# Patient Record
Sex: Female | Born: 1950 | Race: Black or African American | Hispanic: No | Marital: Married | Smoking: Never smoker
Health system: Southern US, Community
[De-identification: ages and names within clinical notes are randomized; demographics above are authoritative.]

## PROBLEM LIST (undated history)

## (undated) DIAGNOSIS — R350 Frequency of micturition: Secondary | ICD-10-CM

## (undated) DIAGNOSIS — R112 Nausea with vomiting, unspecified: Secondary | ICD-10-CM

## (undated) DIAGNOSIS — B370 Candidal stomatitis: Secondary | ICD-10-CM

## (undated) DIAGNOSIS — Z923 Personal history of irradiation: Secondary | ICD-10-CM

## (undated) DIAGNOSIS — R809 Proteinuria, unspecified: Secondary | ICD-10-CM

## (undated) DIAGNOSIS — M7071 Other bursitis of hip, right hip: Secondary | ICD-10-CM

## (undated) DIAGNOSIS — E7800 Pure hypercholesterolemia, unspecified: Secondary | ICD-10-CM

## (undated) DIAGNOSIS — C801 Malignant (primary) neoplasm, unspecified: Secondary | ICD-10-CM

## (undated) DIAGNOSIS — C50919 Malignant neoplasm of unspecified site of unspecified female breast: Secondary | ICD-10-CM

## (undated) DIAGNOSIS — M25561 Pain in right knee: Secondary | ICD-10-CM

## (undated) DIAGNOSIS — E876 Hypokalemia: Secondary | ICD-10-CM

## (undated) DIAGNOSIS — I10 Essential (primary) hypertension: Secondary | ICD-10-CM

## (undated) DIAGNOSIS — E119 Type 2 diabetes mellitus without complications: Secondary | ICD-10-CM

## (undated) DIAGNOSIS — E2839 Other primary ovarian failure: Secondary | ICD-10-CM

## (undated) DIAGNOSIS — Z9889 Other specified postprocedural states: Secondary | ICD-10-CM

## (undated) DIAGNOSIS — R011 Cardiac murmur, unspecified: Secondary | ICD-10-CM

## (undated) DIAGNOSIS — T4145XA Adverse effect of unspecified anesthetic, initial encounter: Secondary | ICD-10-CM

## (undated) DIAGNOSIS — J309 Allergic rhinitis, unspecified: Secondary | ICD-10-CM

## (undated) DIAGNOSIS — R5383 Other fatigue: Secondary | ICD-10-CM

## (undated) DIAGNOSIS — M25512 Pain in left shoulder: Secondary | ICD-10-CM

## (undated) DIAGNOSIS — M79672 Pain in left foot: Secondary | ICD-10-CM

## (undated) DIAGNOSIS — M722 Plantar fascial fibromatosis: Secondary | ICD-10-CM

## (undated) DIAGNOSIS — M545 Low back pain, unspecified: Secondary | ICD-10-CM

## (undated) DIAGNOSIS — M1990 Unspecified osteoarthritis, unspecified site: Secondary | ICD-10-CM

## (undated) DIAGNOSIS — N189 Chronic kidney disease, unspecified: Secondary | ICD-10-CM

## (undated) DIAGNOSIS — R42 Dizziness and giddiness: Secondary | ICD-10-CM

## (undated) DIAGNOSIS — H6120 Impacted cerumen, unspecified ear: Secondary | ICD-10-CM

## (undated) DIAGNOSIS — T8859XA Other complications of anesthesia, initial encounter: Secondary | ICD-10-CM

## (undated) DIAGNOSIS — M109 Gout, unspecified: Secondary | ICD-10-CM

## (undated) HISTORY — DX: Dizziness and giddiness: R42

## (undated) HISTORY — DX: Allergic rhinitis, unspecified: J30.9

## (undated) HISTORY — DX: Proteinuria, unspecified: R80.9

## (undated) HISTORY — DX: Pain in right knee: M25.561

## (undated) HISTORY — DX: Other primary ovarian failure: E28.39

## (undated) HISTORY — DX: Low back pain, unspecified: M54.50

## (undated) HISTORY — DX: Other bursitis of hip, right hip: M70.71

## (undated) HISTORY — PX: JOINT REPLACEMENT: SHX530

## (undated) HISTORY — PX: TONSILLECTOMY: SUR1361

## (undated) HISTORY — PX: COLONOSCOPY: SHX174

## (undated) HISTORY — DX: Frequency of micturition: R35.0

## (undated) HISTORY — DX: Other fatigue: R53.83

## (undated) HISTORY — DX: Pain in left shoulder: M25.512

## (undated) HISTORY — PX: BREAST SURGERY: SHX581

## (undated) HISTORY — DX: Impacted cerumen, unspecified ear: H61.20

## (undated) HISTORY — DX: Hypokalemia: E87.6

## (undated) HISTORY — DX: Pure hypercholesterolemia, unspecified: E78.00

## (undated) HISTORY — DX: Low back pain: M54.5

## (undated) HISTORY — PX: APPENDECTOMY: SHX54

## (undated) HISTORY — PX: TUBAL LIGATION: SHX77

## (undated) HISTORY — PX: CHOLECYSTECTOMY: SHX55

## (undated) HISTORY — DX: Plantar fascial fibromatosis: M72.2

## (undated) HISTORY — DX: Pain in left foot: M79.672

## (undated) HISTORY — PX: ABDOMINAL HYSTERECTOMY: SHX81

## (undated) HISTORY — DX: Candidal stomatitis: B37.0

---

## 1997-10-11 HISTORY — PX: LAPAROSCOPY: SHX197

## 2004-06-01 ENCOUNTER — Encounter (HOSPITAL_COMMUNITY): Admission: RE | Admit: 2004-06-01 | Discharge: 2004-06-16 | Payer: Self-pay | Admitting: General Surgery

## 2004-12-01 ENCOUNTER — Ambulatory Visit: Admission: RE | Admit: 2004-12-01 | Discharge: 2005-02-02 | Payer: Self-pay | Admitting: Radiation Oncology

## 2013-10-11 HISTORY — PX: BREAST LUMPECTOMY: SHX2

## 2015-10-14 DIAGNOSIS — E2839 Other primary ovarian failure: Secondary | ICD-10-CM | POA: Diagnosis not present

## 2015-10-21 DIAGNOSIS — E1165 Type 2 diabetes mellitus with hyperglycemia: Secondary | ICD-10-CM | POA: Diagnosis not present

## 2015-10-21 DIAGNOSIS — Z6833 Body mass index (BMI) 33.0-33.9, adult: Secondary | ICD-10-CM | POA: Diagnosis not present

## 2015-10-21 DIAGNOSIS — M76899 Other specified enthesopathies of unspecified lower limb, excluding foot: Secondary | ICD-10-CM | POA: Diagnosis not present

## 2015-10-21 DIAGNOSIS — E7800 Pure hypercholesterolemia, unspecified: Secondary | ICD-10-CM | POA: Diagnosis not present

## 2015-10-21 DIAGNOSIS — Z789 Other specified health status: Secondary | ICD-10-CM | POA: Diagnosis not present

## 2015-12-05 DIAGNOSIS — E7800 Pure hypercholesterolemia, unspecified: Secondary | ICD-10-CM | POA: Diagnosis not present

## 2015-12-05 DIAGNOSIS — R6889 Other general symptoms and signs: Secondary | ICD-10-CM | POA: Diagnosis not present

## 2015-12-05 DIAGNOSIS — J029 Acute pharyngitis, unspecified: Secondary | ICD-10-CM | POA: Diagnosis not present

## 2015-12-18 DIAGNOSIS — R350 Frequency of micturition: Secondary | ICD-10-CM | POA: Diagnosis not present

## 2015-12-18 DIAGNOSIS — M7061 Trochanteric bursitis, right hip: Secondary | ICD-10-CM | POA: Diagnosis not present

## 2015-12-18 DIAGNOSIS — M47814 Spondylosis without myelopathy or radiculopathy, thoracic region: Secondary | ICD-10-CM | POA: Diagnosis not present

## 2015-12-18 DIAGNOSIS — M4316 Spondylolisthesis, lumbar region: Secondary | ICD-10-CM | POA: Diagnosis not present

## 2015-12-18 DIAGNOSIS — M549 Dorsalgia, unspecified: Secondary | ICD-10-CM | POA: Diagnosis not present

## 2015-12-18 DIAGNOSIS — N182 Chronic kidney disease, stage 2 (mild): Secondary | ICD-10-CM | POA: Diagnosis not present

## 2015-12-18 DIAGNOSIS — M5410 Radiculopathy, site unspecified: Secondary | ICD-10-CM | POA: Diagnosis not present

## 2015-12-18 DIAGNOSIS — I10 Essential (primary) hypertension: Secondary | ICD-10-CM | POA: Diagnosis not present

## 2015-12-18 DIAGNOSIS — M160 Bilateral primary osteoarthritis of hip: Secondary | ICD-10-CM | POA: Diagnosis not present

## 2015-12-18 DIAGNOSIS — M5116 Intervertebral disc disorders with radiculopathy, lumbar region: Secondary | ICD-10-CM | POA: Diagnosis not present

## 2015-12-18 DIAGNOSIS — E1165 Type 2 diabetes mellitus with hyperglycemia: Secondary | ICD-10-CM | POA: Diagnosis not present

## 2016-01-26 DIAGNOSIS — I10 Essential (primary) hypertension: Secondary | ICD-10-CM | POA: Diagnosis not present

## 2016-01-26 DIAGNOSIS — M1710 Unilateral primary osteoarthritis, unspecified knee: Secondary | ICD-10-CM | POA: Diagnosis not present

## 2016-01-26 DIAGNOSIS — R202 Paresthesia of skin: Secondary | ICD-10-CM | POA: Diagnosis not present

## 2016-01-26 DIAGNOSIS — E1165 Type 2 diabetes mellitus with hyperglycemia: Secondary | ICD-10-CM | POA: Diagnosis not present

## 2016-01-26 DIAGNOSIS — M549 Dorsalgia, unspecified: Secondary | ICD-10-CM | POA: Diagnosis not present

## 2016-02-03 DIAGNOSIS — E7800 Pure hypercholesterolemia, unspecified: Secondary | ICD-10-CM | POA: Diagnosis not present

## 2016-02-03 DIAGNOSIS — I10 Essential (primary) hypertension: Secondary | ICD-10-CM | POA: Diagnosis not present

## 2016-02-03 DIAGNOSIS — E119 Type 2 diabetes mellitus without complications: Secondary | ICD-10-CM | POA: Diagnosis not present

## 2016-02-03 DIAGNOSIS — I2510 Atherosclerotic heart disease of native coronary artery without angina pectoris: Secondary | ICD-10-CM | POA: Diagnosis not present

## 2016-02-10 DIAGNOSIS — M5431 Sciatica, right side: Secondary | ICD-10-CM | POA: Diagnosis not present

## 2016-02-10 DIAGNOSIS — M545 Low back pain: Secondary | ICD-10-CM | POA: Diagnosis not present

## 2016-02-10 DIAGNOSIS — M549 Dorsalgia, unspecified: Secondary | ICD-10-CM | POA: Diagnosis not present

## 2016-02-12 DIAGNOSIS — M5431 Sciatica, right side: Secondary | ICD-10-CM | POA: Diagnosis not present

## 2016-02-12 DIAGNOSIS — M545 Low back pain: Secondary | ICD-10-CM | POA: Diagnosis not present

## 2016-02-12 DIAGNOSIS — M549 Dorsalgia, unspecified: Secondary | ICD-10-CM | POA: Diagnosis not present

## 2016-02-16 DIAGNOSIS — M549 Dorsalgia, unspecified: Secondary | ICD-10-CM | POA: Diagnosis not present

## 2016-02-16 DIAGNOSIS — M545 Low back pain: Secondary | ICD-10-CM | POA: Diagnosis not present

## 2016-02-16 DIAGNOSIS — M5431 Sciatica, right side: Secondary | ICD-10-CM | POA: Diagnosis not present

## 2016-02-18 DIAGNOSIS — M549 Dorsalgia, unspecified: Secondary | ICD-10-CM | POA: Diagnosis not present

## 2016-02-18 DIAGNOSIS — M5431 Sciatica, right side: Secondary | ICD-10-CM | POA: Diagnosis not present

## 2016-02-18 DIAGNOSIS — M545 Low back pain: Secondary | ICD-10-CM | POA: Diagnosis not present

## 2016-02-20 DIAGNOSIS — M549 Dorsalgia, unspecified: Secondary | ICD-10-CM | POA: Diagnosis not present

## 2016-02-20 DIAGNOSIS — M545 Low back pain: Secondary | ICD-10-CM | POA: Diagnosis not present

## 2016-02-20 DIAGNOSIS — M5431 Sciatica, right side: Secondary | ICD-10-CM | POA: Diagnosis not present

## 2016-02-24 DIAGNOSIS — H43393 Other vitreous opacities, bilateral: Secondary | ICD-10-CM | POA: Diagnosis not present

## 2016-02-25 DIAGNOSIS — M545 Low back pain: Secondary | ICD-10-CM | POA: Diagnosis not present

## 2016-02-25 DIAGNOSIS — M549 Dorsalgia, unspecified: Secondary | ICD-10-CM | POA: Diagnosis not present

## 2016-02-25 DIAGNOSIS — M5431 Sciatica, right side: Secondary | ICD-10-CM | POA: Diagnosis not present

## 2016-03-01 DIAGNOSIS — M545 Low back pain: Secondary | ICD-10-CM | POA: Diagnosis not present

## 2016-03-01 DIAGNOSIS — E1122 Type 2 diabetes mellitus with diabetic chronic kidney disease: Secondary | ICD-10-CM | POA: Diagnosis not present

## 2016-03-01 DIAGNOSIS — N183 Chronic kidney disease, stage 3 (moderate): Secondary | ICD-10-CM | POA: Diagnosis not present

## 2016-03-01 DIAGNOSIS — Z299 Encounter for prophylactic measures, unspecified: Secondary | ICD-10-CM | POA: Diagnosis not present

## 2016-03-03 DIAGNOSIS — M545 Low back pain: Secondary | ICD-10-CM | POA: Diagnosis not present

## 2016-03-03 DIAGNOSIS — M5431 Sciatica, right side: Secondary | ICD-10-CM | POA: Diagnosis not present

## 2016-03-03 DIAGNOSIS — M549 Dorsalgia, unspecified: Secondary | ICD-10-CM | POA: Diagnosis not present

## 2016-03-04 DIAGNOSIS — M4806 Spinal stenosis, lumbar region: Secondary | ICD-10-CM | POA: Diagnosis not present

## 2016-03-04 DIAGNOSIS — M4316 Spondylolisthesis, lumbar region: Secondary | ICD-10-CM | POA: Diagnosis not present

## 2016-03-09 DIAGNOSIS — M549 Dorsalgia, unspecified: Secondary | ICD-10-CM | POA: Diagnosis not present

## 2016-03-09 DIAGNOSIS — M1710 Unilateral primary osteoarthritis, unspecified knee: Secondary | ICD-10-CM | POA: Diagnosis not present

## 2016-03-26 DIAGNOSIS — M170 Bilateral primary osteoarthritis of knee: Secondary | ICD-10-CM | POA: Diagnosis not present

## 2016-03-31 DIAGNOSIS — I10 Essential (primary) hypertension: Secondary | ICD-10-CM | POA: Diagnosis not present

## 2016-03-31 DIAGNOSIS — I2510 Atherosclerotic heart disease of native coronary artery without angina pectoris: Secondary | ICD-10-CM | POA: Diagnosis not present

## 2016-03-31 DIAGNOSIS — E7800 Pure hypercholesterolemia, unspecified: Secondary | ICD-10-CM | POA: Diagnosis not present

## 2016-03-31 DIAGNOSIS — E119 Type 2 diabetes mellitus without complications: Secondary | ICD-10-CM | POA: Diagnosis not present

## 2016-04-14 DIAGNOSIS — E1122 Type 2 diabetes mellitus with diabetic chronic kidney disease: Secondary | ICD-10-CM | POA: Diagnosis not present

## 2016-04-14 DIAGNOSIS — N183 Chronic kidney disease, stage 3 (moderate): Secondary | ICD-10-CM | POA: Diagnosis not present

## 2016-04-14 DIAGNOSIS — M1710 Unilateral primary osteoarthritis, unspecified knee: Secondary | ICD-10-CM | POA: Diagnosis not present

## 2016-04-14 DIAGNOSIS — Z299 Encounter for prophylactic measures, unspecified: Secondary | ICD-10-CM | POA: Diagnosis not present

## 2016-04-14 DIAGNOSIS — I10 Essential (primary) hypertension: Secondary | ICD-10-CM | POA: Diagnosis not present

## 2016-04-15 ENCOUNTER — Ambulatory Visit: Payer: Self-pay | Admitting: Orthopedic Surgery

## 2016-04-22 DIAGNOSIS — M1711 Unilateral primary osteoarthritis, right knee: Secondary | ICD-10-CM | POA: Diagnosis not present

## 2016-04-27 ENCOUNTER — Ambulatory Visit: Payer: Self-pay | Admitting: Orthopedic Surgery

## 2016-04-27 NOTE — H&P (Signed)
TOTAL KNEE ADMISSION H&P  Patient is being admitted for right total knee arthroplasty.  Subjective:  Chief Complaint:right knee pain.  HPI: Carol Keith, 65 y.o. female, has a history of pain and functional disability in the right knee due to arthritis and has failed non-surgical conservative treatments for greater than 12 weeks to includeNSAID's and/or analgesics, corticosteriod injections, flexibility and strengthening excercises, use of assistive devices, weight reduction as appropriate and activity modification.  Onset of symptoms was gradual, starting >10 years ago with gradually worsening course since that time. The patient noted no past surgery on the right knee(s).  Patient currently rates pain in the bilaterally knee(s) at 10 out of 10 with activity. Patient has night pain, worsening of pain with activity and weight bearing, pain that interferes with activities of daily living, pain with passive range of motion, crepitus and joint swelling.  Patient has evidence of subchondral cysts, subchondral sclerosis, periarticular osteophytes and joint space narrowing by imaging studies. There is no active infection.  There are no active problems to display for this patient.  No past medical history on file.  No past surgical history on file.   (Not in a hospital admission) Allergies not on file  Social History  Substance Use Topics  . Smoking status: Not on file  . Smokeless tobacco: Not on file  . Alcohol Use: Not on file    No family history on file.   Review of Systems  Constitutional: Positive for diaphoresis.  Eyes: Positive for blurred vision.  Respiratory: Negative.   Cardiovascular: Negative.   Gastrointestinal: Negative.   Genitourinary: Negative.   Musculoskeletal: Positive for back pain and joint pain.  Skin: Negative.   Neurological: Positive for dizziness.  Endo/Heme/Allergies: Negative.   Psychiatric/Behavioral: Negative.     Objective:  Physical Exam   Vitals reviewed. Constitutional: She is oriented to person, place, and time. She appears well-developed and well-nourished.  HENT:  Head: Normocephalic and atraumatic.  Eyes: Conjunctivae and EOM are normal. Pupils are equal, round, and reactive to light.  Neck: Normal range of motion. Neck supple.  Cardiovascular: Normal rate and regular rhythm.   Respiratory: Effort normal. No respiratory distress.  GI: Soft. Bowel sounds are normal. She exhibits no distension.  Genitourinary:  deferred  Musculoskeletal:       Right knee: She exhibits swelling, effusion and deformity. Tenderness found. Medial joint line tenderness noted.  ROM 15-105  Neurological: She is alert and oriented to person, place, and time. She has normal reflexes.  Skin: Skin is warm.  Psychiatric: She has a normal mood and affect. Her behavior is normal. Judgment and thought content normal.    Vital signs in last 24 hours: @VSRANGES @  Labs:   There is no height or weight on file to calculate BMI.   Imaging Review Plain radiographs demonstrate severe degenerative joint disease of the bilaterally knee(s). The overall alignment issignificant varus. The bone quality appears to be adequate for age and reported activity level.  Assessment/Plan:  End stage arthritis, right knee   The patient history, physical examination, clinical judgment of the provider and imaging studies are consistent with end stage degenerative joint disease of the right knee(s) and total knee arthroplasty is deemed medically necessary. The treatment options including medical management, injection therapy arthroscopy and arthroplasty were discussed at length. The risks and benefits of total knee arthroplasty were presented and reviewed. The risks due to aseptic loosening, infection, stiffness, patella tracking problems, thromboembolic complications and other imponderables were discussed. The patient  acknowledged the explanation, agreed to proceed  with the plan and consent was signed. Patient is being admitted for inpatient treatment for surgery, pain control, PT, OT, prophylactic antibiotics, VTE prophylaxis, progressive ambulation and ADL's and discharge planning. The patient is planning to be discharged home with home health services

## 2016-04-29 DIAGNOSIS — I10 Essential (primary) hypertension: Secondary | ICD-10-CM | POA: Diagnosis not present

## 2016-04-29 DIAGNOSIS — E1122 Type 2 diabetes mellitus with diabetic chronic kidney disease: Secondary | ICD-10-CM | POA: Diagnosis not present

## 2016-04-29 DIAGNOSIS — N183 Chronic kidney disease, stage 3 (moderate): Secondary | ICD-10-CM | POA: Diagnosis not present

## 2016-05-06 ENCOUNTER — Encounter (HOSPITAL_COMMUNITY)
Admission: RE | Admit: 2016-05-06 | Discharge: 2016-05-06 | Disposition: A | Discharge status: Home / Self Care | Payer: Medicare Other | Source: Ambulatory Visit | Attending: Orthopedic Surgery | Admitting: Orthopedic Surgery

## 2016-05-06 ENCOUNTER — Encounter (HOSPITAL_COMMUNITY): Payer: Self-pay

## 2016-05-06 ENCOUNTER — Other Ambulatory Visit: Payer: Self-pay

## 2016-05-06 DIAGNOSIS — Z0183 Encounter for blood typing: Secondary | ICD-10-CM | POA: Diagnosis not present

## 2016-05-06 DIAGNOSIS — Z79899 Other long term (current) drug therapy: Secondary | ICD-10-CM | POA: Insufficient documentation

## 2016-05-06 DIAGNOSIS — I129 Hypertensive chronic kidney disease with stage 1 through stage 4 chronic kidney disease, or unspecified chronic kidney disease: Secondary | ICD-10-CM | POA: Diagnosis not present

## 2016-05-06 DIAGNOSIS — Z853 Personal history of malignant neoplasm of breast: Secondary | ICD-10-CM | POA: Insufficient documentation

## 2016-05-06 DIAGNOSIS — Z01812 Encounter for preprocedural laboratory examination: Secondary | ICD-10-CM | POA: Insufficient documentation

## 2016-05-06 DIAGNOSIS — N189 Chronic kidney disease, unspecified: Secondary | ICD-10-CM | POA: Insufficient documentation

## 2016-05-06 DIAGNOSIS — E1122 Type 2 diabetes mellitus with diabetic chronic kidney disease: Secondary | ICD-10-CM | POA: Diagnosis not present

## 2016-05-06 DIAGNOSIS — Z7982 Long term (current) use of aspirin: Secondary | ICD-10-CM | POA: Diagnosis not present

## 2016-05-06 DIAGNOSIS — M1711 Unilateral primary osteoarthritis, right knee: Secondary | ICD-10-CM | POA: Diagnosis not present

## 2016-05-06 DIAGNOSIS — Z794 Long term (current) use of insulin: Secondary | ICD-10-CM | POA: Insufficient documentation

## 2016-05-06 DIAGNOSIS — Z01818 Encounter for other preprocedural examination: Secondary | ICD-10-CM | POA: Diagnosis not present

## 2016-05-06 HISTORY — DX: Other specified postprocedural states: R11.2

## 2016-05-06 HISTORY — DX: Malignant (primary) neoplasm, unspecified: C80.1

## 2016-05-06 HISTORY — DX: Unspecified osteoarthritis, unspecified site: M19.90

## 2016-05-06 HISTORY — DX: Other complications of anesthesia, initial encounter: T88.59XA

## 2016-05-06 HISTORY — DX: Cardiac murmur, unspecified: R01.1

## 2016-05-06 HISTORY — DX: Chronic kidney disease, unspecified: N18.9

## 2016-05-06 HISTORY — DX: Type 2 diabetes mellitus without complications: E11.9

## 2016-05-06 HISTORY — DX: Adverse effect of unspecified anesthetic, initial encounter: T41.45XA

## 2016-05-06 HISTORY — DX: Other specified postprocedural states: Z98.890

## 2016-05-06 HISTORY — DX: Essential (primary) hypertension: I10

## 2016-05-06 LAB — CBC
HEMATOCRIT: 34.1 % — AB (ref 36.0–46.0)
Hemoglobin: 11.4 g/dL — ABNORMAL LOW (ref 12.0–15.0)
MCH: 28.8 pg (ref 26.0–34.0)
MCHC: 33.4 g/dL (ref 30.0–36.0)
MCV: 86.1 fL (ref 78.0–100.0)
PLATELETS: 267 10*3/uL (ref 150–400)
RBC: 3.96 MIL/uL (ref 3.87–5.11)
RDW: 13.3 % (ref 11.5–15.5)
WBC: 6.3 10*3/uL (ref 4.0–10.5)

## 2016-05-06 LAB — BASIC METABOLIC PANEL
Anion gap: 8 (ref 5–15)
BUN: 25 mg/dL — AB (ref 6–20)
CHLORIDE: 103 mmol/L (ref 101–111)
CO2: 29 mmol/L (ref 22–32)
CREATININE: 1.29 mg/dL — AB (ref 0.44–1.00)
Calcium: 10.3 mg/dL (ref 8.9–10.3)
GFR calc non Af Amer: 42 mL/min — ABNORMAL LOW (ref >60)
GFR, EST AFRICAN AMERICAN: 49 mL/min — AB (ref >60)
Glucose, Bld: 166 mg/dL — ABNORMAL HIGH (ref 65–99)
POTASSIUM: 2.7 mmol/L — AB (ref 3.5–5.1)
Sodium: 140 mmol/L (ref 135–145)

## 2016-05-06 LAB — TYPE AND SCREEN
ABO/RH(D): O POS
ANTIBODY SCREEN: NEGATIVE

## 2016-05-06 LAB — ABO/RH: ABO/RH(D): O POS

## 2016-05-06 LAB — SURGICAL PCR SCREEN
MRSA, PCR: NEGATIVE
Staphylococcus aureus: NEGATIVE

## 2016-05-06 LAB — GLUCOSE, CAPILLARY: Glucose-Capillary: 186 mg/dL — ABNORMAL HIGH (ref 65–99)

## 2016-05-06 NOTE — Progress Notes (Signed)
Pt. Reports that she had an ECHO relative to a heart murmur & rec'ing chemo.  Pt. Told that the results were normal & there was no need to followup with cardiology

## 2016-05-06 NOTE — Pre-Procedure Instructions (Signed)
Carol Keith  05/06/2016     No Pharmacies Listed   Your procedure is scheduled on 05/17/2016   MONDAY  Report to [facility]  Admitting at 9:45 A.M.  Call this number if you have problems the morning of surgery:  [phone]   Remember:  Do not eat food or drink liquids after midnight.  Take these medicines the morning of surgery with A SIP OF WATER : Amlodipine, Zyrtec & Metoprolol    Do not wear jewelry, make-up or nail polish.   Do not wear lotions, powders, or perfumes.  You may wear deoderant.   Do not shave 48 hours prior to surgery.     Do not bring valuables to the hospital.   [facility] is not responsible for any belongings or valuables.  Contacts, dentures or bridgework may not be worn into surgery.  Leave your suitcase in the car.  After surgery it may be brought to your room.  For patients admitted to the hospital, discharge time will be determined by your treatment team.  Patients discharged the day of surgery will not be allowed to drive home.   Name and phone number of your driver:   With family  Special instructions:     How to Manage Your Diabetes Before and After Surgery  Why is it important to control my blood sugar before and after surgery? . Improving blood sugar levels before and after surgery helps healing and can limit problems. . A way of improving blood sugar control is eating a healthy diet by: o  Eating less sugar and carbohydrates o  Increasing activity/exercise o  Talking with your doctor about reaching your blood sugar goals . High blood sugars (greater than 180 mg/dL) can raise your risk of infections and slow your recovery, so you will need to focus on controlling your diabetes during the weeks before surgery. . Make sure that the doctor who takes care of your diabetes knows about your planned surgery including the date and location.  How do I manage my blood sugar before surgery? . Check your blood sugar at least  4 times a day, starting 2 days before surgery, to make sure that the level is not too high or low. o Check your blood sugar the morning of your surgery when you wake up and every 2 hours until you get to the Short Stay unit. . If your blood sugar is less than 70 mg/dL, you will need to treat for low blood sugar: o Do not take insulin. o Treat a low blood sugar (less than 70 mg/dL) with  cup of clear juice (cranberry or apple), 4 glucose tablets, OR glucose gel. o Recheck blood sugar in 15 minutes after treatment (to make sure it is greater than 70 mg/dL). If your blood sugar is not greater than 70 mg/dL on recheck, call [phone] for further instructions. . Report your blood sugar to the short stay nurse when you get to Short Stay.  . If you are admitted to the hospital after surgery: o Your blood sugar will be checked by the staff and you will probably be given insulin after surgery (instead of oral diabetes medicines) to make sure you have good blood sugar levels. o The goal for blood sugar control after surgery is 80-180 mg/dL.              WHAT DO I DO ABOUT MY DIABETES MEDICATION?   Marland Kitchen Do not take oral diabetes medicines (pills) the  morning of surgery.   DO NOT TAKE ANY BYETTA THE DAY OF SURGERY .  . THE NIGHT BEFORE SURGERY, take _22__________ units of ___LEVEMIR________insulin.       .   . The day of surgery, do not take other diabetes injectables, including Byetta (exenatide), Bydureon (exenatide ER), Victoza (liraglutide), or Trulicity (dulaglutide).  .   Other Instructions: Special Instructions: [place] - Preparing for Surgery  Before surgery, you can play an important role.  Because skin is not sterile, your skin needs to be as free of germs as possible.  You can reduce the number of germs on you skin by washing with CHG (chlorahexidine gluconate) soap before surgery.  CHG is an antiseptic cleaner which kills germs and bonds with the skin to continue killing  germs even after washing.  Please DO NOT use if you have an allergy to CHG or antibacterial soaps.  If your skin becomes reddened/irritated stop using the CHG and inform your nurse when you arrive at Short Stay.  Do not shave (including legs and underarms) for at least 48 hours prior to the first CHG shower.  You may shave your face.  Please follow these instructions carefully:   1.  Shower with CHG Soap the night before surgery and the  morning of Surgery.  2.  If you choose to wash your hair, wash your hair first as usual with your  normal shampoo.  3.  After you shampoo, rinse your hair and body thoroughly to remove the  Shampoo.  4.  Use CHG as you would any other liquid soap.  You can apply chg directly to the skin and wash gently with scrungie or a clean washcloth.  5.  Apply the CHG Soap to your body ONLY FROM THE NECK DOWN.    Do not use on open wounds or open sores.  Avoid contact with your eyes, ears, mouth and genitals (private parts).  Wash genitals (private parts)   with your normal soap.  6.  Wash thoroughly, paying special attention to the area where your surgery will be performed.  7.  Thoroughly rinse your body with warm water from the neck down.  8.  DO NOT shower/wash with your normal soap after using and rinsing off   the CHG Soap.  9.  Pat yourself dry with a clean towel.            10.  Wear clean pajamas.            11.  Place clean sheets on your bed the night of your first shower and do not sleep with pets.  Day of Surgery  Do not apply any lotions/deodorants the morning of surgery.  Please wear clean clothes to the hospital/surgery center.          Patient Signature:  Date:   Nurse Signature:  Date:   Reviewed and Endorsed by [facility] Patient Education Committee, August 2015  Please read over the following fact sheets that you were given. Pain Booklet, Coughing and Deep Breathing, Total Joint Packet, MRSA Information and Surgical Site Infection  Prevention

## 2016-05-06 NOTE — Progress Notes (Signed)
Pt. Reports that Dr. Manuella Ghazi in [place] is her PCP.  Pt. Is requesting  for kidney consult in early Aug. Upon her request, she will be seeing Dr. Berton Bon. Pt. Denies cardiac needs or pulm. needs, she reports that she had an ECHO while or before Chemo tx in 2005 & it was normal.  She thinks she had it at [facility].

## 2016-05-07 DIAGNOSIS — M47816 Spondylosis without myelopathy or radiculopathy, lumbar region: Secondary | ICD-10-CM | POA: Diagnosis not present

## 2016-05-07 DIAGNOSIS — M4806 Spinal stenosis, lumbar region: Secondary | ICD-10-CM | POA: Diagnosis not present

## 2016-05-07 DIAGNOSIS — M4317 Spondylolisthesis, lumbosacral region: Secondary | ICD-10-CM | POA: Diagnosis not present

## 2016-05-07 LAB — HEMOGLOBIN A1C
HEMOGLOBIN A1C: 7.2 % — AB (ref 4.8–5.6)
Mean Plasma Glucose: 160 mg/dL

## 2016-05-07 NOTE — Progress Notes (Addendum)
I called a potassium of 2.7 to Dr Lyla Glassing, Message was given to Santiago Glad- call center. I requested reports from PCP- Dr Manuella Ghazi in [place], [place].

## 2016-05-10 DIAGNOSIS — I739 Peripheral vascular disease, unspecified: Secondary | ICD-10-CM | POA: Diagnosis not present

## 2016-05-10 DIAGNOSIS — E876 Hypokalemia: Secondary | ICD-10-CM | POA: Diagnosis not present

## 2016-05-10 DIAGNOSIS — C50919 Malignant neoplasm of unspecified site of unspecified female breast: Secondary | ICD-10-CM | POA: Diagnosis not present

## 2016-05-10 NOTE — Progress Notes (Addendum)
Anesthesia Chart Review:  Pt is a 65 year old female scheduled for R total knee arthroplasty on 05/17/2016 with Dot Lanes, MD.   PCP is Monico Blitz, MD who has cleared pt for surgery.   PMH includes:  HTN, heart murmur, DM, CKD, breast cancer, post-op N/V. Never smoker. BMI 32.5  Medications include: amlodipine, ASA, exenatide, glimepiride, levemir, losartan-hctz, metoprolol, potassium  Preoperative labs reviewed.   - HgbA1c 7.2, glucose 166.  - K 2.7. Dr. Noralyn Pick office was notified by PAT RN, potassium dose was increased. Will recheck K DOS.   EKG 05/06/16: NSR.   If labs acceptable DOS, I anticipate pt can proceed as scheduled.   Willeen Cass, FNP-BC [facility]/Anesthesiology Phone: [phone] 05/11/2016 12:51 PM

## 2016-05-11 DIAGNOSIS — E876 Hypokalemia: Secondary | ICD-10-CM | POA: Diagnosis not present

## 2016-05-11 DIAGNOSIS — I10 Essential (primary) hypertension: Secondary | ICD-10-CM | POA: Diagnosis not present

## 2016-05-11 DIAGNOSIS — E1129 Type 2 diabetes mellitus with other diabetic kidney complication: Secondary | ICD-10-CM | POA: Diagnosis not present

## 2016-05-11 DIAGNOSIS — R609 Edema, unspecified: Secondary | ICD-10-CM | POA: Diagnosis not present

## 2016-05-11 DIAGNOSIS — N179 Acute kidney failure, unspecified: Secondary | ICD-10-CM | POA: Diagnosis not present

## 2016-05-11 DIAGNOSIS — M109 Gout, unspecified: Secondary | ICD-10-CM | POA: Diagnosis not present

## 2016-05-14 DIAGNOSIS — Z79899 Other long term (current) drug therapy: Secondary | ICD-10-CM | POA: Diagnosis not present

## 2016-05-14 DIAGNOSIS — I129 Hypertensive chronic kidney disease with stage 1 through stage 4 chronic kidney disease, or unspecified chronic kidney disease: Secondary | ICD-10-CM | POA: Diagnosis not present

## 2016-05-14 DIAGNOSIS — N182 Chronic kidney disease, stage 2 (mild): Secondary | ICD-10-CM | POA: Diagnosis not present

## 2016-05-16 MED ORDER — TRANEXAMIC ACID 1000 MG/10ML IV SOLN
1000.0000 mg | INTRAVENOUS | Status: AC
  Administered 2016-05-17: 1000 mg via INTRAVENOUS
  Filled 2016-05-16: qty 10

## 2016-05-16 NOTE — Anesthesia Preprocedure Evaluation (Addendum)
Anesthesia Evaluation  Patient identified by MRN, date of birth, ID band Patient awake    Reviewed: Allergy & Precautions, NPO status , Patient's Chart, lab work & pertinent test results, reviewed documented beta blocker date and time   History of Anesthesia Complications (+) PONV and history of anesthetic complications  Airway Mallampati: I  TM Distance: >3 FB Neck ROM: Full    Dental  (+) Partial Lower, Partial Upper, Dental Advisory Given   Pulmonary neg pulmonary ROS, neg shortness of breath, neg sleep apnea, neg COPD, neg recent URI,    Pulmonary exam normal breath sounds clear to auscultation       Cardiovascular hypertension, Pt. on medications (-) angina(-) Past MI, (-) Cardiac Stents and (-) Orthopnea (-) pacemaker+ Valvular Problems/Murmurs  Rhythm:Regular Rate:Normal + Systolic murmurs    Neuro/Psych neg Seizures negative neurological ROS  negative psych ROS   GI/Hepatic negative GI ROS, Neg liver ROS,   Endo/Other  diabetes, Well Controlled, Type 2, Insulin Dependent, Oral Hypoglycemic Agents  Renal/GU CRFRenal disease     Musculoskeletal  (+) Arthritis ,   Abdominal (+) - obese,   Peds  Hematology   Anesthesia Other Findings H/o breast cancer  Reproductive/Obstetrics                            Anesthesia Physical Anesthesia Plan  ASA: III  Anesthesia Plan: Spinal and Regional   Post-op Pain Management:  Regional for Post-op pain   Induction: Intravenous  Airway Management Planned: Oral ETT  Additional Equipment:   Intra-op Plan:   Post-operative Plan:   Informed Consent: I have reviewed the patients History and Physical, chart, labs and discussed the procedure including the risks, benefits and alternatives for the proposed anesthesia with the patient or authorized representative who has indicated his/her understanding and acceptance.   Dental advisory given  Plan  Discussed with:   Anesthesia Plan Comments:       Anesthesia Quick Evaluation

## 2016-05-17 ENCOUNTER — Inpatient Hospital Stay (HOSPITAL_COMMUNITY)
Admission: RE | Admit: 2016-05-17 | Discharge: 2016-05-18 | DRG: 470 | Disposition: A | Discharge status: Home / Self Care | Payer: Medicare Other | Source: Ambulatory Visit | Attending: Orthopedic Surgery | Admitting: Orthopedic Surgery

## 2016-05-17 ENCOUNTER — Ambulatory Visit (HOSPITAL_COMMUNITY): Payer: Medicare Other | Admitting: Emergency Medicine

## 2016-05-17 ENCOUNTER — Encounter (HOSPITAL_COMMUNITY)
Admission: RE | Disposition: A | Discharge status: Home / Self Care | Payer: Self-pay | Source: Ambulatory Visit | Attending: Orthopedic Surgery

## 2016-05-17 ENCOUNTER — Inpatient Hospital Stay (HOSPITAL_COMMUNITY): Payer: Medicare Other

## 2016-05-17 ENCOUNTER — Encounter (HOSPITAL_COMMUNITY): Payer: Self-pay | Admitting: *Deleted

## 2016-05-17 DIAGNOSIS — G8918 Other acute postprocedural pain: Secondary | ICD-10-CM | POA: Diagnosis not present

## 2016-05-17 DIAGNOSIS — Z853 Personal history of malignant neoplasm of breast: Secondary | ICD-10-CM | POA: Diagnosis not present

## 2016-05-17 DIAGNOSIS — M1711 Unilateral primary osteoarthritis, right knee: Secondary | ICD-10-CM | POA: Diagnosis present

## 2016-05-17 DIAGNOSIS — E1122 Type 2 diabetes mellitus with diabetic chronic kidney disease: Secondary | ICD-10-CM | POA: Diagnosis present

## 2016-05-17 DIAGNOSIS — Z09 Encounter for follow-up examination after completed treatment for conditions other than malignant neoplasm: Secondary | ICD-10-CM

## 2016-05-17 DIAGNOSIS — Z96651 Presence of right artificial knee joint: Secondary | ICD-10-CM | POA: Diagnosis not present

## 2016-05-17 DIAGNOSIS — I129 Hypertensive chronic kidney disease with stage 1 through stage 4 chronic kidney disease, or unspecified chronic kidney disease: Secondary | ICD-10-CM | POA: Diagnosis present

## 2016-05-17 DIAGNOSIS — N189 Chronic kidney disease, unspecified: Secondary | ICD-10-CM | POA: Diagnosis not present

## 2016-05-17 DIAGNOSIS — M179 Osteoarthritis of knee, unspecified: Secondary | ICD-10-CM | POA: Diagnosis not present

## 2016-05-17 DIAGNOSIS — Z471 Aftercare following joint replacement surgery: Secondary | ICD-10-CM | POA: Diagnosis not present

## 2016-05-17 DIAGNOSIS — M25561 Pain in right knee: Secondary | ICD-10-CM | POA: Diagnosis not present

## 2016-05-17 HISTORY — PX: KNEE ARTHROPLASTY: SHX992

## 2016-05-17 LAB — POCT I-STAT, CHEM 8
BUN: 21 mg/dL — AB (ref 6–20)
CALCIUM ION: 1.43 mmol/L — AB (ref 1.12–1.23)
CREATININE: 1 mg/dL (ref 0.44–1.00)
Chloride: 105 mmol/L (ref 101–111)
Glucose, Bld: 152 mg/dL — ABNORMAL HIGH (ref 65–99)
HEMATOCRIT: 36 % (ref 36.0–46.0)
HEMOGLOBIN: 12.2 g/dL (ref 12.0–15.0)
Potassium: 3.8 mmol/L (ref 3.5–5.1)
SODIUM: 142 mmol/L (ref 135–145)
TCO2: 26 mmol/L (ref 0–100)

## 2016-05-17 LAB — GLUCOSE, CAPILLARY
GLUCOSE-CAPILLARY: 158 mg/dL — AB (ref 65–99)
GLUCOSE-CAPILLARY: 263 mg/dL — AB (ref 65–99)
Glucose-Capillary: 185 mg/dL — ABNORMAL HIGH (ref 65–99)

## 2016-05-17 SURGERY — ARTHROPLASTY, KNEE, TOTAL, USING IMAGELESS COMPUTER-ASSISTED NAVIGATION
Anesthesia: Regional | Site: Knee | Laterality: Right

## 2016-05-17 MED ORDER — FENTANYL CITRATE (PF) 100 MCG/2ML IJ SOLN
25.0000 ug | INTRAMUSCULAR | Status: DC | PRN
  Administered 2016-05-17 (×4): 25 ug via INTRAVENOUS

## 2016-05-17 MED ORDER — ONDANSETRON HCL 4 MG/2ML IJ SOLN
INTRAMUSCULAR | Status: AC
  Filled 2016-05-17: qty 2

## 2016-05-17 MED ORDER — SODIUM CHLORIDE 0.9 % IV SOLN: INTRAVENOUS | Status: DC

## 2016-05-17 MED ORDER — FENTANYL CITRATE (PF) 250 MCG/5ML IJ SOLN
INTRAMUSCULAR | Status: AC
  Filled 2016-05-17: qty 5

## 2016-05-17 MED ORDER — NEOSTIGMINE METHYLSULFATE 10 MG/10ML IV SOLN
INTRAVENOUS | Status: DC | PRN
  Administered 2016-05-17: 3 mg via INTRAVENOUS

## 2016-05-17 MED ORDER — HYDROCODONE-ACETAMINOPHEN 7.5-325 MG PO TABS
1.0000 | ORAL_TABLET | ORAL | Status: DC | PRN
  Administered 2016-05-17 – 2016-05-18 (×2): 2 via ORAL
  Filled 2016-05-17 (×2): qty 2

## 2016-05-17 MED ORDER — LOSARTAN POTASSIUM-HCTZ 100-25 MG PO TABS: 1.0000 | ORAL_TABLET | Freq: Every day | ORAL | Status: DC

## 2016-05-17 MED ORDER — PROPOFOL 10 MG/ML IV BOLUS
INTRAVENOUS | Status: DC | PRN
Start: 2016-05-17 — End: 2016-05-17
  Administered 2016-05-17: 120 mg via INTRAVENOUS
  Administered 2016-05-17: 30 mg via INTRAVENOUS

## 2016-05-17 MED ORDER — EPHEDRINE SULFATE 50 MG/ML IJ SOLN
INTRAMUSCULAR | Status: DC | PRN
  Administered 2016-05-17: 10 mg via INTRAVENOUS

## 2016-05-17 MED ORDER — LOSARTAN POTASSIUM 50 MG PO TABS
100.0000 mg | ORAL_TABLET | Freq: Every day | ORAL | Status: DC
  Administered 2016-05-18: 100 mg via ORAL
  Filled 2016-05-17: qty 2

## 2016-05-17 MED ORDER — BUPIVACAINE-EPINEPHRINE (PF) 0.5% -1:200000 IJ SOLN
INTRAMUSCULAR | Status: DC | PRN
Start: 2016-05-17 — End: 2016-05-17
  Administered 2016-05-17: 30 mL via PERINEURAL

## 2016-05-17 MED ORDER — POLYETHYLENE GLYCOL 3350 17 G PO PACK: 17.0000 g | PACK | Freq: Every day | ORAL | Status: DC | PRN

## 2016-05-17 MED ORDER — ACETAMINOPHEN 325 MG PO TABS: 650.0000 mg | ORAL_TABLET | Freq: Four times a day (QID) | ORAL | Status: DC | PRN

## 2016-05-17 MED ORDER — DEXTROSE 5 % IV SOLN
500.0000 mg | Freq: Four times a day (QID) | INTRAVENOUS | Status: DC | PRN
  Filled 2016-05-17: qty 5

## 2016-05-17 MED ORDER — ASPIRIN 81 MG PO CHEW
81.0000 mg | CHEWABLE_TABLET | Freq: Two times a day (BID) | ORAL | Status: DC
  Administered 2016-05-17 – 2016-05-18 (×2): 81 mg via ORAL
  Filled 2016-05-17 (×2): qty 1

## 2016-05-17 MED ORDER — HYDROCHLOROTHIAZIDE 25 MG PO TABS
25.0000 mg | ORAL_TABLET | Freq: Every day | ORAL | Status: DC
  Administered 2016-05-18: 25 mg via ORAL
  Filled 2016-05-17: qty 1

## 2016-05-17 MED ORDER — GLYCOPYRROLATE 0.2 MG/ML IJ SOLN
INTRAMUSCULAR | Status: DC | PRN
  Administered 2016-05-17: 0.4 mg via INTRAVENOUS

## 2016-05-17 MED ORDER — DEXAMETHASONE SODIUM PHOSPHATE 10 MG/ML IJ SOLN
10.0000 mg | Freq: Once | INTRAMUSCULAR | Status: AC
  Administered 2016-05-18: 10 mg via INTRAVENOUS
  Filled 2016-05-17: qty 1

## 2016-05-17 MED ORDER — SENNA 8.6 MG PO TABS
2.0000 | ORAL_TABLET | Freq: Every day | ORAL | Status: DC
  Administered 2016-05-17: 17.2 mg via ORAL
  Filled 2016-05-17: qty 2

## 2016-05-17 MED ORDER — PROMETHAZINE HCL 25 MG/ML IJ SOLN: 12.5000 mg | Freq: Once | INTRAMUSCULAR | Status: DC | PRN

## 2016-05-17 MED ORDER — AMLODIPINE BESYLATE 5 MG PO TABS
5.0000 mg | ORAL_TABLET | Freq: Two times a day (BID) | ORAL | Status: DC
Start: 2016-05-17 — End: 2016-05-18
  Administered 2016-05-18: 5 mg via ORAL
  Filled 2016-05-17 (×2): qty 1

## 2016-05-17 MED ORDER — ALUM & MAG HYDROXIDE-SIMETH 200-200-20 MG/5ML PO SUSP: 30.0000 mL | ORAL | Status: DC | PRN

## 2016-05-17 MED ORDER — MAGNESIUM CITRATE PO SOLN: 1.0000 | Freq: Once | ORAL | Status: DC | PRN

## 2016-05-17 MED ORDER — PHENYLEPHRINE 40 MCG/ML (10ML) SYRINGE FOR IV PUSH (FOR BLOOD PRESSURE SUPPORT)
PREFILLED_SYRINGE | INTRAVENOUS | Status: AC
Start: 2016-05-17 — End: 2016-05-17
  Filled 2016-05-17: qty 10

## 2016-05-17 MED ORDER — SODIUM CHLORIDE 0.9 % IJ SOLN
INTRAMUSCULAR | Status: DC | PRN
  Administered 2016-05-17: 30 mL

## 2016-05-17 MED ORDER — SODIUM CHLORIDE 0.9 % IR SOLN
Status: DC | PRN
  Administered 2016-05-17: 3000 mL

## 2016-05-17 MED ORDER — FENTANYL CITRATE (PF) 100 MCG/2ML IJ SOLN
INTRAMUSCULAR | Status: AC
  Administered 2016-05-17: 50 ug
  Filled 2016-05-17: qty 2

## 2016-05-17 MED ORDER — ONDANSETRON HCL 4 MG/2ML IJ SOLN: 4.0000 mg | Freq: Four times a day (QID) | INTRAMUSCULAR | Status: DC | PRN

## 2016-05-17 MED ORDER — METHOCARBAMOL 500 MG PO TABS
500.0000 mg | ORAL_TABLET | Freq: Four times a day (QID) | ORAL | Status: DC | PRN
  Administered 2016-05-17: 500 mg via ORAL
  Filled 2016-05-17: qty 1

## 2016-05-17 MED ORDER — GLYCOPYRROLATE 0.2 MG/ML IV SOSY
PREFILLED_SYRINGE | INTRAVENOUS | Status: AC
  Filled 2016-05-17: qty 3

## 2016-05-17 MED ORDER — ALLOPURINOL 100 MG PO TABS: 100.0000 mg | ORAL_TABLET | Freq: Every day | ORAL | Status: DC

## 2016-05-17 MED ORDER — EPHEDRINE 5 MG/ML INJ
INTRAVENOUS | Status: AC
  Filled 2016-05-17: qty 10

## 2016-05-17 MED ORDER — METOCLOPRAMIDE HCL 5 MG PO TABS: 5.0000 mg | ORAL_TABLET | Freq: Three times a day (TID) | ORAL | Status: DC | PRN

## 2016-05-17 MED ORDER — POTASSIUM CHLORIDE CRYS ER 20 MEQ PO TBCR
20.0000 meq | EXTENDED_RELEASE_TABLET | Freq: Two times a day (BID) | ORAL | Status: DC
  Administered 2016-05-17 – 2016-05-18 (×2): 20 meq via ORAL
  Filled 2016-05-17 (×2): qty 1

## 2016-05-17 MED ORDER — KETOROLAC TROMETHAMINE 30 MG/ML IJ SOLN
INTRAMUSCULAR | Status: DC | PRN
  Administered 2016-05-17: 30 mg via INTRAVENOUS

## 2016-05-17 MED ORDER — ACETAMINOPHEN 10 MG/ML IV SOLN
1000.0000 mg | INTRAVENOUS | Status: AC
  Administered 2016-05-17: 1000 mg via INTRAVENOUS

## 2016-05-17 MED ORDER — KETOROLAC TROMETHAMINE 30 MG/ML IJ SOLN
INTRAMUSCULAR | Status: AC
  Filled 2016-05-17: qty 1

## 2016-05-17 MED ORDER — LIDOCAINE HCL (CARDIAC) 20 MG/ML IV SOLN
INTRAVENOUS | Status: DC | PRN
  Administered 2016-05-17: 100 mg via INTRAVENOUS

## 2016-05-17 MED ORDER — LIDOCAINE 2% (20 MG/ML) 5 ML SYRINGE
INTRAMUSCULAR | Status: AC
  Filled 2016-05-17: qty 5

## 2016-05-17 MED ORDER — SORBITOL 70 % SOLN: 30.0000 mL | Freq: Every day | Status: DC | PRN

## 2016-05-17 MED ORDER — MENTHOL 3 MG MT LOZG: 1.0000 | LOZENGE | OROMUCOSAL | Status: DC | PRN

## 2016-05-17 MED ORDER — 0.9 % SODIUM CHLORIDE (POUR BTL) OPTIME
TOPICAL | Status: DC | PRN
  Administered 2016-05-17: 1000 mL

## 2016-05-17 MED ORDER — FENTANYL CITRATE (PF) 100 MCG/2ML IJ SOLN
INTRAMUSCULAR | Status: AC
  Filled 2016-05-17: qty 2

## 2016-05-17 MED ORDER — ONDANSETRON HCL 4 MG/2ML IJ SOLN
INTRAMUSCULAR | Status: DC | PRN
  Administered 2016-05-17: 4 mg via INTRAVENOUS

## 2016-05-17 MED ORDER — BUPIVACAINE-EPINEPHRINE (PF) 0.5% -1:200000 IJ SOLN
INTRAMUSCULAR | Status: AC
  Filled 2016-05-17: qty 30

## 2016-05-17 MED ORDER — MIDAZOLAM HCL 2 MG/2ML IJ SOLN
INTRAMUSCULAR | Status: AC
Start: 2016-05-17 — End: 2016-05-17
  Filled 2016-05-17: qty 2

## 2016-05-17 MED ORDER — PHENOL 1.4 % MT LIQD: 1.0000 | OROMUCOSAL | Status: DC | PRN

## 2016-05-17 MED ORDER — SODIUM CHLORIDE 0.9 % IV SOLN
INTRAVENOUS | Status: DC
  Administered 2016-05-17: 20:00:00 via INTRAVENOUS
  Administered 2016-05-18: 150 mL/h via INTRAVENOUS

## 2016-05-17 MED ORDER — TRANEXAMIC ACID 1000 MG/10ML IV SOLN
1000.0000 mg | Freq: Once | INTRAVENOUS | Status: DC
  Filled 2016-05-17: qty 10

## 2016-05-17 MED ORDER — MIDAZOLAM HCL 2 MG/2ML IJ SOLN
INTRAMUSCULAR | Status: AC
  Filled 2016-05-17: qty 2

## 2016-05-17 MED ORDER — CHLORHEXIDINE GLUCONATE 4 % EX LIQD: 60.0000 mL | Freq: Once | CUTANEOUS | Status: DC

## 2016-05-17 MED ORDER — PHENYLEPHRINE HCL 10 MG/ML IJ SOLN: INTRAMUSCULAR | Status: DC | PRN

## 2016-05-17 MED ORDER — DEXAMETHASONE SODIUM PHOSPHATE 10 MG/ML IJ SOLN
INTRAMUSCULAR | Status: DC | PRN
  Administered 2016-05-17: 5 mg via INTRAVENOUS

## 2016-05-17 MED ORDER — GLIMEPIRIDE 4 MG PO TABS
4.0000 mg | ORAL_TABLET | Freq: Every day | ORAL | Status: DC
  Administered 2016-05-18: 4 mg via ORAL
  Filled 2016-05-17: qty 1

## 2016-05-17 MED ORDER — POVIDONE-IODINE 10 % EX SWAB: 2.0000 "application " | Freq: Once | CUTANEOUS | Status: DC

## 2016-05-17 MED ORDER — ACETAMINOPHEN 10 MG/ML IV SOLN
INTRAVENOUS | Status: AC
  Filled 2016-05-17: qty 100

## 2016-05-17 MED ORDER — ONDANSETRON HCL 4 MG PO TABS: 4.0000 mg | ORAL_TABLET | Freq: Four times a day (QID) | ORAL | Status: DC | PRN

## 2016-05-17 MED ORDER — FENTANYL CITRATE (PF) 100 MCG/2ML IJ SOLN
INTRAMUSCULAR | Status: DC | PRN
  Administered 2016-05-17: 50 ug via INTRAVENOUS
  Administered 2016-05-17: 25 ug via INTRAVENOUS
  Administered 2016-05-17 (×2): 50 ug via INTRAVENOUS

## 2016-05-17 MED ORDER — INSULIN DETEMIR 100 UNIT/ML ~~LOC~~ SOLN
45.0000 [IU] | Freq: Every day | SUBCUTANEOUS | Status: DC
  Administered 2016-05-17: 45 [IU] via SUBCUTANEOUS
  Filled 2016-05-17 (×2): qty 0.45

## 2016-05-17 MED ORDER — BUPIVACAINE-EPINEPHRINE (PF) 0.5% -1:200000 IJ SOLN
INTRAMUSCULAR | Status: DC | PRN
  Administered 2016-05-17: 30 mL via PERINEURAL

## 2016-05-17 MED ORDER — LACTATED RINGERS IV SOLN
INTRAVENOUS | Status: DC
  Administered 2016-05-17 (×2): via INTRAVENOUS

## 2016-05-17 MED ORDER — EXENATIDE 5 MCG/0.02ML ~~LOC~~ SOPN: 5.0000 ug | PEN_INJECTOR | Freq: Two times a day (BID) | SUBCUTANEOUS | Status: DC

## 2016-05-17 MED ORDER — DIPHENHYDRAMINE HCL 12.5 MG/5ML PO ELIX: 12.5000 mg | ORAL_SOLUTION | ORAL | Status: DC | PRN

## 2016-05-17 MED ORDER — CEFAZOLIN SODIUM-DEXTROSE 2-4 GM/100ML-% IV SOLN
2.0000 g | Freq: Four times a day (QID) | INTRAVENOUS | Status: AC
  Administered 2016-05-17 (×2): 2 g via INTRAVENOUS
  Filled 2016-05-17 (×2): qty 100

## 2016-05-17 MED ORDER — CEFAZOLIN SODIUM-DEXTROSE 2-4 GM/100ML-% IV SOLN
2.0000 g | INTRAVENOUS | Status: AC
  Administered 2016-05-17: 2 g via INTRAVENOUS
  Filled 2016-05-17: qty 100

## 2016-05-17 MED ORDER — KETOROLAC TROMETHAMINE 15 MG/ML IJ SOLN
7.5000 mg | Freq: Four times a day (QID) | INTRAMUSCULAR | Status: DC
  Administered 2016-05-17 – 2016-05-18 (×3): 7.5 mg via INTRAVENOUS
  Filled 2016-05-17 (×3): qty 1

## 2016-05-17 MED ORDER — ACETAMINOPHEN 650 MG RE SUPP: 650.0000 mg | Freq: Four times a day (QID) | RECTAL | Status: DC | PRN

## 2016-05-17 MED ORDER — DEXAMETHASONE SODIUM PHOSPHATE 10 MG/ML IJ SOLN
INTRAMUSCULAR | Status: AC
  Filled 2016-05-17: qty 1

## 2016-05-17 MED ORDER — METOCLOPRAMIDE HCL 5 MG/ML IJ SOLN: 5.0000 mg | Freq: Three times a day (TID) | INTRAMUSCULAR | Status: DC | PRN

## 2016-05-17 MED ORDER — DOCUSATE SODIUM 100 MG PO CAPS
100.0000 mg | ORAL_CAPSULE | Freq: Two times a day (BID) | ORAL | Status: DC
  Administered 2016-05-17 – 2016-05-18 (×2): 100 mg via ORAL
  Filled 2016-05-17 (×2): qty 1

## 2016-05-17 MED ORDER — ROCURONIUM BROMIDE 100 MG/10ML IV SOLN
INTRAVENOUS | Status: DC | PRN
  Administered 2016-05-17: 30 mg via INTRAVENOUS

## 2016-05-17 MED ORDER — METOPROLOL SUCCINATE ER 100 MG PO TB24
100.0000 mg | ORAL_TABLET | Freq: Every day | ORAL | Status: DC
  Administered 2016-05-18: 100 mg via ORAL
  Filled 2016-05-17: qty 1

## 2016-05-17 MED ORDER — NEOSTIGMINE METHYLSULFATE 5 MG/5ML IV SOSY
PREFILLED_SYRINGE | INTRAVENOUS | Status: AC
  Filled 2016-05-17: qty 5

## 2016-05-17 MED ORDER — HYDROMORPHONE HCL 1 MG/ML IJ SOLN
0.5000 mg | INTRAMUSCULAR | Status: DC | PRN
  Filled 2016-05-17: qty 2

## 2016-05-17 MED ORDER — LORATADINE 10 MG PO TABS
10.0000 mg | ORAL_TABLET | Freq: Every day | ORAL | Status: DC
  Administered 2016-05-18: 10 mg via ORAL
  Filled 2016-05-17: qty 1

## 2016-05-17 SURGICAL SUPPLY — 54 items
ADH SKN CLS APL DERMABOND .7 (GAUZE/BANDAGES/DRESSINGS) ×2
ADH SKN CLS LQ APL DERMABOND (GAUZE/BANDAGES/DRESSINGS) ×2
ALCOHOL ISOPROPYL (RUBBING) (MISCELLANEOUS) ×3 IMPLANT
BANDAGE ELASTIC 6 VELCRO ST LF (GAUZE/BANDAGES/DRESSINGS) ×3 IMPLANT
BLADE RECIPROCATING TAPERED (BLADE) ×2 IMPLANT
BLADE SAW RECIP 87.9 MT (BLADE) ×3 IMPLANT
CAPT KNEE TRIATH TK-4 ×2 IMPLANT
CHLORAPREP W/TINT 26ML (MISCELLANEOUS) ×6 IMPLANT
COVER SURGICAL LIGHT HANDLE (MISCELLANEOUS) ×2 IMPLANT
CUFF TOURNIQUET SINGLE 34IN LL (TOURNIQUET CUFF) ×3 IMPLANT
DERMABOND ADHESIVE PROPEN (GAUZE/BANDAGES/DRESSINGS) ×4
DERMABOND ADVANCED (GAUZE/BANDAGES/DRESSINGS) ×4
DERMABOND ADVANCED .7 DNX12 (GAUZE/BANDAGES/DRESSINGS) ×1 IMPLANT
DERMABOND ADVANCED .7 DNX6 (GAUZE/BANDAGES/DRESSINGS) IMPLANT
DRAIN HEMOVAC 7FR (DRAIN) ×3 IMPLANT
DRAPE SHEET LG 3/4 BI-LAMINATE (DRAPES) ×6 IMPLANT
DRAPE U-SHAPE 47X51 STRL (DRAPES) ×3 IMPLANT
DRAPE UNIVERSAL PACK (DRAPES) ×3 IMPLANT
DRSG AQUACEL AG ADV 3.5X14 (GAUZE/BANDAGES/DRESSINGS) ×3 IMPLANT
DRSG TEGADERM 2-3/8X2-3/4 SM (GAUZE/BANDAGES/DRESSINGS) ×3 IMPLANT
ELECT CAUTERY BLADE 6.4 (BLADE) ×2 IMPLANT
ELECT REM PT RETURN 9FT ADLT (ELECTROSURGICAL) ×3
ELECTRODE REM PT RTRN 9FT ADLT (ELECTROSURGICAL) ×1 IMPLANT
EVACUATOR 1/8 PVC DRAIN (DRAIN) IMPLANT
GLOVE BIO SURGEON STRL SZ8.5 (GLOVE) ×6 IMPLANT
GLOVE BIOGEL PI IND STRL 8.5 (GLOVE) ×1 IMPLANT
GLOVE BIOGEL PI INDICATOR 8.5 (GLOVE) ×2
GOWN STRL REUS W/TWL 2XL LVL3 (GOWN DISPOSABLE) ×3 IMPLANT
HANDPIECE INTERPULSE COAX TIP (DISPOSABLE) ×3
KIT BASIN OR (CUSTOM PROCEDURE TRAY) ×3 IMPLANT
MANIFOLD NEPTUNE II (INSTRUMENTS) ×3 IMPLANT
MARKER SKIN DUAL TIP RULER LAB (MISCELLANEOUS) ×2 IMPLANT
NAVIGATION CASE RENTAL (PORTABLE EQUIPMENT SUPPLIES) ×2 IMPLANT
NDL SPNL 18GX3.5 QUINCKE PK (NEEDLE) ×2 IMPLANT
NEEDLE SPNL 18GX3.5 QUINCKE PK (NEEDLE) ×6 IMPLANT
PACK TOTAL JOINT (CUSTOM PROCEDURE TRAY) ×3 IMPLANT
PACK TOTAL KNEE CUSTOM (KITS) ×3 IMPLANT
PADDING CAST COTTON 6X4 STRL (CAST SUPPLIES) ×3 IMPLANT
SAW OSC TIP CART 19.5X105X1.3 (SAW) ×3 IMPLANT
SEALER BIPOLAR AQUA 6.0 (INSTRUMENTS) ×5 IMPLANT
SET HNDPC FAN SPRY TIP SCT (DISPOSABLE) ×1 IMPLANT
SET PAD KNEE POSITIONER (MISCELLANEOUS) ×3 IMPLANT
SUT MNCRL AB 3-0 PS2 27 (SUTURE) ×3 IMPLANT
SUT MNCRL AB 4-0 PS2 18 (SUTURE) ×2 IMPLANT
SUT MON AB 2-0 CT1 36 (SUTURE) ×8 IMPLANT
SUT VIC AB 1 CTX 27 (SUTURE) ×5 IMPLANT
SUT VIC AB 1 CTX 36 (SUTURE) ×6
SUT VIC AB 1 CTX36XBRD ANBCTR (SUTURE) IMPLANT
SUT VIC AB 2-0 CT1 27 (SUTURE) ×3
SUT VIC AB 2-0 CT1 TAPERPNT 27 (SUTURE) ×1 IMPLANT
SUT VLOC 180 0 24IN GS25 (SUTURE) ×3 IMPLANT
SYR 50ML LL SCALE MARK (SYRINGE) ×6 IMPLANT
TOWER CARTRIDGE SMART MIX (DISPOSABLE) ×3 IMPLANT
WRAP KNEE MAXI GEL POST OP (GAUZE/BANDAGES/DRESSINGS) ×3 IMPLANT

## 2016-05-17 NOTE — H&P (View-Only) (Signed)
TOTAL KNEE ADMISSION H&P  Patient is being admitted for right total knee arthroplasty.  Subjective:  Chief Complaint:right knee pain.  HPI: Carol Keith, 65 y.o. female, has a history of pain and functional disability in the right knee due to arthritis and has failed non-surgical conservative treatments for greater than 12 weeks to includeNSAID's and/or analgesics, corticosteriod injections, flexibility and strengthening excercises, use of assistive devices, weight reduction as appropriate and activity modification.  Onset of symptoms was gradual, starting >10 years ago with gradually worsening course since that time. The patient noted no past surgery on the right knee(s).  Patient currently rates pain in the bilaterally knee(s) at 10 out of 10 with activity. Patient has night pain, worsening of pain with activity and weight bearing, pain that interferes with activities of daily living, pain with passive range of motion, crepitus and joint swelling.  Patient has evidence of subchondral cysts, subchondral sclerosis, periarticular osteophytes and joint space narrowing by imaging studies. There is no active infection.  There are no active problems to display for this patient.  No past medical history on file.  No past surgical history on file.   (Not in a hospital admission) Allergies not on file  Social History  Substance Use Topics  . Smoking status: Not on file  . Smokeless tobacco: Not on file  . Alcohol Use: Not on file    No family history on file.   Review of Systems  Constitutional: Positive for diaphoresis.  Eyes: Positive for blurred vision.  Respiratory: Negative.   Cardiovascular: Negative.   Gastrointestinal: Negative.   Genitourinary: Negative.   Musculoskeletal: Positive for back pain and joint pain.  Skin: Negative.   Neurological: Positive for dizziness.  Endo/Heme/Allergies: Negative.   Psychiatric/Behavioral: Negative.     Objective:  Physical Exam   Vitals reviewed. Constitutional: She is oriented to person, place, and time. She appears well-developed and well-nourished.  HENT:  Head: Normocephalic and atraumatic.  Eyes: Conjunctivae and EOM are normal. Pupils are equal, round, and reactive to light.  Neck: Normal range of motion. Neck supple.  Cardiovascular: Normal rate and regular rhythm.   Respiratory: Effort normal. No respiratory distress.  GI: Soft. Bowel sounds are normal. She exhibits no distension.  Genitourinary:  deferred  Musculoskeletal:       Right knee: She exhibits swelling, effusion and deformity. Tenderness found. Medial joint line tenderness noted.  ROM 15-105  Neurological: She is alert and oriented to person, place, and time. She has normal reflexes.  Skin: Skin is warm.  Psychiatric: She has a normal mood and affect. Her behavior is normal. Judgment and thought content normal.    Vital signs in last 24 hours: @VSRANGES @  Labs:   There is no height or weight on file to calculate BMI.   Imaging Review Plain radiographs demonstrate severe degenerative joint disease of the bilaterally knee(s). The overall alignment issignificant varus. The bone quality appears to be adequate for age and reported activity level.  Assessment/Plan:  End stage arthritis, right knee   The patient history, physical examination, clinical judgment of the provider and imaging studies are consistent with end stage degenerative joint disease of the right knee(s) and total knee arthroplasty is deemed medically necessary. The treatment options including medical management, injection therapy arthroscopy and arthroplasty were discussed at length. The risks and benefits of total knee arthroplasty were presented and reviewed. The risks due to aseptic loosening, infection, stiffness, patella tracking problems, thromboembolic complications and other imponderables were discussed. The patient  acknowledged the explanation, agreed to proceed  with the plan and consent was signed. Patient is being admitted for inpatient treatment for surgery, pain control, PT, OT, prophylactic antibiotics, VTE prophylaxis, progressive ambulation and ADL's and discharge planning. The patient is planning to be discharged home with home health services

## 2016-05-17 NOTE — Discharge Instructions (Signed)
° °Dr. Dorion Petillo °Total Joint Specialist °[facility] °[address] °[place], [place] [id number] °[phone] ° °TOTAL KNEE REPLACEMENT POSTOPERATIVE DIRECTIONS ° ° ° °Knee Rehabilitation, Guidelines Following Surgery  °Results after knee surgery are often greatly improved when you follow the exercise, range of motion and muscle strengthening exercises prescribed by your doctor. Safety measures are also important to protect the knee from further injury. Any time any of these exercises cause you to have increased pain or swelling in your knee joint, decrease the amount until you are comfortable again and slowly increase them. If you have problems or questions, call your caregiver or physical therapist for advice.  ° °WEIGHT BEARING °Weight bearing as tolerated with assist device (walker, cane, etc) as directed, use it as long as suggested by your surgeon or therapist, typically at least 4-6 weeks. ° °HOME CARE INSTRUCTIONS  °Remove items at home which could result in a fall. This includes throw rugs or furniture in walking pathways.  °Continue medications as instructed at time of discharge. °You may have some home medications which will be placed on hold until you complete the course of blood thinner medication.  °You may start showering once you are discharged home but do not submerge the incision under water. Just pat the incision dry and apply a dry gauze dressing on daily. °Walk with walker as instructed.  °You may resume a sexual relationship in one month or when given the OK by your doctor.  °· Use walker as long as suggested by your caregivers. °· Avoid periods of inactivity such as sitting longer than an hour when not asleep. This helps prevent blood clots.  °You may put full weight on your legs and walk as much as is comfortable.  °You may return to work once you are cleared by your doctor.  °Do not drive a car for 6 weeks or until released by you surgeon.  °· Do not drive  while taking narcotics.  °Wear the elastic stockings for three weeks following surgery during the day but you may remove then at night. °Make sure you keep all of your appointments after your operation with all of your doctors and caregivers. You should call the office at the above phone number and make an appointment for approximately two weeks after the date of your surgery. °Do not remove your surgical dressing. The dressing is waterproof; you may take showers in 3 days, but do not take tub baths or submerge the dressing. °Please pick up a stool softener and laxative for home use as long as you are requiring pain medications. °· ICE to the affected knee every three hours for 30 minutes at a time and then as needed for pain and swelling.  Continue to use ice on the knee for pain and swelling from surgery. You may notice swelling that will progress down to the foot and ankle.  This is normal after surgery.  Elevate the leg when you are not up walking on it.   °It is important for you to complete the blood thinner medication as prescribed by your doctor. °· Continue to use the breathing machine which will help keep your temperature down.  It is common for your temperature to cycle up and down following surgery, especially at night when you are not up moving around and exerting yourself.  The breathing machine keeps your lungs expanded and your temperature down. ° °RANGE OF MOTION AND STRENGTHENING EXERCISES  °Rehabilitation of the knee is important following   a knee injury or an operation. After just a few days of immobilization, the muscles of the thigh which control the knee become weakened and shrink (atrophy). Knee exercises are designed to build up the tone and strength of the thigh muscles and to improve knee motion. Often times heat used for twenty to thirty minutes before working out will loosen up your tissues and help with improving the range of motion but do not use heat for the first two weeks following  surgery. These exercises can be done on a training (exercise) mat, on the floor, on a table or on a bed. Use what ever works the best and is most comfortable for you Knee exercises include:  °Leg Lifts - While your knee is still immobilized in a splint or cast, you can do straight leg raises. Lift the leg to 60 degrees, hold for 3 sec, and slowly lower the leg. Repeat 10-20 times 2-3 times daily. Perform this exercise against resistance later as your knee gets better.  °Quad and Hamstring Sets - Tighten up the muscle on the front of the thigh (Quad) and hold for 5-10 sec. Repeat this 10-20 times hourly. Hamstring sets are done by pushing the foot backward against an object and holding for 5-10 sec. Repeat as with quad sets.  °A rehabilitation program following serious knee injuries can speed recovery and prevent re-injury in the future due to weakened muscles. Contact your doctor or a physical therapist for more information on knee rehabilitation.  ° °SKILLED REHAB INSTRUCTIONS: °If the patient is transferred to a skilled rehab facility following release from the hospital, a list of the current medications will be sent to the facility for the patient to continue.  When discharged from the skilled rehab facility, please have the facility set up the patient's Home Health Physical Therapy prior to being released. Also, the skilled facility will be responsible for providing the patient with their medications at time of release from the facility to include their pain medication, the muscle relaxants, and their blood thinner medication. If the patient is still at the rehab facility at time of the two week follow up appointment, the skilled rehab facility will also need to assist the patient in arranging follow up appointment in our office and any transportation needs. ° °MAKE SURE YOU:  °Understand these instructions.  °Will watch your condition.  °Will get help right away if you are not doing well or get worse.   ° ° °Pick up stool softner and laxative for home use following surgery while on pain medications. °Do NOT remove your dressing. You may shower.  °Do not take tub baths or submerge incision under water. °May shower starting three days after surgery. °Please use a clean towel to pat the incision dry following showers. °Continue to use ice for pain and swelling after surgery. °Do not use any lotions or creams on the incision until instructed by your surgeon. ° °

## 2016-05-17 NOTE — Anesthesia Procedure Notes (Signed)
Anesthesia Regional Block:  Adductor canal block  Pre-Anesthetic Checklist: ,, timeout performed, Correct Patient, Correct Site, Correct Laterality, Correct Procedure, Correct Position, risks and benefits discussed, surgical consent, pre-op evaluation,  At surgeon's request and post-op pain management  Laterality: Right  Prep: chloraprep       Needles:  Injection technique: Single-shot  Needle Type: Stimiplex     Needle Length: 9cm 9 cm Needle Gauge: 21 and 21 G    Additional Needles:  Procedures: ultrasound guided (picture in chart) Adductor canal block Narrative:  Start time: 05/17/2016 11:50 AM End time: 05/17/2016 11:53 AM Injection made incrementally with aspirations every 5 mL.  Performed by: Personally  Anesthesiologist: Nilda Simmer

## 2016-05-17 NOTE — Op Note (Signed)
OPERATIVE REPORT  SURGEON: Rod Can, MD   ASSISTANT: Ky Barban, RNFA.  PREOPERATIVE DIAGNOSIS: Right knee arthritis.   POSTOPERATIVE DIAGNOSIS: Right knee arthritis.   PROCEDURE: Right total knee arthroplasty.   IMPLANTS: Stryker Triathlon CR femur, size 4. Stryker Tritanium tibia, size 5. X3 polyethelyene insert, size 11 mm, CR. 3 button asymmetric patella, size 35 mm.  ANESTHESIA:  GA combined with regional for post-op pain  TOURNIQUET TIME: Not utilized.   ESTIMATED BLOOD LOSS: 350 mL.  ANTIBIOTICS: 2 g Ancef.  DRAINS: None.  COMPLICATIONS: None   CONDITION: PACU - hemodynamically stable.   BRIEF CLINICAL NOTE: Carol Keith is a 65 y.o. female with a long-standing history of Right knee arthritis. After failing conservative management, the patient was indicated for total knee arthroplasty. The risks, benefits, and alternatives to the procedure were explained, and the patient elected to proceed.  PROCEDURE IN DETAIL: The patient was identified in the holding area. The surgical site was marked. Adductor canal block was placed by anesthesia. Once inside the operative room, general anesthesia was induced, and a foley catheter was inserted. The patient was then positioned, a nonsterile tourniquet was placed, and the lower extremity was prepped and draped in the normal sterile surgical fashion. A time-out was called verifying side and site of surgery. The patient received IV antibiotics within 60 minutes of beginning the procedure. The tourniquet was not utilized.  An anterior approach to the knee was performed utilizing a midvastus arthrotomy. A medial release was performed and the patellar fat pad was excised. Stryker navigation was used to cut the distal femur perpendicular to the mechanical axis. A freehand patellar resection was performed, and the patella was sized  an prepared with 3 lug holes.  Nagivation was used to make a neutral proximal tibia resection, taking 9 mm of bone from the less affected lateral side with 3 degrees of slope. The menisci were excised. A spacer block was placed, and the alignment and balance in extension were confirmed.   The distal femur was sized using the 3-degree external rotation guide referencing the posterior femoral cortex. The appropriate 4-in-1 cutting block was pinned into place. Rotation was checked using Whiteside's line, the epicondylar axis, and then confirmed with a spacer block in flexion. The remaining femoral cuts were performed, taking care to protect the MCL.  The tibia was sized and the trial tray was pinned into place. The remaining trail components were inserted. The knee was stable to varus and valgus stress through a full range of motion. The patella tracked centrally, and the PCL was well balanced. The trial components were removed, and the proximal tibial surface was prepared. Final components were impacted into place. The knee was tested for a final time and found to be well balanced.  The wound was copiously irrigated with a dilute betadine solution followed by normal saline with pulse lavage. Marcaine solution was injected into the periarticular soft tissue. The wound was closed in layers using #1 Vicryl and V-Loc for the fascia, 2-0 Vicryl for the subcutaneous fat, 2-0 Monocryl for the deep dermal layer, 3-0 running Monocryl subcuticular Stitch, and Dermabond for the skin. Once the glue was fully dried, an Aquacell Ag and compressive dressing were applied. Tthe patient was transported to the recovery room in stable condition. Sponge, needle, and instrument counts were correct at the end of the case x2. The patient tolerated the procedure well and there were no known complications.

## 2016-05-17 NOTE — Anesthesia Postprocedure Evaluation (Signed)
Anesthesia Post Note  Patient: Carol Keith  Procedure(s) Performed: Procedure(s) (LRB): RIGHT TOTAL KNEE ARTHROPLASTY WITH COMPUTER NAVIGATION (Right)  Patient location during evaluation: PACU Anesthesia Type: General Level of consciousness: awake and alert Pain management: pain level controlled Vital Signs Assessment: post-procedure vital signs reviewed and stable Respiratory status: spontaneous breathing, nonlabored ventilation, respiratory function stable and patient connected to nasal cannula oxygen Cardiovascular status: blood pressure returned to baseline and stable Postop Assessment: no signs of nausea or vomiting Anesthetic complications: no    Last Vitals:  Vitals:   05/17/16 1205 05/17/16 1503  BP: (!) 146/59   Pulse: 78   Resp: 11   Temp:  36.5 C    Last Pain:  Vitals:   05/17/16 1600  TempSrc:   PainSc: 10-Worst pain ever                 Tanya Crothers DAVID

## 2016-05-17 NOTE — Interval H&P Note (Signed)
History and Physical Interval Note:  05/17/2016 12:10 PM  Carol Keith  has presented today for surgery, with the diagnosis of DJD Right Knee  The various methods of treatment have been discussed with the patient and family. After consideration of risks, benefits and other options for treatment, the patient has consented to  Procedure(s) with comments: RIGHT TOTAL KNEE ARTHROPLASTY WITH COMPUTER NAVIGATION (Right) - Needs RNFA as a surgical intervention .  The patient's history has been reviewed, patient examined, no change in status, stable for surgery.  I have reviewed the patient's chart and labs.  Questions were answered to the patient's satisfaction.     Adryan Druckenmiller, Horald Pollen

## 2016-05-17 NOTE — Transfer of Care (Signed)
Immediate Anesthesia Transfer of Care Note  Patient: Carol Keith  Procedure(s) Performed: Procedure(s) with comments: RIGHT TOTAL KNEE ARTHROPLASTY WITH COMPUTER NAVIGATION (Right) - Needs RNFA  Patient Location: PACU  Anesthesia Type:General and Regional  Level of Consciousness: awake, alert  and oriented  Airway & Oxygen Therapy: Patient Spontanous Breathing and Patient connected to nasal cannula oxygen  Post-op Assessment: Report given to RN and Post -op Vital signs reviewed and stable  Post vital signs: Reviewed and stable  Last Vitals:  Vitals:   05/17/16 1155 05/17/16 1205  BP: (!) 161/78 (!) 146/59  Pulse: 75 78  Resp: 16 11  Temp:      Last Pain:  Vitals:   05/17/16 1205  TempSrc:   PainSc: 0-No pain         Complications: No apparent anesthesia complications

## 2016-05-17 NOTE — Discharge Summary (Signed)
Physician Discharge Summary  Patient ID: Carol Keith MRN: [id number] DOB/AGE: 1951/03/03 65 y.o.  Admit date: 05/17/2016 Discharge date: 05/18/2016  Admission Diagnoses:  Primary osteoarthritis of right knee  Discharge Diagnoses:  Principal Problem:   Primary osteoarthritis of right knee   Past Medical History:  Diagnosis Date  . Arthritis    kneees, back   . Cancer [facility])    breast cancer  . Chronic kidney disease    appt. schedule for Dr. Lynnette Caffey, [id number]  . Complication of anesthesia   . Diabetes mellitus without complication ([place])    diagnosed x10 yrs. ago  . Heart murmur   . Hypertension   . PONV (postoperative nausea and vomiting)     Surgeries: Procedure(s): RIGHT TOTAL KNEE ARTHROPLASTY WITH COMPUTER NAVIGATION on 05/17/2016   Consultants (if any):   Discharged Condition: Improved  Hospital Course: Carol Keith is an 65 y.o. female who was admitted 05/17/2016 with a diagnosis of Primary osteoarthritis of right knee and went to the operating room on 05/17/2016 and underwent the above named procedures.    She was given perioperative antibiotics:  Anti-infectives    Start     Dose/Rate Route Frequency Ordered Stop   05/17/16 1800  ceFAZolin (ANCEF) IVPB 2g/100 mL premix     2 g 200 mL/hr over 30 Minutes Intravenous Every 6 hours 05/17/16 1752 05/18/16 0013   05/17/16 1000  ceFAZolin (ANCEF) IVPB 2g/100 mL premix     2 g 200 mL/hr over 30 Minutes Intravenous To Surgery 05/17/16 0950 05/17/16 1240    .  She was given sequential compression devices, early ambulation, and ASA for DVT prophylaxis.  She benefited maximally from the hospital stay and there were no complications.    Recent vital signs:  Vitals:   05/18/16 0027 05/18/16 0445  BP: 136/63 129/67  Pulse: 82 71  Resp: 17 17  Temp: 98.4 F (36.9 C) 97.8 F (36.6 C)    Recent laboratory studies:  Lab Results  Component Value Date   HGB 9.8 (L) 05/18/2016   HGB 12.2 05/17/2016   HGB 11.4  (L) 05/06/2016   Lab Results  Component Value Date   WBC 9.5 05/18/2016   PLT 222 05/18/2016   No results found for: INR Lab Results  Component Value Date   NA 136 05/18/2016   K 3.8 05/18/2016   CL 106 05/18/2016   CO2 23 05/18/2016   BUN 19 05/18/2016   CREATININE 1.09 (H) 05/18/2016   GLUCOSE 182 (H) 05/18/2016    Discharge Medications:     Medication List    STOP taking these medications   aspirin EC 81 MG tablet Replaced by:  aspirin 81 MG chewable tablet     TAKE these medications   allopurinol 100 MG tablet Commonly known as:  ZYLOPRIM Take 100 mg by mouth daily with supper.   amLODipine 10 MG tablet Commonly known as:  NORVASC Take 5 mg by mouth 2 (two) times daily.   aspirin 81 MG chewable tablet Chew 1 tablet (81 mg total) by mouth 2 (two) times daily. Replaces:  aspirin EC 81 MG tablet   cetirizine 10 MG tablet Commonly known as:  ZYRTEC Take 10 mg by mouth daily before breakfast.   diclofenac 75 MG EC tablet Commonly known as:  VOLTAREN Take 75 mg by mouth 2 (two) times daily.   docusate sodium 100 MG capsule Commonly known as:  COLACE Take 1 capsule (100 mg total) by mouth 2 (two) times daily.  exenatide 5 MCG/0.02ML Sopn injection Commonly known as:  BYETTA Inject 5 mcg into the skin 2 (two) times daily with a meal.   glimepiride 4 MG tablet Commonly known as:  AMARYL Take 4 mg by mouth daily with breakfast.   HYDROcodone-acetaminophen 7.5-325 MG tablet Commonly known as:  NORCO Take 1-2 tablets by mouth every 4 (four) hours as needed (breakthrough pain).   insulin detemir 100 UNIT/ML injection Commonly known as:  LEVEMIR Inject 45 Units into the skin at bedtime.   losartan-hydrochlorothiazide 100-25 MG tablet Commonly known as:  HYZAAR Take 1 tablet by mouth daily with breakfast.   methocarbamol 500 MG tablet Commonly known as:  ROBAXIN Take 1 tablet (500 mg total) by mouth every 6 (six) hours as needed for muscle spasms.    metoprolol succinate 100 MG 24 hr tablet Commonly known as:  TOPROL-XL Take 100 mg by mouth daily with breakfast. Take with or immediately following a meal.   ondansetron 4 MG tablet Commonly known as:  ZOFRAN Take 1 tablet (4 mg total) by mouth every 6 (six) hours as needed for nausea.   potassium chloride 10 MEQ tablet Commonly known as:  K-DUR Take 10 mEq by mouth daily with supper.   senna 8.6 MG Tabs tablet Commonly known as:  SENOKOT Take 2 tablets (17.2 mg total) by mouth at bedtime.   Vitamin D (Ergocalciferol) 50000 units Caps capsule Commonly known as:  DRISDOL Take 50,000 Units by mouth every 7 (seven) days. Thursday       Diagnostic Studies: Dg Knee Right Port  Result Date: 05/17/2016 CLINICAL DATA:  65 year old female status post right knee arthroplasty. Initial encounter. EXAM: PORTABLE RIGHT KNEE - 1-2 VIEW COMPARISON:  None. FINDINGS: Portable AP and cross-table lateral views at 1522 hours. Sequelae of right total knee arthroplasty. Hardware appears intact and normally aligned. Postoperative changes to the surrounding soft tissues, including moderate volume of gas within the joint. No unexpected osseous changes identified. IMPRESSION: Right total knee arthroplasty with no adverse features. Electronically Signed   By: Genevie Ann M.D.   On: 05/17/2016 15:30    Disposition: Final discharge disposition not confirmed  Discharge Instructions    Call MD / Call 911    Complete by:  As directed   If you experience chest pain or shortness of breath, CALL 911 and be transported to the hospital emergency room.  If you develope a fever above 101 F, pus (white drainage) or increased drainage or redness at the wound, or calf pain, call your surgeon's office.   Constipation Prevention    Complete by:  As directed   Drink plenty of fluids.  Prune juice may be helpful.  You may use a stool softener, such as Colace (over the counter) 100 mg twice a day.  Use MiraLax (over the counter)  for constipation as needed.   Diet - low sodium heart healthy    Complete by:  As directed   Do not put a pillow under the knee. Place it under the heel.    Complete by:  As directed   Driving restrictions    Complete by:  As directed   No driving for 6 weeks   Increase activity slowly as tolerated    Complete by:  As directed   Lifting restrictions    Complete by:  As directed   No lifting for 6 weeks   TED hose    Complete by:  As directed   Use stockings (TED hose) for 2  weeks on both leg(s).  You may remove them at night for sleeping.      Follow-up Information    Diontay Rosencrans, Horald Pollen, MD. Schedule an appointment as soon as possible for a visit in 2 weeks.   Specialty:  Orthopedic Surgery Why:  For wound re-check Contact information: [address] [address] [phone]            Signed: Elie Goody 05/18/2016, 7:53 AM

## 2016-05-17 NOTE — Anesthesia Procedure Notes (Signed)
Procedure Name: Intubation Date/Time: 05/17/2016 12:31 PM Performed by: Garrison Columbus T Pre-anesthesia Checklist: Patient identified, Emergency Drugs available, Suction available and Patient being monitored Patient Re-evaluated:Patient Re-evaluated prior to inductionOxygen Delivery Method: Circle System Utilized Preoxygenation: Pre-oxygenation with 100% oxygen Intubation Type: IV induction Ventilation: Mask ventilation without difficulty Laryngoscope Size: Mac and 4 Grade View: Grade I Tube type: Oral Tube size: 7.5 mm Number of attempts: 1 Airway Equipment and Method: Stylet and Oral airway Placement Confirmation: ETT inserted through vocal cords under direct vision,  positive ETCO2 and breath sounds checked- equal and bilateral Secured at: 22 cm Tube secured with: Tape Dental Injury: Teeth and Oropharynx as per pre-operative assessment  Comments: Intubation by Williemae Area, CRNA

## 2016-05-18 ENCOUNTER — Encounter (HOSPITAL_COMMUNITY): Payer: Self-pay | Admitting: Orthopedic Surgery

## 2016-05-18 LAB — BASIC METABOLIC PANEL
Anion gap: 7 (ref 5–15)
BUN: 19 mg/dL (ref 6–20)
CALCIUM: 9.4 mg/dL (ref 8.9–10.3)
CO2: 23 mmol/L (ref 22–32)
CREATININE: 1.09 mg/dL — AB (ref 0.44–1.00)
Chloride: 106 mmol/L (ref 101–111)
GFR calc non Af Amer: 51 mL/min — ABNORMAL LOW (ref >60)
GFR, EST AFRICAN AMERICAN: 59 mL/min — AB (ref >60)
Glucose, Bld: 182 mg/dL — ABNORMAL HIGH (ref 65–99)
Potassium: 3.8 mmol/L (ref 3.5–5.1)
SODIUM: 136 mmol/L (ref 135–145)

## 2016-05-18 LAB — CBC
HCT: 29.8 % — ABNORMAL LOW (ref 36.0–46.0)
Hemoglobin: 9.8 g/dL — ABNORMAL LOW (ref 12.0–15.0)
MCH: 28.3 pg (ref 26.0–34.0)
MCHC: 32.9 g/dL (ref 30.0–36.0)
MCV: 86.1 fL (ref 78.0–100.0)
Platelets: 222 10*3/uL (ref 150–400)
RBC: 3.46 MIL/uL — ABNORMAL LOW (ref 3.87–5.11)
RDW: 13 % (ref 11.5–15.5)
WBC: 9.5 10*3/uL (ref 4.0–10.5)

## 2016-05-18 LAB — GLUCOSE, CAPILLARY: Glucose-Capillary: 148 mg/dL — ABNORMAL HIGH (ref 65–99)

## 2016-05-18 MED ORDER — ONDANSETRON HCL 4 MG PO TABS: 4.0000 mg | ORAL_TABLET | Freq: Four times a day (QID) | ORAL | 0 refills | Status: DC | PRN

## 2016-05-18 MED ORDER — HYDROCODONE-ACETAMINOPHEN 7.5-325 MG PO TABS: 1.0000 | ORAL_TABLET | ORAL | 0 refills | Status: DC | PRN

## 2016-05-18 MED ORDER — SENNA 8.6 MG PO TABS: 2.0000 | ORAL_TABLET | Freq: Every day | ORAL | 3 refills | Status: DC

## 2016-05-18 MED ORDER — ASPIRIN 81 MG PO CHEW: 81.0000 mg | CHEWABLE_TABLET | Freq: Two times a day (BID) | ORAL | 1 refills | Status: DC

## 2016-05-18 MED ORDER — DOCUSATE SODIUM 100 MG PO CAPS: 100.0000 mg | ORAL_CAPSULE | Freq: Two times a day (BID) | ORAL | 3 refills | Status: DC

## 2016-05-18 MED ORDER — METHOCARBAMOL 500 MG PO TABS: 500.0000 mg | ORAL_TABLET | Freq: Four times a day (QID) | ORAL | 1 refills | Status: DC | PRN

## 2016-05-18 NOTE — Evaluation (Signed)
Physical Therapy Evaluation Patient Details Name: Carol Keith MRN: [id number] DOB: 26-Oct-1950 Today's Date: 05/18/2016   History of Present Illness  Pt s/p Rt TKA 05/17/16  Clinical Impression  Pt presents with decreased strength, gait and ROM in Rt LE. Pt currently mod I/supervision with mobility so is safe to d/c home. If pt remains in hospital she will benefit from skilled PT to continue to progress strength and mobility goals.    Follow Up Recommendations Outpatient PT    Equipment Recommendations  None recommended by PT    Recommendations for Other Services       Precautions / Restrictions Precautions Precautions: None Restrictions Weight Bearing Restrictions: Yes RLE Weight Bearing: Weight bearing as tolerated      Mobility  Bed Mobility Overal bed mobility: Modified Independent             General bed mobility comments: increased time but pt able to move Rt LE without assistance  Transfers Overall transfer level: Needs assistance Equipment used: Rolling walker (2 wheeled) Transfers: Sit to/from [employer] Transfers Sit to Stand: Supervision Stand pivot transfers: Supervision       General transfer comment: cues for UE placement for standing wtih RW, no lifting assist needed  Ambulation/Gait Ambulation/Gait assistance: Supervision Ambulation Distance (Feet): 100 Feet Assistive device: Rolling walker (2 wheeled)       General Gait Details: decreased speed, decreased step length, pt with no episodes of buckling, no LOB  Stairs            Wheelchair Mobility    Modified Rankin (Stroke Patients Only)       Balance                                             Pertinent Vitals/Pain Pain Assessment: 0-10 Pain Score: 2  Pain Location: Rt knee Pain Descriptors / Indicators: Aching Pain Intervention(s): Limited activity within patient's tolerance;Monitored during session;RN gave pain meds during session     Home Living Family/patient expects to be discharged to:: Private residence Living Arrangements: Spouse/significant other Available Help at Discharge: Family Type of Home: House Home Access: Level entry     Home Layout: One level Home Equipment: Environmental consultant - 2 wheels      Prior Function Level of Independence: Independent with assistive device(s)         Comments: needed to use AD for 2 weeks prior to surgery due to pain     Hand Dominance        Extremity/Trunk Assessment               Lower Extremity Assessment: Generalized weakness      Cervical / Trunk Assessment: Normal  Communication   Communication: No difficulties  Cognition Arousal/Alertness: Awake/alert Behavior During Therapy: WFL for tasks assessed/performed Overall Cognitive Status: Within Functional Limits for tasks assessed                      General Comments      Exercises Total Joint Exercises Ankle Circles/Pumps: Both;20 reps Quad Sets: Right;Strengthening;10 reps Towel Squeeze: Both;10 reps;Strengthening Heel Slides: AAROM;Strengthening;Right;10 reps Hip ABduction/ADduction: AAROM;Right;10 reps;Strengthening      Assessment/Plan    PT Assessment Patient needs continued PT services  PT Diagnosis Difficulty walking;Generalized weakness;Acute pain   PT Problem List Decreased strength;Decreased range of motion;Decreased activity tolerance;Decreased balance;Decreased mobility;Pain  PT Treatment  Interventions Stair training;Gait training;DME instruction;Neuromuscular re-education;Patient/family education;Functional mobility training;Therapeutic activities;Therapeutic exercise;Balance training;Modalities   PT Goals (Current goals can be found in the Care Plan section) Acute Rehab PT Goals Patient Stated Goal: go home PT Goal Formulation: With patient Time For Goal Achievement: 06/01/16 Potential to Achieve Goals: Good    Frequency 7X/week   Barriers to discharge         Co-evaluation               End of Session   Activity Tolerance: Patient tolerated treatment well Patient left: in chair;with call bell/phone within reach Nurse Communication: Mobility status         Time: [id number] PT Time Calculation (min) (ACUTE ONLY): 38 min   Charges:   PT Evaluation $PT Eval Low Complexity: 1 Procedure PT Treatments $Gait Training: 8-22 mins $Therapeutic Exercise: 8-22 mins   PT G Codes:        Carol Keith 2016/06/14, 9:41 AM

## 2016-05-18 NOTE — Evaluation (Signed)
Occupational Therapy Evaluation and Discharge Patient Details Name: Carol Keith MRN: [id number] DOB: 1951-01-28 Today's Date: 05/18/2016    History of Present Illness Pt s/p Rt TKA 05/17/16   Clinical Impression   This 65 yo female admitted and underwent above presents to acute OT with all education completed with pt and family. No further OT needs, we will sign off.    Follow Up Recommendations  No OT follow up    Equipment Recommendations  None recommended by OT       Precautions / Restrictions Precautions Precautions: None Restrictions Weight Bearing Restrictions: Yes RLE Weight Bearing: Weight bearing as tolerated      Mobility Bed Mobility             General bed mobility comments: Pt up in recliner when I entered room  Transfers Overall transfer level: Needs assistance Equipment used: Rolling walker (2 wheeled) Transfers: Sit to/from Stand Sit to Stand: Supervision          Balance Overall balance assessment: Needs assistance Sitting-balance support: No upper extremity supported;Feet supported Sitting balance-Leahy Scale: Good     Standing balance support: During functional activity;Single extremity supported Standing balance-Leahy Scale: Fair Standing balance comment: Can stand for brief periods of time without hands on walker                            ADL Overall ADL's : Needs assistance/impaired Eating/Feeding: Independent;Sitting   Grooming: Supervision/safety;Standing   Upper Body Bathing: Set up;Sitting   Lower Body Bathing: Minimal assistance (S sit<>stand)   Upper Body Dressing : Set up;Sitting   Lower Body Dressing: Moderate assistance (S sit<>stand)   Toilet Transfer: Copy Details (indicate cue type and reason): recliner>out door down hallway>back to recliner Toileting- Clothing Manipulation and Hygiene: Supervision/safety;Sit to/from Nurse, children's Details  (indicate cue type and reason): Pt reports she will just sponge bath until she feels she can step into tub   General ADL Comments: I educated pt and family on the most efficient sequence of getting dressed and which leg needed to go in first for LBD               Pertinent Vitals/Pain Pain Assessment: 0-10 Pain Score: 4  Pain Location: right knee Pain Descriptors / Indicators: Aching;Sore Pain Intervention(s): Limited activity within patient's tolerance;Monitored during session;Repositioned;Patient requesting pain meds-RN notified     Hand Dominance Right   Extremity/Trunk Assessment Upper Extremity Assessment Upper Extremity Assessment: Overall WFL for tasks assessed     Communication Communication Communication: No difficulties   Cognition Arousal/Alertness: Awake/alert Behavior During Therapy: WFL for tasks assessed/performed Overall Cognitive Status: Within Functional Limits for tasks assessed                                Home Living Family/patient expects to be discharged to:: Private residence Living Arrangements: Spouse/significant other Available Help at Discharge: Family;Available 24 hours/day Type of Home: House Home Access: Level entry     Home Layout: One level     Bathroom Shower/Tub: Tub/shower unit;Curtain Shower/tub characteristics: Architectural technologist: Handicapped height (countertop beside it)     Home Equipment: Environmental consultant - 2 wheels          Prior Functioning/Environment Level of Independence: Independent with assistive device(s)        Comments: needed to use AD for 2 weeks prior  to surgery due to pain    OT Diagnosis: Generalized weakness;Acute pain         OT Goals(Current goals can be found in the care plan section) Acute Rehab OT Goals Patient Stated Goal: go home  OT Frequency:                End of Session Equipment Utilized During Treatment: Gait belt;Rolling walker  Activity Tolerance: Patient  tolerated treatment well Patient left: in chair;with call bell/phone within reach;with family/visitor present   Time: 1126-1150 OT Time Calculation (min): 24 min Charges:  OT General Charges $OT Visit: 1 Procedure OT Evaluation $OT Eval Moderate Complexity: 1 Procedure OT Treatments $Self Care/Home Management : 8-22 mins  Almon Register [id number] 05/18/2016, 12:36 PM

## 2016-05-18 NOTE — Progress Notes (Signed)
Inpatient Diabetes Program Recommendations  AACE/ADA: New Consensus Statement on Inpatient Glycemic Control (2015)  Target Ranges:  Prepandial:   less than 140 mg/dL      Peak postprandial:   less than 180 mg/dL (1-2 hours)      Critically ill patients:  140 - 180 mg/dL   Lab Results  Component Value Date   GLUCAP 148 (H) 05/18/2016   HGBA1C 7.2 (H) 05/06/2016    Review of Glycemic Control  Results for MYCHAEL, BIERNAT (MRN [id number]) as of 05/18/2016 11:54  Ref. Range 05/17/2016 09:55 05/17/2016 15:25 05/17/2016 23:25 05/18/2016 07:17  Glucose-Capillary Latest Ref Range: 65 - 99 mg/dL 158 (H) 185 (H) 263 (H) 148 (H)    Diabetes history: Type 2 Outpatient Diabetes medications: Byetta 52mcg bid, Amaryl 4mg /day, Levemir 45 units qhs  Current orders for Inpatient glycemic control: Levemir 45 units qhs, Amaryl 4mg /day  Inpatient Diabetes Program Recommendations: If the patient is going to remain inpatient, consider stopping the Amaryl and add Novolog 0-9 units tid with meals.  Gentry Fitz, RN, BA, MHA, CDE Diabetes Coordinator Inpatient Diabetes Program  [phone] (Team Pager) [phone] ([place]) 05/18/2016 12:04 PM

## 2016-05-18 NOTE — Progress Notes (Signed)
Sherryll Burger to be D/C'd Home per MD order.  Discussed with the patient and all questions fully answered.  VSS, Skin clean, dry and intact without evidence of skin break down, no evidence of skin tears noted. IV catheter discontinued intact. Site without signs and symptoms of complications. Dressing and pressure applied.  An After Visit Summary was printed and given to the patient. Patient received prescription.  D/c education completed with patient/family including follow up instructions, medication list, d/c activities limitations if indicated, with other d/c instructions as indicated by MD - patient able to verbalize understanding, all questions fully answered.   Patient instructed to return to ED, call 911, or call MD for any changes in condition.   Patient escorted via [place], and D/C home via private auto.  Jerry Caras 05/18/2016 2:18 PM

## 2016-05-18 NOTE — Progress Notes (Signed)
   Subjective:  Patient reports pain as mild to moderate.  No c/o. Denies N/V/CP/SOB.  Objective:   VITALS:   Vitals:   05/17/16 1730 05/17/16 1755 05/18/16 0027 05/18/16 0445  BP:  (!) 141/67 136/63 129/67  Pulse: 72 77 82 71  Resp: 16 17 17 17   Temp: 97.3 F (36.3 C) 98.1 F (36.7 C) 98.4 F (36.9 C) 97.8 F (36.6 C)  TempSrc:  Oral Oral Oral  SpO2: 100% 100% 97% 98%  Weight:        ABD soft Sensation intact distally Intact pulses distally Dorsiflexion/Plantar flexion intact Incision: dressing C/D/I Compartment soft Can already perform SLR   Lab Results  Component Value Date   WBC 9.5 05/18/2016   HGB 9.8 (L) 05/18/2016   HCT 29.8 (L) 05/18/2016   MCV 86.1 05/18/2016   PLT 222 05/18/2016   BMET    Component Value Date/Time   NA 136 05/18/2016 0355   K 3.8 05/18/2016 0355   CL 106 05/18/2016 0355   CO2 23 05/18/2016 0355   GLUCOSE 182 (H) 05/18/2016 0355   BUN 19 05/18/2016 0355   CREATININE 1.09 (H) 05/18/2016 0355   CALCIUM 9.4 05/18/2016 0355   GFRNONAA 51 (L) 05/18/2016 0355   GFRAA 59 (L) 05/18/2016 0355     Assessment/Plan: 1 Day Post-Op   Principal Problem:   Primary osteoarthritis of right knee  WBAT with walker DVT ppx: ASA, SCDs, TEDs PO pain control Advance diet Up with therapy  D/C home today vs tomorrow Out patient PT already set up, no HH required    Elie Goody 05/18/2016, 7:48 AM   Rod Can, MD Cell [phone]

## 2016-05-18 NOTE — Care Management Important Message (Signed)
Important Message  Patient Details  Name: Carol Keith MRN: [id number] Date of Birth: 03/04/1951   Medicare Important Message Given:  Yes    Loann Quill 05/18/2016, 8:39 AM

## 2016-05-19 ENCOUNTER — Encounter (HOSPITAL_COMMUNITY): Payer: Self-pay | Admitting: Orthopedic Surgery

## 2016-05-24 DIAGNOSIS — M545 Low back pain: Secondary | ICD-10-CM | POA: Diagnosis not present

## 2016-05-24 DIAGNOSIS — M549 Dorsalgia, unspecified: Secondary | ICD-10-CM | POA: Diagnosis not present

## 2016-05-24 DIAGNOSIS — M5431 Sciatica, right side: Secondary | ICD-10-CM | POA: Diagnosis not present

## 2016-06-01 DIAGNOSIS — Z96651 Presence of right artificial knee joint: Secondary | ICD-10-CM | POA: Diagnosis not present

## 2016-06-01 DIAGNOSIS — Z471 Aftercare following joint replacement surgery: Secondary | ICD-10-CM | POA: Diagnosis not present

## 2016-06-01 DIAGNOSIS — M1712 Unilateral primary osteoarthritis, left knee: Secondary | ICD-10-CM | POA: Diagnosis not present

## 2016-06-09 DIAGNOSIS — D509 Iron deficiency anemia, unspecified: Secondary | ICD-10-CM | POA: Diagnosis not present

## 2016-06-09 DIAGNOSIS — N183 Chronic kidney disease, stage 3 (moderate): Secondary | ICD-10-CM | POA: Diagnosis not present

## 2016-06-09 DIAGNOSIS — I129 Hypertensive chronic kidney disease with stage 1 through stage 4 chronic kidney disease, or unspecified chronic kidney disease: Secondary | ICD-10-CM | POA: Diagnosis not present

## 2016-06-09 DIAGNOSIS — R809 Proteinuria, unspecified: Secondary | ICD-10-CM | POA: Diagnosis not present

## 2016-06-09 DIAGNOSIS — E559 Vitamin D deficiency, unspecified: Secondary | ICD-10-CM | POA: Diagnosis not present

## 2016-06-09 DIAGNOSIS — Z79899 Other long term (current) drug therapy: Secondary | ICD-10-CM | POA: Diagnosis not present

## 2016-06-11 DIAGNOSIS — R2689 Other abnormalities of gait and mobility: Secondary | ICD-10-CM | POA: Diagnosis not present

## 2016-06-11 DIAGNOSIS — Z96651 Presence of right artificial knee joint: Secondary | ICD-10-CM | POA: Diagnosis not present

## 2016-06-11 DIAGNOSIS — Z471 Aftercare following joint replacement surgery: Secondary | ICD-10-CM | POA: Diagnosis not present

## 2016-06-15 DIAGNOSIS — E876 Hypokalemia: Secondary | ICD-10-CM | POA: Diagnosis not present

## 2016-06-15 DIAGNOSIS — N179 Acute kidney failure, unspecified: Secondary | ICD-10-CM | POA: Diagnosis not present

## 2016-06-15 DIAGNOSIS — R2689 Other abnormalities of gait and mobility: Secondary | ICD-10-CM | POA: Diagnosis not present

## 2016-06-15 DIAGNOSIS — I10 Essential (primary) hypertension: Secondary | ICD-10-CM | POA: Diagnosis not present

## 2016-06-15 DIAGNOSIS — M109 Gout, unspecified: Secondary | ICD-10-CM | POA: Diagnosis not present

## 2016-06-15 DIAGNOSIS — Z96651 Presence of right artificial knee joint: Secondary | ICD-10-CM | POA: Diagnosis not present

## 2016-06-15 DIAGNOSIS — R600 Localized edema: Secondary | ICD-10-CM | POA: Diagnosis not present

## 2016-06-15 DIAGNOSIS — Z471 Aftercare following joint replacement surgery: Secondary | ICD-10-CM | POA: Diagnosis not present

## 2016-06-16 DIAGNOSIS — Z471 Aftercare following joint replacement surgery: Secondary | ICD-10-CM | POA: Diagnosis not present

## 2016-06-16 DIAGNOSIS — R2689 Other abnormalities of gait and mobility: Secondary | ICD-10-CM | POA: Diagnosis not present

## 2016-06-16 DIAGNOSIS — Z96651 Presence of right artificial knee joint: Secondary | ICD-10-CM | POA: Diagnosis not present

## 2016-06-18 DIAGNOSIS — Z471 Aftercare following joint replacement surgery: Secondary | ICD-10-CM | POA: Diagnosis not present

## 2016-06-18 DIAGNOSIS — R2689 Other abnormalities of gait and mobility: Secondary | ICD-10-CM | POA: Diagnosis not present

## 2016-06-18 DIAGNOSIS — Z96651 Presence of right artificial knee joint: Secondary | ICD-10-CM | POA: Diagnosis not present

## 2016-06-21 DIAGNOSIS — R2689 Other abnormalities of gait and mobility: Secondary | ICD-10-CM | POA: Diagnosis not present

## 2016-06-21 DIAGNOSIS — Z471 Aftercare following joint replacement surgery: Secondary | ICD-10-CM | POA: Diagnosis not present

## 2016-06-21 DIAGNOSIS — Z96651 Presence of right artificial knee joint: Secondary | ICD-10-CM | POA: Diagnosis not present

## 2016-06-23 DIAGNOSIS — Z471 Aftercare following joint replacement surgery: Secondary | ICD-10-CM | POA: Diagnosis not present

## 2016-06-23 DIAGNOSIS — R2689 Other abnormalities of gait and mobility: Secondary | ICD-10-CM | POA: Diagnosis not present

## 2016-06-23 DIAGNOSIS — Z96651 Presence of right artificial knee joint: Secondary | ICD-10-CM | POA: Diagnosis not present

## 2016-06-25 DIAGNOSIS — Z96651 Presence of right artificial knee joint: Secondary | ICD-10-CM | POA: Diagnosis not present

## 2016-06-25 DIAGNOSIS — Z471 Aftercare following joint replacement surgery: Secondary | ICD-10-CM | POA: Diagnosis not present

## 2016-06-25 DIAGNOSIS — R2689 Other abnormalities of gait and mobility: Secondary | ICD-10-CM | POA: Diagnosis not present

## 2016-06-28 DIAGNOSIS — M545 Low back pain: Secondary | ICD-10-CM | POA: Diagnosis not present

## 2016-06-28 DIAGNOSIS — M549 Dorsalgia, unspecified: Secondary | ICD-10-CM | POA: Diagnosis not present

## 2016-06-28 DIAGNOSIS — M5431 Sciatica, right side: Secondary | ICD-10-CM | POA: Diagnosis not present

## 2016-06-29 DIAGNOSIS — Z471 Aftercare following joint replacement surgery: Secondary | ICD-10-CM | POA: Diagnosis not present

## 2016-06-29 DIAGNOSIS — M1712 Unilateral primary osteoarthritis, left knee: Secondary | ICD-10-CM | POA: Diagnosis not present

## 2016-06-30 DIAGNOSIS — Z96651 Presence of right artificial knee joint: Secondary | ICD-10-CM | POA: Diagnosis not present

## 2016-06-30 DIAGNOSIS — I2510 Atherosclerotic heart disease of native coronary artery without angina pectoris: Secondary | ICD-10-CM | POA: Diagnosis not present

## 2016-06-30 DIAGNOSIS — Z471 Aftercare following joint replacement surgery: Secondary | ICD-10-CM | POA: Diagnosis not present

## 2016-06-30 DIAGNOSIS — R2689 Other abnormalities of gait and mobility: Secondary | ICD-10-CM | POA: Diagnosis not present

## 2016-06-30 DIAGNOSIS — I10 Essential (primary) hypertension: Secondary | ICD-10-CM | POA: Diagnosis not present

## 2016-06-30 DIAGNOSIS — E119 Type 2 diabetes mellitus without complications: Secondary | ICD-10-CM | POA: Diagnosis not present

## 2016-06-30 DIAGNOSIS — E7800 Pure hypercholesterolemia, unspecified: Secondary | ICD-10-CM | POA: Diagnosis not present

## 2016-07-07 DIAGNOSIS — I129 Hypertensive chronic kidney disease with stage 1 through stage 4 chronic kidney disease, or unspecified chronic kidney disease: Secondary | ICD-10-CM | POA: Diagnosis not present

## 2016-07-07 DIAGNOSIS — N183 Chronic kidney disease, stage 3 (moderate): Secondary | ICD-10-CM | POA: Diagnosis not present

## 2016-07-07 DIAGNOSIS — Z23 Encounter for immunization: Secondary | ICD-10-CM | POA: Diagnosis not present

## 2016-07-07 DIAGNOSIS — Z79899 Other long term (current) drug therapy: Secondary | ICD-10-CM | POA: Diagnosis not present

## 2016-07-13 DIAGNOSIS — R609 Edema, unspecified: Secondary | ICD-10-CM | POA: Diagnosis not present

## 2016-07-13 DIAGNOSIS — M109 Gout, unspecified: Secondary | ICD-10-CM | POA: Diagnosis not present

## 2016-07-13 DIAGNOSIS — N183 Chronic kidney disease, stage 3 (moderate): Secondary | ICD-10-CM | POA: Diagnosis not present

## 2016-07-13 DIAGNOSIS — E876 Hypokalemia: Secondary | ICD-10-CM | POA: Diagnosis not present

## 2016-07-13 DIAGNOSIS — I10 Essential (primary) hypertension: Secondary | ICD-10-CM | POA: Diagnosis not present

## 2016-07-27 DIAGNOSIS — M1712 Unilateral primary osteoarthritis, left knee: Secondary | ICD-10-CM | POA: Diagnosis not present

## 2016-08-03 DIAGNOSIS — M1712 Unilateral primary osteoarthritis, left knee: Secondary | ICD-10-CM | POA: Diagnosis not present

## 2016-08-05 DIAGNOSIS — E1122 Type 2 diabetes mellitus with diabetic chronic kidney disease: Secondary | ICD-10-CM | POA: Diagnosis not present

## 2016-08-05 DIAGNOSIS — Z6833 Body mass index (BMI) 33.0-33.9, adult: Secondary | ICD-10-CM | POA: Diagnosis not present

## 2016-08-05 DIAGNOSIS — Z299 Encounter for prophylactic measures, unspecified: Secondary | ICD-10-CM | POA: Diagnosis not present

## 2016-08-05 DIAGNOSIS — N183 Chronic kidney disease, stage 3 (moderate): Secondary | ICD-10-CM | POA: Diagnosis not present

## 2016-08-05 DIAGNOSIS — E7800 Pure hypercholesterolemia, unspecified: Secondary | ICD-10-CM | POA: Diagnosis not present

## 2016-08-05 DIAGNOSIS — I10 Essential (primary) hypertension: Secondary | ICD-10-CM | POA: Diagnosis not present

## 2016-08-06 DIAGNOSIS — I2510 Atherosclerotic heart disease of native coronary artery without angina pectoris: Secondary | ICD-10-CM | POA: Diagnosis not present

## 2016-08-06 DIAGNOSIS — I10 Essential (primary) hypertension: Secondary | ICD-10-CM | POA: Diagnosis not present

## 2016-08-06 DIAGNOSIS — E7800 Pure hypercholesterolemia, unspecified: Secondary | ICD-10-CM | POA: Diagnosis not present

## 2016-08-06 DIAGNOSIS — E119 Type 2 diabetes mellitus without complications: Secondary | ICD-10-CM | POA: Diagnosis not present

## 2016-08-10 DIAGNOSIS — M1712 Unilateral primary osteoarthritis, left knee: Secondary | ICD-10-CM | POA: Diagnosis not present

## 2016-09-06 DIAGNOSIS — Z1231 Encounter for screening mammogram for malignant neoplasm of breast: Secondary | ICD-10-CM | POA: Diagnosis not present

## 2016-09-08 DIAGNOSIS — Z0000 Encounter for general adult medical examination without abnormal findings: Secondary | ICD-10-CM | POA: Diagnosis not present

## 2016-09-08 DIAGNOSIS — Z7189 Other specified counseling: Secondary | ICD-10-CM | POA: Diagnosis not present

## 2016-09-08 DIAGNOSIS — Z299 Encounter for prophylactic measures, unspecified: Secondary | ICD-10-CM | POA: Diagnosis not present

## 2016-09-08 DIAGNOSIS — Z1389 Encounter for screening for other disorder: Secondary | ICD-10-CM | POA: Diagnosis not present

## 2016-09-08 DIAGNOSIS — Z6832 Body mass index (BMI) 32.0-32.9, adult: Secondary | ICD-10-CM | POA: Diagnosis not present

## 2016-09-08 DIAGNOSIS — Z1211 Encounter for screening for malignant neoplasm of colon: Secondary | ICD-10-CM | POA: Diagnosis not present

## 2016-09-15 DIAGNOSIS — D509 Iron deficiency anemia, unspecified: Secondary | ICD-10-CM | POA: Diagnosis not present

## 2016-09-15 DIAGNOSIS — N183 Chronic kidney disease, stage 3 (moderate): Secondary | ICD-10-CM | POA: Diagnosis not present

## 2016-09-15 DIAGNOSIS — Z79899 Other long term (current) drug therapy: Secondary | ICD-10-CM | POA: Diagnosis not present

## 2016-09-15 DIAGNOSIS — R809 Proteinuria, unspecified: Secondary | ICD-10-CM | POA: Diagnosis not present

## 2016-09-15 DIAGNOSIS — R5383 Other fatigue: Secondary | ICD-10-CM | POA: Diagnosis not present

## 2016-09-15 DIAGNOSIS — E7800 Pure hypercholesterolemia, unspecified: Secondary | ICD-10-CM | POA: Diagnosis not present

## 2016-09-15 DIAGNOSIS — E559 Vitamin D deficiency, unspecified: Secondary | ICD-10-CM | POA: Diagnosis not present

## 2016-09-15 DIAGNOSIS — I129 Hypertensive chronic kidney disease with stage 1 through stage 4 chronic kidney disease, or unspecified chronic kidney disease: Secondary | ICD-10-CM | POA: Diagnosis not present

## 2016-09-22 DIAGNOSIS — E119 Type 2 diabetes mellitus without complications: Secondary | ICD-10-CM | POA: Diagnosis not present

## 2016-09-22 DIAGNOSIS — I10 Essential (primary) hypertension: Secondary | ICD-10-CM | POA: Diagnosis not present

## 2016-09-22 DIAGNOSIS — I2510 Atherosclerotic heart disease of native coronary artery without angina pectoris: Secondary | ICD-10-CM | POA: Diagnosis not present

## 2016-09-22 DIAGNOSIS — E7800 Pure hypercholesterolemia, unspecified: Secondary | ICD-10-CM | POA: Diagnosis not present

## 2016-09-24 DIAGNOSIS — M1712 Unilateral primary osteoarthritis, left knee: Secondary | ICD-10-CM | POA: Diagnosis not present

## 2016-09-28 DIAGNOSIS — M109 Gout, unspecified: Secondary | ICD-10-CM | POA: Diagnosis not present

## 2016-09-28 DIAGNOSIS — I10 Essential (primary) hypertension: Secondary | ICD-10-CM | POA: Diagnosis not present

## 2016-09-28 DIAGNOSIS — N189 Chronic kidney disease, unspecified: Secondary | ICD-10-CM | POA: Diagnosis not present

## 2016-09-28 DIAGNOSIS — R601 Generalized edema: Secondary | ICD-10-CM | POA: Diagnosis not present

## 2016-10-22 DIAGNOSIS — E119 Type 2 diabetes mellitus without complications: Secondary | ICD-10-CM | POA: Diagnosis not present

## 2016-10-22 DIAGNOSIS — I2510 Atherosclerotic heart disease of native coronary artery without angina pectoris: Secondary | ICD-10-CM | POA: Diagnosis not present

## 2016-10-22 DIAGNOSIS — I10 Essential (primary) hypertension: Secondary | ICD-10-CM | POA: Diagnosis not present

## 2016-10-22 DIAGNOSIS — E7800 Pure hypercholesterolemia, unspecified: Secondary | ICD-10-CM | POA: Diagnosis not present

## 2016-11-11 DIAGNOSIS — I739 Peripheral vascular disease, unspecified: Secondary | ICD-10-CM | POA: Diagnosis not present

## 2016-11-11 DIAGNOSIS — E7800 Pure hypercholesterolemia, unspecified: Secondary | ICD-10-CM | POA: Diagnosis not present

## 2016-11-11 DIAGNOSIS — Z6833 Body mass index (BMI) 33.0-33.9, adult: Secondary | ICD-10-CM | POA: Diagnosis not present

## 2016-11-11 DIAGNOSIS — Z713 Dietary counseling and surveillance: Secondary | ICD-10-CM | POA: Diagnosis not present

## 2016-11-11 DIAGNOSIS — N183 Chronic kidney disease, stage 3 (moderate): Secondary | ICD-10-CM | POA: Diagnosis not present

## 2016-11-11 DIAGNOSIS — Z789 Other specified health status: Secondary | ICD-10-CM | POA: Diagnosis not present

## 2016-11-11 DIAGNOSIS — E1122 Type 2 diabetes mellitus with diabetic chronic kidney disease: Secondary | ICD-10-CM | POA: Diagnosis not present

## 2016-11-11 DIAGNOSIS — Z299 Encounter for prophylactic measures, unspecified: Secondary | ICD-10-CM | POA: Diagnosis not present

## 2016-11-11 DIAGNOSIS — C50919 Malignant neoplasm of unspecified site of unspecified female breast: Secondary | ICD-10-CM | POA: Diagnosis not present

## 2016-11-11 DIAGNOSIS — N182 Chronic kidney disease, stage 2 (mild): Secondary | ICD-10-CM | POA: Diagnosis not present

## 2016-11-17 DIAGNOSIS — I2510 Atherosclerotic heart disease of native coronary artery without angina pectoris: Secondary | ICD-10-CM | POA: Diagnosis not present

## 2016-11-17 DIAGNOSIS — E119 Type 2 diabetes mellitus without complications: Secondary | ICD-10-CM | POA: Diagnosis not present

## 2016-11-17 DIAGNOSIS — E7800 Pure hypercholesterolemia, unspecified: Secondary | ICD-10-CM | POA: Diagnosis not present

## 2016-11-17 DIAGNOSIS — I10 Essential (primary) hypertension: Secondary | ICD-10-CM | POA: Diagnosis not present

## 2016-12-10 DIAGNOSIS — M1712 Unilateral primary osteoarthritis, left knee: Secondary | ICD-10-CM | POA: Diagnosis not present

## 2016-12-10 DIAGNOSIS — Z96651 Presence of right artificial knee joint: Secondary | ICD-10-CM | POA: Diagnosis not present

## 2016-12-10 DIAGNOSIS — Z471 Aftercare following joint replacement surgery: Secondary | ICD-10-CM | POA: Diagnosis not present

## 2016-12-13 DIAGNOSIS — Z6833 Body mass index (BMI) 33.0-33.9, adult: Secondary | ICD-10-CM | POA: Diagnosis not present

## 2016-12-13 DIAGNOSIS — Z299 Encounter for prophylactic measures, unspecified: Secondary | ICD-10-CM | POA: Diagnosis not present

## 2016-12-13 DIAGNOSIS — I10 Essential (primary) hypertension: Secondary | ICD-10-CM | POA: Diagnosis not present

## 2016-12-13 DIAGNOSIS — R350 Frequency of micturition: Secondary | ICD-10-CM | POA: Diagnosis not present

## 2016-12-13 DIAGNOSIS — N182 Chronic kidney disease, stage 2 (mild): Secondary | ICD-10-CM | POA: Diagnosis not present

## 2016-12-13 DIAGNOSIS — E1122 Type 2 diabetes mellitus with diabetic chronic kidney disease: Secondary | ICD-10-CM | POA: Diagnosis not present

## 2016-12-13 DIAGNOSIS — Z789 Other specified health status: Secondary | ICD-10-CM | POA: Diagnosis not present

## 2016-12-13 DIAGNOSIS — N8110 Cystocele, unspecified: Secondary | ICD-10-CM | POA: Diagnosis not present

## 2016-12-13 DIAGNOSIS — Z713 Dietary counseling and surveillance: Secondary | ICD-10-CM | POA: Diagnosis not present

## 2016-12-13 DIAGNOSIS — N183 Chronic kidney disease, stage 3 (moderate): Secondary | ICD-10-CM | POA: Diagnosis not present

## 2016-12-22 ENCOUNTER — Ambulatory Visit: Payer: Self-pay | Admitting: Orthopedic Surgery

## 2016-12-23 DIAGNOSIS — E1122 Type 2 diabetes mellitus with diabetic chronic kidney disease: Secondary | ICD-10-CM | POA: Diagnosis not present

## 2016-12-23 DIAGNOSIS — I10 Essential (primary) hypertension: Secondary | ICD-10-CM | POA: Diagnosis not present

## 2016-12-23 DIAGNOSIS — E7800 Pure hypercholesterolemia, unspecified: Secondary | ICD-10-CM | POA: Diagnosis not present

## 2016-12-23 DIAGNOSIS — N183 Chronic kidney disease, stage 3 (moderate): Secondary | ICD-10-CM | POA: Diagnosis not present

## 2016-12-23 DIAGNOSIS — N182 Chronic kidney disease, stage 2 (mild): Secondary | ICD-10-CM | POA: Diagnosis not present

## 2016-12-23 DIAGNOSIS — M1710 Unilateral primary osteoarthritis, unspecified knee: Secondary | ICD-10-CM | POA: Diagnosis not present

## 2016-12-23 DIAGNOSIS — Z713 Dietary counseling and surveillance: Secondary | ICD-10-CM | POA: Diagnosis not present

## 2016-12-23 DIAGNOSIS — Z6833 Body mass index (BMI) 33.0-33.9, adult: Secondary | ICD-10-CM | POA: Diagnosis not present

## 2016-12-23 DIAGNOSIS — Z299 Encounter for prophylactic measures, unspecified: Secondary | ICD-10-CM | POA: Diagnosis not present

## 2016-12-27 DIAGNOSIS — I10 Essential (primary) hypertension: Secondary | ICD-10-CM | POA: Diagnosis not present

## 2016-12-27 DIAGNOSIS — I2510 Atherosclerotic heart disease of native coronary artery without angina pectoris: Secondary | ICD-10-CM | POA: Diagnosis not present

## 2016-12-27 DIAGNOSIS — E119 Type 2 diabetes mellitus without complications: Secondary | ICD-10-CM | POA: Diagnosis not present

## 2016-12-27 DIAGNOSIS — E7800 Pure hypercholesterolemia, unspecified: Secondary | ICD-10-CM | POA: Diagnosis not present

## 2016-12-31 ENCOUNTER — Ambulatory Visit: Payer: Self-pay | Admitting: Orthopedic Surgery

## 2016-12-31 NOTE — H&P (Signed)
TOTAL KNEE ADMISSION H&P  Patient is being admitted for left total knee arthroplasty.  Subjective:  Chief Complaint:left knee pain.  HPI: Carol Keith, 66 y.o. female, has a history of pain and functional disability in the left knee due to arthritis and has failed non-surgical conservative treatments for greater than 12 weeks to includeNSAID's and/or analgesics, corticosteriod injections, viscosupplementation injections, flexibility and strengthening excercises, supervised PT with diminished ADL's post treatment, use of assistive devices, weight reduction as appropriate and activity modification.  Onset of symptoms was gradual, starting 3 years ago with gradually worsening course since that time. The patient noted no past surgery on the left knee(s).  Patient currently rates pain in the left knee(s) at 10 out of 10 with activity. Patient has night pain, worsening of pain with activity and weight bearing, pain that interferes with activities of daily living, pain with passive range of motion, crepitus and joint swelling.  Patient has evidence of subchondral cysts, subchondral sclerosis, periarticular osteophytes and joint space narrowing by imaging studies. There is no active infection.  Patient Active Problem List   Diagnosis Date Noted  . Primary osteoarthritis of right knee 05/17/2016   Past Medical History:  Diagnosis Date  . Arthritis    kneees, back   . Cancer [facility])    breast cancer  . Chronic kidney disease    appt. schedule for Dr. Lynnette Caffey, [id number]  . Complication of anesthesia   . Diabetes mellitus without complication ([place])    diagnosed x10 yrs. ago  . Heart murmur   . Hypertension   . PONV (postoperative nausea and vomiting)     Past Surgical History:  Procedure Laterality Date  . ABDOMINAL HYSTERECTOMY    . APPENDECTOMY    . BREAST SURGERY Right    for Breast Ca, tx with surg. & chemo & radiation    . CHOLECYSTECTOMY    . KNEE ARTHROPLASTY Right 05/17/2016   Procedure: RIGHT TOTAL KNEE ARTHROPLASTY WITH COMPUTER NAVIGATION;  Surgeon: Rod Can, MD;  Location: [place];  Service: Orthopedics;  Laterality: Right;  Needs RNFA  . LAPAROSCOPY  1999   done at [facility], "something with separating my organs because things had grown together"  . TONSILLECTOMY    . TUBAL LIGATION       (Not in a hospital admission) Allergies  Allergen Reactions  . No Known Allergies     Social History  Substance Use Topics  . Smoking status: Never Smoker  . Smokeless tobacco: Never Used  . Alcohol use No    No family history on file.   Review of Systems  Constitutional: Negative.   HENT: Positive for tinnitus.   Eyes: Positive for blurred vision.  Respiratory: Negative.   Cardiovascular: Negative.   Gastrointestinal: Positive for constipation.  Genitourinary: Positive for urgency.  Musculoskeletal: Positive for back pain and joint pain.  Skin: Negative.   Neurological: Positive for dizziness.  Endo/Heme/Allergies: Negative.   Psychiatric/Behavioral: Negative.     Objective:  Physical Exam  Vitals reviewed. Constitutional: She is oriented to person, place, and time. She appears well-developed and well-nourished.  HENT:  Head: Normocephalic and atraumatic.  Eyes: Conjunctivae and EOM are normal. Pupils are equal, round, and reactive to light.  Neck: Normal range of motion. Neck supple. No thyromegaly present.  Cardiovascular: Normal rate, regular rhythm and intact distal pulses.   Respiratory: Effort normal. No respiratory distress.  GI: Soft. She exhibits no distension.  Genitourinary:  Genitourinary Comments: deferred  Musculoskeletal:  Left knee: She exhibits decreased range of motion and abnormal alignment. Tenderness found. Medial joint line and lateral joint line tenderness noted.  Neurological: She is alert and oriented to person, place, and time. She has normal reflexes.  Skin: Skin is warm and dry.  Psychiatric: She has a normal  mood and affect. Her behavior is normal. Judgment and thought content normal.    Vital signs in last 24 hours: @VSRANGES @  Labs:   Estimated body mass index is 32.42 kg/m as calculated from the following:   Height as of 05/06/16: 5\' 3"  (1.6 m).   Weight as of 05/17/16: 83 kg (183 lb).   Imaging Review Plain radiographs demonstrate severe degenerative joint disease of the left knee(s). The overall alignment issignificant varus. The bone quality appears to be adequate for age and reported activity level.  Assessment/Plan:  End stage arthritis, left knee   The patient history, physical examination, clinical judgment of the provider and imaging studies are consistent with end stage degenerative joint disease of the left knee(s) and total knee arthroplasty is deemed medically necessary. The treatment options including medical management, injection therapy arthroscopy and arthroplasty were discussed at length. The risks and benefits of total knee arthroplasty were presented and reviewed. The risks due to aseptic loosening, infection, stiffness, patella tracking problems, thromboembolic complications and other imponderables were discussed. The patient acknowledged the explanation, agreed to proceed with the plan and consent was signed. Patient is being admitted for inpatient treatment for surgery, pain control, PT, OT, prophylactic antibiotics, VTE prophylaxis, progressive ambulation and ADL's and discharge planning. The patient is planning to be discharged home with outpatient PT

## 2017-01-03 DIAGNOSIS — Z01419 Encounter for gynecological examination (general) (routine) without abnormal findings: Secondary | ICD-10-CM | POA: Diagnosis not present

## 2017-01-03 DIAGNOSIS — Z1272 Encounter for screening for malignant neoplasm of vagina: Secondary | ICD-10-CM | POA: Diagnosis not present

## 2017-01-03 DIAGNOSIS — R102 Pelvic and perineal pain: Secondary | ICD-10-CM | POA: Diagnosis not present

## 2017-01-03 DIAGNOSIS — N9489 Other specified conditions associated with female genital organs and menstrual cycle: Secondary | ICD-10-CM | POA: Diagnosis not present

## 2017-01-06 DIAGNOSIS — M109 Gout, unspecified: Secondary | ICD-10-CM | POA: Diagnosis not present

## 2017-01-06 DIAGNOSIS — I10 Essential (primary) hypertension: Secondary | ICD-10-CM | POA: Diagnosis not present

## 2017-01-06 DIAGNOSIS — E119 Type 2 diabetes mellitus without complications: Secondary | ICD-10-CM | POA: Diagnosis not present

## 2017-01-06 DIAGNOSIS — E7800 Pure hypercholesterolemia, unspecified: Secondary | ICD-10-CM | POA: Diagnosis not present

## 2017-01-06 DIAGNOSIS — K635 Polyp of colon: Secondary | ICD-10-CM | POA: Diagnosis not present

## 2017-01-06 DIAGNOSIS — D126 Benign neoplasm of colon, unspecified: Secondary | ICD-10-CM | POA: Diagnosis not present

## 2017-01-06 DIAGNOSIS — K640 First degree hemorrhoids: Secondary | ICD-10-CM | POA: Diagnosis not present

## 2017-01-06 DIAGNOSIS — I739 Peripheral vascular disease, unspecified: Secondary | ICD-10-CM | POA: Diagnosis not present

## 2017-01-06 DIAGNOSIS — Z853 Personal history of malignant neoplasm of breast: Secondary | ICD-10-CM | POA: Diagnosis not present

## 2017-01-06 DIAGNOSIS — E785 Hyperlipidemia, unspecified: Secondary | ICD-10-CM | POA: Diagnosis not present

## 2017-01-06 DIAGNOSIS — K219 Gastro-esophageal reflux disease without esophagitis: Secondary | ICD-10-CM | POA: Diagnosis not present

## 2017-01-06 DIAGNOSIS — M1990 Unspecified osteoarthritis, unspecified site: Secondary | ICD-10-CM | POA: Diagnosis not present

## 2017-01-06 DIAGNOSIS — Z1211 Encounter for screening for malignant neoplasm of colon: Secondary | ICD-10-CM | POA: Diagnosis not present

## 2017-01-17 ENCOUNTER — Encounter (HOSPITAL_COMMUNITY): Payer: Self-pay | Admitting: *Deleted

## 2017-01-17 ENCOUNTER — Encounter (HOSPITAL_COMMUNITY)
Admission: RE | Admit: 2017-01-17 | Discharge: 2017-01-17 | Disposition: A | Discharge status: Home / Self Care | Payer: Medicare Other | Source: Ambulatory Visit | Attending: Orthopedic Surgery | Admitting: Orthopedic Surgery

## 2017-01-17 DIAGNOSIS — M1712 Unilateral primary osteoarthritis, left knee: Secondary | ICD-10-CM | POA: Diagnosis not present

## 2017-01-17 DIAGNOSIS — Z01812 Encounter for preprocedural laboratory examination: Secondary | ICD-10-CM | POA: Diagnosis not present

## 2017-01-17 LAB — TYPE AND SCREEN
ABO/RH(D): O POS
ANTIBODY SCREEN: NEGATIVE

## 2017-01-17 LAB — BASIC METABOLIC PANEL
ANION GAP: 13 (ref 5–15)
BUN: 16 mg/dL (ref 6–20)
CHLORIDE: 103 mmol/L (ref 101–111)
CO2: 27 mmol/L (ref 22–32)
Calcium: 10.2 mg/dL (ref 8.9–10.3)
Creatinine, Ser: 1.06 mg/dL — ABNORMAL HIGH (ref 0.44–1.00)
GFR calc Af Amer: >60 mL/min (ref >60)
GFR, EST NON AFRICAN AMERICAN: 53 mL/min — AB (ref >60)
GLUCOSE: 68 mg/dL (ref 65–99)
POTASSIUM: 3.1 mmol/L — AB (ref 3.5–5.1)
SODIUM: 143 mmol/L (ref 135–145)

## 2017-01-17 LAB — CBC
HCT: 35.8 % — ABNORMAL LOW (ref 36.0–46.0)
Hemoglobin: 11.9 g/dL — ABNORMAL LOW (ref 12.0–15.0)
MCH: 28.5 pg (ref 26.0–34.0)
MCHC: 33.2 g/dL (ref 30.0–36.0)
MCV: 85.6 fL (ref 78.0–100.0)
PLATELETS: 247 10*3/uL (ref 150–400)
RBC: 4.18 MIL/uL (ref 3.87–5.11)
RDW: 13.7 % (ref 11.5–15.5)
WBC: 7.4 10*3/uL (ref 4.0–10.5)

## 2017-01-17 LAB — GLUCOSE, CAPILLARY: GLUCOSE-CAPILLARY: 81 mg/dL (ref 65–99)

## 2017-01-17 LAB — SURGICAL PCR SCREEN
MRSA, PCR: NEGATIVE
Staphylococcus aureus: NEGATIVE

## 2017-01-17 NOTE — Pre-Procedure Instructions (Addendum)
Carol Keith  01/17/2017      [place] [place] [address] Phone: [phone] Fax: [phone]    Your procedure is scheduled on 01/24/17  Report to [facility] Admitting at 945 A.M.  Call this number if you have problems the morning of surgery:  [phone]   Remember:  Do not eat food or drink liquids after midnight.  Take these medicines the morning of surgery with A SIP OF WATER     Allopurinol,amlodipine,metoprolol  STOP all herbel meds, nsaids (aleve,naproxen,advil,ibuprofen)7 days prior to surgery starting TODAY 01/17/17 including all vitamins/supplements, aspirin, diclofenac  NO glimepiride day of surgery    How to Manage Your Diabetes Before and After Surgery  Why is it important to control my blood sugar before and after surgery? . Improving blood sugar levels before and after surgery helps healing and can limit problems. . A way of improving blood sugar control is eating a healthy diet by: o  Eating less sugar and carbohydrates o  Increasing activity/exercise o  Talking with your doctor about reaching your blood sugar goals . High blood sugars (greater than 180 mg/dL) can raise your risk of infections and slow your recovery, so you will need to focus on controlling your diabetes during the weeks before surgery. . Make sure that the doctor who takes care of your diabetes knows about your planned surgery including the date and location.  How do I manage my blood sugar before surgery? . Check your blood sugar at least 4 times a day, starting 2 days before surgery, to make sure that the level is not too high or low. o Check your blood sugar the morning of your surgery when you wake up and every 2 hours until you get to the Short Stay unit. . If your blood sugar is less than 70 mg/dL, you will need to treat for low blood sugar: o Do not take insulin. o Treat a low blood sugar (less than 70 mg/dL)  with  cup of clear juice (cranberry or apple), 4 glucose tablets, OR glucose gel. o Recheck blood sugar in 15 minutes after treatment (to make sure it is greater than 70 mg/dL). If your blood sugar is not greater than 70 mg/dL on recheck, call [phone] for further instructions. . Report your blood sugar to the short stay nurse when you get to Short Stay.  . If you are admitted to the hospital after surgery: o Your blood sugar will be checked by the staff and you will probably be given insulin after surgery (instead of oral diabetes medicines) to make sure you have good blood sugar levels. o The goal for blood sugar control after surgery is 80-180 mg/dL.    WHAT DO I DO ABOUT MY DIABETES MEDICATION?   Marland Kitchen Do not take oral diabetes medicines (pills) the morning of surgery.(glimepiride)  THE NIGHT BEFORE SURGERY, take 22 units of toujeo     Do not wear jewelry, make-up or nail polish.  Do not wear lotions, powders, or perfumes, or deoderant.  Do not shave 48 hours prior to surgery.  Men may shave face and neck.  Do not bring valuables to the hospital.  [facility] is not responsible for any belongings or valuables.  Contacts, dentures or bridgework may not be worn into surgery.  Leave your suitcase in the car.  After surgery it may be brought to your room.  For  patients admitted to the hospital, discharge time will be determined by your treatment team.  Patients discharged the day of surgery will not be allowed to drive home.   Special instructions:  Special Instructions: Carol Keith - Preparing for Surgery  Before surgery, you can play an important role.  Because skin is not sterile, your skin needs to be as free of germs as possible.  You can reduce the number of germs on you skin by washing with CHG (chlorahexidine gluconate) soap before surgery.  CHG is an antiseptic cleaner which kills germs and bonds with the skin to continue killing germs even after washing.  Please DO NOT use  if you have an allergy to CHG or antibacterial soaps.  If your skin becomes reddened/irritated stop using the CHG and inform your nurse when you arrive at Short Stay.  Do not shave (including legs and underarms) for at least 48 hours prior to the first CHG shower.  You may shave your face.  Please follow these instructions carefully:   1.  Shower with CHG Soap the night before surgery and the morning of Surgery.  2.  If you choose to wash your hair, wash your hair first as usual with your normal shampoo.  3.  After you shampoo, rinse your hair and body thoroughly to remove the Shampoo.  4.  Use CHG as you would any other liquid soap.  You can apply chg directly  to the skin and wash gently with scrungie or a clean washcloth.  5.  Apply the CHG Soap to your body ONLY FROM THE NECK DOWN.  Do not use on open wounds or open sores.  Avoid contact with your eyes ears, mouth and genitals (private parts).  Wash genitals (private parts)       with your normal soap.  6.  Wash thoroughly, paying special attention to the area where your surgery will be performed.  7.  Thoroughly rinse your body with warm water from the neck down.  8.  DO NOT shower/wash with your normal soap after using and rinsing off the CHG Soap.  9.  Pat yourself dry with a clean towel.            10.  Wear clean pajamas.            11.  Place clean sheets on your bed the night of your first shower and do not sleep with pets.  Day of Surgery  Do not apply any lotions/deodorants the morning of surgery.  Please wear clean clothes to the hospital/surgery center.  Please read over the fact sheets that you were given.

## 2017-01-18 LAB — HEMOGLOBIN A1C
HEMOGLOBIN A1C: 6.6 % — AB (ref 4.8–5.6)
MEAN PLASMA GLUCOSE: 143 mg/dL

## 2017-01-19 NOTE — Progress Notes (Signed)
Anesthesia Chart Review:  Pt is a 66 year old female scheduled for L total knee arthroplasty with computer navigation on 01/24/2017 with Rod Can, M.D.  PCP is Monico Blitz, MD who cleared pt for surgery at last office visit 12/23/16.Marland Kitchen   PMH includes:  HTN, heart murmur, DM, CKD (stage 3), breast cancer, post-op N/V. Never smoker. BMI 33. S/p R TKA 05/17/16.   Medications include: amlodipine, ASA, exenatide, glimepiride, levemir, losartan-hctz, metoprolol, potassium  Preoperative labs reviewed.   - HgbA1c 6.6, glucose 68.   EKG 05/06/16: NSR.   Echo 09/01/15 [facility] internal medicine): 1. LV normal in size. EF 50-55%. Diastolic function is reduced 2. Mild tricuspid regurgitation.  If no changes, I anticipate pt can proceed with surgery as scheduled.   Willeen Cass, FNP-BC [facility] Short Stay [facility]/Anesthesiology Phone: [phone] 01/19/2017 2:50 PM

## 2017-01-21 ENCOUNTER — Other Ambulatory Visit: Payer: Self-pay | Admitting: Orthopedic Surgery

## 2017-01-21 MED ORDER — CEFAZOLIN SODIUM-DEXTROSE 2-4 GM/100ML-% IV SOLN
2.0000 g | INTRAVENOUS | Status: AC
  Administered 2017-01-24: 2 g via INTRAVENOUS
  Filled 2017-01-21: qty 100

## 2017-01-21 MED ORDER — ACETAMINOPHEN 10 MG/ML IV SOLN
1000.0000 mg | INTRAVENOUS | Status: AC
  Administered 2017-01-24: 1000 mg via INTRAVENOUS
  Filled 2017-01-21: qty 100

## 2017-01-21 MED ORDER — TRANEXAMIC ACID 1000 MG/10ML IV SOLN
1000.0000 mg | INTRAVENOUS | Status: AC
  Administered 2017-01-24: 1000 mg via INTRAVENOUS
  Filled 2017-01-21: qty 10

## 2017-01-21 MED ORDER — SODIUM CHLORIDE 0.9 % IV SOLN: INTRAVENOUS | Status: DC

## 2017-01-23 NOTE — Anesthesia Preprocedure Evaluation (Addendum)
Anesthesia Evaluation  Patient identified by MRN, date of birth, ID band Patient awake    Reviewed: Allergy & Precautions, NPO status , Patient's Chart, lab work & pertinent test results  History of Anesthesia Complications (+) PONV and history of anesthetic complications  Airway Mallampati: II  TM Distance: >3 FB Neck ROM: Full    Dental no notable dental hx. (+) Dental Advisory Given   Pulmonary neg pulmonary ROS, neg shortness of breath, neg sleep apnea, neg COPD, neg recent URI,    Pulmonary exam normal breath sounds clear to auscultation       Cardiovascular hypertension, Pt. on medications and Pt. on home beta blockers (-) angina(-) Past MI, (-) Cardiac Stents and (-) Orthopnea Normal cardiovascular exam(-) pacemaker+ Valvular Problems/Murmurs  - Systolic murmurs    Neuro/Psych neg Seizures negative neurological ROS  negative psych ROS   GI/Hepatic negative GI ROS, Neg liver ROS,   Endo/Other  diabetes, Well Controlled, Type 2, Insulin Dependent, Oral Hypoglycemic Agents  Renal/GU Renal InsufficiencyRenal disease     Musculoskeletal  (+) Arthritis ,   Abdominal (+) - obese,   Peds  Hematology   Anesthesia Other Findings H/o breast cancer  Reproductive/Obstetrics                            Anesthesia Physical  Anesthesia Plan  ASA: III  Anesthesia Plan: General   Post-op Pain Management: GA combined w/ Regional for post-op pain   Induction: Intravenous  Airway Management Planned: LMA  Additional Equipment:   Intra-op Plan:   Post-operative Plan: Extubation in OR  Informed Consent: I have reviewed the patients History and Physical, chart, labs and discussed the procedure including the risks, benefits and alternatives for the proposed anesthesia with the patient or authorized representative who has indicated his/her understanding and acceptance.   Dental advisory  given  Plan Discussed with: Anesthesiologist  Anesthesia Plan Comments:        Anesthesia Quick Evaluation

## 2017-01-24 ENCOUNTER — Inpatient Hospital Stay (HOSPITAL_COMMUNITY): Payer: Medicare Other | Admitting: Anesthesiology

## 2017-01-24 ENCOUNTER — Inpatient Hospital Stay (HOSPITAL_COMMUNITY): Payer: Medicare Other | Admitting: Vascular Surgery

## 2017-01-24 ENCOUNTER — Inpatient Hospital Stay (HOSPITAL_COMMUNITY)
Admission: RE | Admit: 2017-01-24 | Discharge: 2017-01-26 | DRG: 470 | Disposition: A | Discharge status: Home / Self Care | Payer: Medicare Other | Source: Ambulatory Visit | Attending: Orthopedic Surgery | Admitting: Orthopedic Surgery

## 2017-01-24 ENCOUNTER — Encounter (HOSPITAL_COMMUNITY): Payer: Self-pay | Admitting: Urology

## 2017-01-24 ENCOUNTER — Inpatient Hospital Stay (HOSPITAL_COMMUNITY): Payer: Medicare Other

## 2017-01-24 ENCOUNTER — Encounter (HOSPITAL_COMMUNITY)
Admission: RE | Disposition: A | Discharge status: Home / Self Care | Payer: Self-pay | Source: Ambulatory Visit | Attending: Orthopedic Surgery

## 2017-01-24 DIAGNOSIS — E119 Type 2 diabetes mellitus without complications: Secondary | ICD-10-CM | POA: Diagnosis present

## 2017-01-24 DIAGNOSIS — E1122 Type 2 diabetes mellitus with diabetic chronic kidney disease: Secondary | ICD-10-CM | POA: Diagnosis present

## 2017-01-24 DIAGNOSIS — Z79899 Other long term (current) drug therapy: Secondary | ICD-10-CM

## 2017-01-24 DIAGNOSIS — Z794 Long term (current) use of insulin: Secondary | ICD-10-CM | POA: Diagnosis not present

## 2017-01-24 DIAGNOSIS — Z853 Personal history of malignant neoplasm of breast: Secondary | ICD-10-CM

## 2017-01-24 DIAGNOSIS — Z471 Aftercare following joint replacement surgery: Secondary | ICD-10-CM | POA: Diagnosis not present

## 2017-01-24 DIAGNOSIS — E876 Hypokalemia: Secondary | ICD-10-CM | POA: Diagnosis not present

## 2017-01-24 DIAGNOSIS — Z09 Encounter for follow-up examination after completed treatment for conditions other than malignant neoplasm: Secondary | ICD-10-CM

## 2017-01-24 DIAGNOSIS — R1110 Vomiting, unspecified: Secondary | ICD-10-CM | POA: Diagnosis not present

## 2017-01-24 DIAGNOSIS — I10 Essential (primary) hypertension: Secondary | ICD-10-CM | POA: Diagnosis not present

## 2017-01-24 DIAGNOSIS — G8918 Other acute postprocedural pain: Secondary | ICD-10-CM | POA: Diagnosis not present

## 2017-01-24 DIAGNOSIS — D62 Acute posthemorrhagic anemia: Secondary | ICD-10-CM | POA: Diagnosis not present

## 2017-01-24 DIAGNOSIS — I129 Hypertensive chronic kidney disease with stage 1 through stage 4 chronic kidney disease, or unspecified chronic kidney disease: Secondary | ICD-10-CM | POA: Diagnosis present

## 2017-01-24 DIAGNOSIS — M1712 Unilateral primary osteoarthritis, left knee: Principal | ICD-10-CM | POA: Diagnosis present

## 2017-01-24 DIAGNOSIS — Z6832 Body mass index (BMI) 32.0-32.9, adult: Secondary | ICD-10-CM

## 2017-01-24 DIAGNOSIS — Z96651 Presence of right artificial knee joint: Secondary | ICD-10-CM | POA: Diagnosis present

## 2017-01-24 DIAGNOSIS — E669 Obesity, unspecified: Secondary | ICD-10-CM | POA: Diagnosis present

## 2017-01-24 DIAGNOSIS — N189 Chronic kidney disease, unspecified: Secondary | ICD-10-CM | POA: Diagnosis present

## 2017-01-24 DIAGNOSIS — Z96652 Presence of left artificial knee joint: Secondary | ICD-10-CM | POA: Diagnosis not present

## 2017-01-24 HISTORY — PX: KNEE ARTHROPLASTY: SHX992

## 2017-01-24 LAB — GLUCOSE, CAPILLARY
GLUCOSE-CAPILLARY: 133 mg/dL — AB (ref 65–99)
GLUCOSE-CAPILLARY: 175 mg/dL — AB (ref 65–99)
GLUCOSE-CAPILLARY: 148 mg/dL — AB (ref 65–99)
Glucose-Capillary: 188 mg/dL — ABNORMAL HIGH (ref 65–99)

## 2017-01-24 SURGERY — ARTHROPLASTY, KNEE, TOTAL, USING IMAGELESS COMPUTER-ASSISTED NAVIGATION
Anesthesia: General | Site: Knee | Laterality: Left

## 2017-01-24 MED ORDER — MENTHOL 3 MG MT LOZG: 1.0000 | LOZENGE | OROMUCOSAL | Status: DC | PRN

## 2017-01-24 MED ORDER — BIOTIN 1000 MCG PO TABS: 1000.0000 ug | ORAL_TABLET | ORAL | Status: DC

## 2017-01-24 MED ORDER — ACETAMINOPHEN 325 MG PO TABS: 650.0000 mg | ORAL_TABLET | Freq: Four times a day (QID) | ORAL | Status: DC | PRN

## 2017-01-24 MED ORDER — INSULIN GLARGINE 100 UNIT/ML ~~LOC~~ SOLN
45.0000 [IU] | Freq: Every day | SUBCUTANEOUS | Status: DC
  Administered 2017-01-24 – 2017-01-25 (×2): 45 [IU] via SUBCUTANEOUS
  Filled 2017-01-24 (×2): qty 0.45

## 2017-01-24 MED ORDER — HYDROMORPHONE HCL 1 MG/ML IJ SOLN: 0.2500 mg | INTRAMUSCULAR | Status: DC | PRN

## 2017-01-24 MED ORDER — TRANEXAMIC ACID 1000 MG/10ML IV SOLN
1000.0000 mg | Freq: Once | INTRAVENOUS | Status: AC
  Administered 2017-01-24: 1000 mg via INTRAVENOUS
  Filled 2017-01-24: qty 10

## 2017-01-24 MED ORDER — SENNA 8.6 MG PO TABS
2.0000 | ORAL_TABLET | Freq: Every day | ORAL | Status: DC
  Administered 2017-01-24 – 2017-01-25 (×2): 17.2 mg via ORAL
  Filled 2017-01-24 (×2): qty 2

## 2017-01-24 MED ORDER — METHOCARBAMOL 1000 MG/10ML IJ SOLN
500.0000 mg | Freq: Four times a day (QID) | INTRAVENOUS | Status: DC | PRN
  Filled 2017-01-24: qty 5

## 2017-01-24 MED ORDER — METOCLOPRAMIDE HCL 5 MG/ML IJ SOLN
5.0000 mg | Freq: Three times a day (TID) | INTRAMUSCULAR | Status: DC | PRN
  Administered 2017-01-24: 10 mg via INTRAVENOUS
  Filled 2017-01-24: qty 2

## 2017-01-24 MED ORDER — ACETAMINOPHEN 10 MG/ML IV SOLN
INTRAVENOUS | Status: AC
  Filled 2017-01-24: qty 100

## 2017-01-24 MED ORDER — SODIUM CHLORIDE 0.9 % IJ SOLN
INTRAMUSCULAR | Status: DC | PRN
  Administered 2017-01-24 (×2): 10 mL

## 2017-01-24 MED ORDER — FENTANYL CITRATE (PF) 250 MCG/5ML IJ SOLN
INTRAMUSCULAR | Status: AC
Start: 2017-01-24 — End: 2017-01-24
  Filled 2017-01-24: qty 5

## 2017-01-24 MED ORDER — FENTANYL CITRATE (PF) 100 MCG/2ML IJ SOLN
INTRAMUSCULAR | Status: DC | PRN
  Administered 2017-01-24 (×2): 50 ug via INTRAVENOUS
  Administered 2017-01-24: 100 ug via INTRAVENOUS
  Administered 2017-01-24: 50 ug via INTRAVENOUS
  Administered 2017-01-24: 100 ug via INTRAVENOUS

## 2017-01-24 MED ORDER — PROPOFOL 500 MG/50ML IV EMUL
INTRAVENOUS | Status: AC
  Filled 2017-01-24: qty 50

## 2017-01-24 MED ORDER — ALLOPURINOL 100 MG PO TABS
100.0000 mg | ORAL_TABLET | Freq: Every day | ORAL | Status: DC
  Administered 2017-01-24 – 2017-01-25 (×2): 100 mg via ORAL
  Filled 2017-01-24 (×2): qty 1

## 2017-01-24 MED ORDER — ROCURONIUM BROMIDE 100 MG/10ML IV SOLN
INTRAVENOUS | Status: DC | PRN
  Administered 2017-01-24: 50 mg via INTRAVENOUS

## 2017-01-24 MED ORDER — SCOPOLAMINE 1 MG/3DAYS TD PT72
1.0000 | MEDICATED_PATCH | TRANSDERMAL | Status: DC
  Administered 2017-01-24: 1.5 mg via TRANSDERMAL

## 2017-01-24 MED ORDER — ONDANSETRON HCL 4 MG PO TABS: 4.0000 mg | ORAL_TABLET | Freq: Four times a day (QID) | ORAL | Status: DC | PRN

## 2017-01-24 MED ORDER — LOSARTAN POTASSIUM-HCTZ 100-25 MG PO TABS: 1.0000 | ORAL_TABLET | Freq: Every day | ORAL | Status: DC

## 2017-01-24 MED ORDER — HYDROCHLOROTHIAZIDE 25 MG PO TABS
25.0000 mg | ORAL_TABLET | Freq: Every day | ORAL | Status: DC
  Administered 2017-01-25 – 2017-01-26 (×2): 25 mg via ORAL
  Filled 2017-01-24 (×2): qty 1

## 2017-01-24 MED ORDER — DOCUSATE SODIUM 100 MG PO CAPS
100.0000 mg | ORAL_CAPSULE | Freq: Two times a day (BID) | ORAL | Status: DC
  Administered 2017-01-24 – 2017-01-26 (×5): 100 mg via ORAL
  Filled 2017-01-24 (×5): qty 1

## 2017-01-24 MED ORDER — KETOROLAC TROMETHAMINE 30 MG/ML IJ SOLN
INTRAMUSCULAR | Status: DC | PRN
Start: 2017-01-24 — End: 2017-01-24
  Administered 2017-01-24: 30 mg

## 2017-01-24 MED ORDER — BUPIVACAINE HCL (PF) 0.5 % IJ SOLN
INTRAMUSCULAR | Status: AC
  Filled 2017-01-24: qty 30

## 2017-01-24 MED ORDER — CHLORHEXIDINE GLUCONATE 4 % EX LIQD: 60.0000 mL | Freq: Once | CUTANEOUS | Status: DC

## 2017-01-24 MED ORDER — PROPOFOL 10 MG/ML IV BOLUS
INTRAVENOUS | Status: DC | PRN
  Administered 2017-01-24: 150 mg via INTRAVENOUS
  Administered 2017-01-24: 50 mg via INTRAVENOUS

## 2017-01-24 MED ORDER — POVIDONE-IODINE 10 % EX SWAB: 2.0000 "application " | Freq: Once | CUTANEOUS | Status: DC

## 2017-01-24 MED ORDER — ASPIRIN 81 MG PO CHEW
81.0000 mg | CHEWABLE_TABLET | Freq: Two times a day (BID) | ORAL | Status: DC
  Administered 2017-01-24 – 2017-01-26 (×4): 81 mg via ORAL
  Filled 2017-01-24 (×4): qty 1

## 2017-01-24 MED ORDER — HYDROMORPHONE HCL 1 MG/ML IJ SOLN
0.2500 mg | INTRAMUSCULAR | Status: DC | PRN
  Administered 2017-01-24 (×2): 0.5 mg via INTRAVENOUS

## 2017-01-24 MED ORDER — ONDANSETRON HCL 4 MG/2ML IJ SOLN
4.0000 mg | Freq: Four times a day (QID) | INTRAMUSCULAR | Status: DC | PRN
  Administered 2017-01-24: 4 mg via INTRAVENOUS
  Filled 2017-01-24: qty 2

## 2017-01-24 MED ORDER — KETOROLAC TROMETHAMINE 15 MG/ML IJ SOLN
7.5000 mg | Freq: Four times a day (QID) | INTRAMUSCULAR | Status: AC
  Administered 2017-01-24 – 2017-01-25 (×4): 7.5 mg via INTRAVENOUS
  Filled 2017-01-24 (×4): qty 1

## 2017-01-24 MED ORDER — LOSARTAN POTASSIUM 50 MG PO TABS
100.0000 mg | ORAL_TABLET | Freq: Every day | ORAL | Status: DC
  Administered 2017-01-25 – 2017-01-26 (×2): 100 mg via ORAL
  Filled 2017-01-24 (×2): qty 2

## 2017-01-24 MED ORDER — LABETALOL HCL 5 MG/ML IV SOLN
INTRAVENOUS | Status: DC | PRN
  Administered 2017-01-24: 5 mg via INTRAVENOUS

## 2017-01-24 MED ORDER — MIDAZOLAM HCL 2 MG/2ML IJ SOLN
INTRAMUSCULAR | Status: AC
  Filled 2017-01-24: qty 2

## 2017-01-24 MED ORDER — METOCLOPRAMIDE HCL 5 MG PO TABS: 5.0000 mg | ORAL_TABLET | Freq: Three times a day (TID) | ORAL | Status: DC | PRN

## 2017-01-24 MED ORDER — ALUM & MAG HYDROXIDE-SIMETH 200-200-20 MG/5ML PO SUSP: 30.0000 mL | ORAL | Status: DC | PRN

## 2017-01-24 MED ORDER — ROCURONIUM BROMIDE 50 MG/5ML IV SOSY
PREFILLED_SYRINGE | INTRAVENOUS | Status: AC
  Filled 2017-01-24: qty 5

## 2017-01-24 MED ORDER — PHENOL 1.4 % MT LIQD
1.0000 | OROMUCOSAL | Status: DC | PRN
Start: 2017-01-24 — End: 2017-01-26

## 2017-01-24 MED ORDER — HYDROCODONE-ACETAMINOPHEN 5-325 MG PO TABS
1.0000 | ORAL_TABLET | ORAL | Status: DC | PRN
  Administered 2017-01-24 (×2): 2 via ORAL
  Administered 2017-01-25 – 2017-01-26 (×6): 1 via ORAL
  Filled 2017-01-24: qty 1
  Filled 2017-01-24: qty 2
  Filled 2017-01-24: qty 1
  Filled 2017-01-24: qty 2
  Filled 2017-01-24: qty 1
  Filled 2017-01-24 (×2): qty 2
  Filled 2017-01-24: qty 1

## 2017-01-24 MED ORDER — CEFAZOLIN SODIUM-DEXTROSE 2-4 GM/100ML-% IV SOLN
2.0000 g | Freq: Four times a day (QID) | INTRAVENOUS | Status: AC
  Administered 2017-01-24 (×2): 2 g via INTRAVENOUS
  Filled 2017-01-24 (×2): qty 100

## 2017-01-24 MED ORDER — MIDAZOLAM HCL 5 MG/5ML IJ SOLN
INTRAMUSCULAR | Status: DC | PRN
  Administered 2017-01-24: 1 mg via INTRAVENOUS

## 2017-01-24 MED ORDER — PROMETHAZINE HCL 25 MG/ML IJ SOLN
6.2500 mg | INTRAMUSCULAR | Status: DC | PRN
Start: 2017-01-24 — End: 2017-01-24

## 2017-01-24 MED ORDER — DULAGLUTIDE 0.75 MG/0.5ML ~~LOC~~ SOAJ: 0.7500 mg | SUBCUTANEOUS | Status: DC

## 2017-01-24 MED ORDER — INSULIN ASPART 100 UNIT/ML ~~LOC~~ SOLN
0.0000 [IU] | Freq: Three times a day (TID) | SUBCUTANEOUS | Status: DC
  Administered 2017-01-24 (×2): 3 [IU] via SUBCUTANEOUS
  Administered 2017-01-25: 8 [IU] via SUBCUTANEOUS
  Administered 2017-01-26: 2 [IU] via SUBCUTANEOUS

## 2017-01-24 MED ORDER — SODIUM CHLORIDE 0.9 % IR SOLN
Status: DC | PRN
  Administered 2017-01-24: 250 mL
  Administered 2017-01-24: 3000 mL

## 2017-01-24 MED ORDER — BUPIVACAINE-EPINEPHRINE (PF) 0.5% -1:200000 IJ SOLN
INTRAMUSCULAR | Status: DC | PRN
  Administered 2017-01-24: 30 mL via PERINEURAL

## 2017-01-24 MED ORDER — PHENYLEPHRINE 40 MCG/ML (10ML) SYRINGE FOR IV PUSH (FOR BLOOD PRESSURE SUPPORT)
PREFILLED_SYRINGE | INTRAVENOUS | Status: AC
  Filled 2017-01-24: qty 10

## 2017-01-24 MED ORDER — SUGAMMADEX SODIUM 200 MG/2ML IV SOLN
INTRAVENOUS | Status: AC
  Filled 2017-01-24: qty 2

## 2017-01-24 MED ORDER — GLIMEPIRIDE 4 MG PO TABS
4.0000 mg | ORAL_TABLET | Freq: Every day | ORAL | Status: DC
  Administered 2017-01-25 – 2017-01-26 (×2): 4 mg via ORAL
  Filled 2017-01-24 (×2): qty 1

## 2017-01-24 MED ORDER — 0.9 % SODIUM CHLORIDE (POUR BTL) OPTIME
TOPICAL | Status: DC | PRN
  Administered 2017-01-24: 1000 mL

## 2017-01-24 MED ORDER — METHOCARBAMOL 500 MG PO TABS
500.0000 mg | ORAL_TABLET | Freq: Four times a day (QID) | ORAL | Status: DC | PRN
  Administered 2017-01-24 – 2017-01-26 (×5): 500 mg via ORAL
  Filled 2017-01-24 (×5): qty 1

## 2017-01-24 MED ORDER — SODIUM CHLORIDE 0.9 % IV SOLN
INTRAVENOUS | Status: DC
  Administered 2017-01-24: 14:00:00 via INTRAVENOUS

## 2017-01-24 MED ORDER — SCOPOLAMINE 1 MG/3DAYS TD PT72
MEDICATED_PATCH | TRANSDERMAL | Status: DC
Start: 2017-01-24 — End: 2017-01-26
  Administered 2017-01-24: 1.5 mg via TRANSDERMAL
  Filled 2017-01-24: qty 1

## 2017-01-24 MED ORDER — EPINEPHRINE PF 1 MG/ML IJ SOLN
INTRAMUSCULAR | Status: AC
  Filled 2017-01-24: qty 1

## 2017-01-24 MED ORDER — POLYETHYLENE GLYCOL 3350 17 G PO PACK
17.0000 g | PACK | Freq: Every day | ORAL | Status: DC | PRN
  Administered 2017-01-26: 17 g via ORAL
  Filled 2017-01-24: qty 1

## 2017-01-24 MED ORDER — HYDROMORPHONE HCL 1 MG/ML IJ SOLN
0.5000 mg | INTRAMUSCULAR | Status: DC | PRN
  Administered 2017-01-24: 1 mg via INTRAVENOUS
  Filled 2017-01-24: qty 1

## 2017-01-24 MED ORDER — ROPIVACAINE HCL 5 MG/ML IJ SOLN
INTRAMUSCULAR | Status: DC | PRN
  Administered 2017-01-24: 30 mL via PERINEURAL

## 2017-01-24 MED ORDER — PROPOFOL 10 MG/ML IV BOLUS
INTRAVENOUS | Status: AC
  Filled 2017-01-24: qty 20

## 2017-01-24 MED ORDER — METOPROLOL SUCCINATE ER 100 MG PO TB24
100.0000 mg | ORAL_TABLET | Freq: Every day | ORAL | Status: DC
  Administered 2017-01-25 – 2017-01-26 (×2): 100 mg via ORAL
  Filled 2017-01-24 (×2): qty 1

## 2017-01-24 MED ORDER — AMLODIPINE BESYLATE 5 MG PO TABS
5.0000 mg | ORAL_TABLET | Freq: Two times a day (BID) | ORAL | Status: DC
  Administered 2017-01-24 – 2017-01-26 (×4): 5 mg via ORAL
  Filled 2017-01-24 (×4): qty 1

## 2017-01-24 MED ORDER — DEXAMETHASONE SODIUM PHOSPHATE 10 MG/ML IJ SOLN
10.0000 mg | Freq: Once | INTRAMUSCULAR | Status: AC
  Administered 2017-01-25: 10 mg via INTRAVENOUS
  Filled 2017-01-24: qty 1

## 2017-01-24 MED ORDER — SUGAMMADEX SODIUM 200 MG/2ML IV SOLN
INTRAVENOUS | Status: DC | PRN
  Administered 2017-01-24: 200 mg via INTRAVENOUS

## 2017-01-24 MED ORDER — LIDOCAINE 2% (20 MG/ML) 5 ML SYRINGE
INTRAMUSCULAR | Status: AC
  Filled 2017-01-24: qty 5

## 2017-01-24 MED ORDER — ONDANSETRON HCL 4 MG/2ML IJ SOLN
INTRAMUSCULAR | Status: AC
  Filled 2017-01-24: qty 2

## 2017-01-24 MED ORDER — ONDANSETRON HCL 4 MG/2ML IJ SOLN
INTRAMUSCULAR | Status: DC | PRN
  Administered 2017-01-24: 4 mg via INTRAVENOUS

## 2017-01-24 MED ORDER — FENTANYL CITRATE (PF) 250 MCG/5ML IJ SOLN
INTRAMUSCULAR | Status: AC
  Filled 2017-01-24: qty 5

## 2017-01-24 MED ORDER — POTASSIUM CHLORIDE CRYS ER 10 MEQ PO TBCR
10.0000 meq | EXTENDED_RELEASE_TABLET | Freq: Two times a day (BID) | ORAL | Status: DC
  Administered 2017-01-24 – 2017-01-26 (×5): 10 meq via ORAL
  Filled 2017-01-24 (×10): qty 1

## 2017-01-24 MED ORDER — ACETAMINOPHEN 650 MG RE SUPP: 650.0000 mg | Freq: Four times a day (QID) | RECTAL | Status: DC | PRN

## 2017-01-24 MED ORDER — PHENYLEPHRINE HCL 10 MG/ML IJ SOLN
INTRAMUSCULAR | Status: DC | PRN
  Administered 2017-01-24 (×2): 80 ug via INTRAVENOUS

## 2017-01-24 MED ORDER — LACTATED RINGERS IV SOLN
INTRAVENOUS | Status: DC | PRN
  Administered 2017-01-24 (×2): via INTRAVENOUS

## 2017-01-24 MED ORDER — DIPHENHYDRAMINE HCL 12.5 MG/5ML PO ELIX: 12.5000 mg | ORAL_SOLUTION | ORAL | Status: DC | PRN

## 2017-01-24 MED ORDER — HYDROMORPHONE HCL 1 MG/ML IJ SOLN
INTRAMUSCULAR | Status: AC
  Filled 2017-01-24: qty 1

## 2017-01-24 MED ORDER — PHENYLEPHRINE HCL 10 MG/ML IJ SOLN
INTRAMUSCULAR | Status: DC | PRN
  Administered 2017-01-24: 25 ug/min via INTRAVENOUS

## 2017-01-24 MED ORDER — CHOLECALCIFEROL 10 MCG (400 UNIT) PO TABS
400.0000 [IU] | ORAL_TABLET | Freq: Every day | ORAL | Status: DC
  Administered 2017-01-24 – 2017-01-26 (×3): 400 [IU] via ORAL
  Filled 2017-01-24 (×3): qty 1

## 2017-01-24 SURGICAL SUPPLY — 48 items
ADH SKN CLS APL DERMABOND .7 (GAUZE/BANDAGES/DRESSINGS) ×1
ALCOHOL ISOPROPYL (RUBBING) (MISCELLANEOUS) ×3 IMPLANT
BANDAGE ACE 6X5 VEL STRL LF (GAUZE/BANDAGES/DRESSINGS) ×3 IMPLANT
BANDAGE ELASTIC 6 VELCRO ST LF (GAUZE/BANDAGES/DRESSINGS) ×2 IMPLANT
BLADE SAW RECIP 87.9 MT (BLADE) ×3 IMPLANT
CAPT KNEE TRIATH TK-4 ×2 IMPLANT
CHLORAPREP W/TINT 26ML (MISCELLANEOUS) ×6 IMPLANT
CUFF TOURNIQUET SINGLE 34IN LL (TOURNIQUET CUFF) ×3 IMPLANT
DERMABOND ADVANCED (GAUZE/BANDAGES/DRESSINGS) ×2
DERMABOND ADVANCED .7 DNX12 (GAUZE/BANDAGES/DRESSINGS) ×1 IMPLANT
DRAIN HEMOVAC 7FR (DRAIN) ×1 IMPLANT
DRAPE SHEET LG 3/4 BI-LAMINATE (DRAPES) ×6 IMPLANT
DRAPE U-SHAPE 47X51 STRL (DRAPES) ×3 IMPLANT
DRAPE UNIVERSAL PACK (DRAPES) ×3 IMPLANT
DRSG AQUACEL AG ADV 3.5X10 (GAUZE/BANDAGES/DRESSINGS) ×2 IMPLANT
DRSG AQUACEL AG ADV 3.5X14 (GAUZE/BANDAGES/DRESSINGS) ×3 IMPLANT
DRSG TEGADERM 2-3/8X2-3/4 SM (GAUZE/BANDAGES/DRESSINGS) ×3 IMPLANT
ELECT BLADE 4.0 EZ CLEAN MEGAD (MISCELLANEOUS) ×3
ELECT REM PT RETURN 9FT ADLT (ELECTROSURGICAL) ×3
ELECTRODE BLDE 4.0 EZ CLN MEGD (MISCELLANEOUS) IMPLANT
ELECTRODE REM PT RTRN 9FT ADLT (ELECTROSURGICAL) ×1 IMPLANT
EVACUATOR 1/8 PVC DRAIN (DRAIN) IMPLANT
GLOVE BIO SURGEON STRL SZ8.5 (GLOVE) ×6 IMPLANT
GLOVE BIOGEL PI IND STRL 8.5 (GLOVE) ×1 IMPLANT
GLOVE BIOGEL PI INDICATOR 8.5 (GLOVE) ×2
GOWN STRL REUS W/TWL 2XL LVL3 (GOWN DISPOSABLE) ×5 IMPLANT
HANDPIECE INTERPULSE COAX TIP (DISPOSABLE)
KIT BASIN OR (CUSTOM PROCEDURE TRAY) ×3 IMPLANT
MANIFOLD NEPTUNE II (INSTRUMENTS) ×3 IMPLANT
NDL SPNL 18GX3.5 QUINCKE PK (NEEDLE) ×2 IMPLANT
NEEDLE SPNL 18GX3.5 QUINCKE PK (NEEDLE) ×6 IMPLANT
PACK TOTAL JOINT (CUSTOM PROCEDURE TRAY) ×3 IMPLANT
PACK TOTAL KNEE CUSTOM (KITS) ×3 IMPLANT
PADDING CAST COTTON 6X4 STRL (CAST SUPPLIES) ×3 IMPLANT
SAW OSC TIP CART 19.5X105X1.3 (SAW) ×3 IMPLANT
SEALER BIPOLAR AQUA 6.0 (INSTRUMENTS) ×3 IMPLANT
SET HNDPC FAN SPRY TIP SCT (DISPOSABLE) ×1 IMPLANT
SET PAD KNEE POSITIONER (MISCELLANEOUS) ×3 IMPLANT
SUT MNCRL AB 3-0 PS2 27 (SUTURE) ×3 IMPLANT
SUT MON AB 2-0 CT1 36 (SUTURE) ×6 IMPLANT
SUT VIC AB 1 CTX 27 (SUTURE) ×3 IMPLANT
SUT VIC AB 2-0 CT1 27 (SUTURE) ×3
SUT VIC AB 2-0 CT1 TAPERPNT 27 (SUTURE) ×1 IMPLANT
SUT VLOC 180 0 24IN GS25 (SUTURE) ×3 IMPLANT
SYR 50ML LL SCALE MARK (SYRINGE) ×6 IMPLANT
TOWER CARTRIDGE SMART MIX (DISPOSABLE) ×1 IMPLANT
TRAY CATH 16FR W/PLASTIC CATH (SET/KITS/TRAYS/PACK) IMPLANT
WRAP KNEE MAXI GEL POST OP (GAUZE/BANDAGES/DRESSINGS) ×3 IMPLANT

## 2017-01-24 NOTE — Evaluation (Addendum)
Physical Therapy Evaluation Patient Details Name: Carol Keith MRN: [id number] DOB: 1950/11/28 Today's Date: 01/24/2017   History of Present Illness  Pt is 66 y/o female s/p elective L TKA secondary to L knee OA. PMH inludes HTN, heart murmur, DM, and R TKA.   Clinical Impression  Pt is s/p elective surgery above with deficits below. PTA, pt was independent with all mobility tasks. Upon evaluation, pt presenting with post op pain and weakness, requiring min to min guard assist with mobility. Pt limited in activity tolerance this session secondary to nausea/vomiting. RN notified and states pt has already received medication for nausea. Per pt and family, pt will be receiving follow up therapy in the outpatient setting. Pt's husband available to assist as needed upon d/c home. Recommendations below. Will continue to follow and progress mobility per pt's tolerance to increase independence with functional mobility.     Follow Up Recommendations Outpatient PT;Supervision/Assistance - 24 hour    Equipment Recommendations  None recommended by PT    Recommendations for Other Services       Precautions / Restrictions Precautions Precautions: Knee Precaution Booklet Issued: Yes (comment) Precaution Comments: Reviewed supine ther ex with pt.  Restrictions Weight Bearing Restrictions: Yes LLE Weight Bearing: Weight bearing as tolerated      Mobility  Bed Mobility Overal bed mobility: Needs Assistance Bed Mobility: Supine to Sit     Supine to sit: Min guard;HOB elevated     General bed mobility comments: Min guard for steadying during transfer.   Transfers Overall transfer level: Needs assistance Equipment used: Rolling walker (2 wheeled) Transfers: Sit to/from Stand Sit to Stand: Min assist         General transfer comment: Min A for lift assist. Verbal cues for appropriate LE placement and for hand placement prior to transfer.   Ambulation/Gait Ambulation/Gait  assistance: Min guard Ambulation Distance (Feet): 25 Feet Assistive device: 4-wheeled walker Gait Pattern/deviations: Step-to pattern;Decreased step length - left;Decreased stance time - left;Antalgic Gait velocity: Decreased  Gait velocity interpretation: Below normal speed for age/gender General Gait Details: Slow, antalgic gait secondary to post op pain and weakness. Pt reporting wooziness during ambulation which limited gait tolerance. Verbal cues for sequencing with RW.   Stairs            Wheelchair Mobility    Modified Rankin (Stroke Patients Only)       Balance Overall balance assessment: Needs assistance Sitting-balance support: No upper extremity supported;Feet supported Sitting balance-Leahy Scale: Good     Standing balance support: Bilateral upper extremity supported;During functional activity Standing balance-Leahy Scale: Poor Standing balance comment: Reliant on RW for steadying                              Pertinent Vitals/Pain Pain Assessment: Faces Faces Pain Scale: Hurts little more Pain Location: L knee Pain Descriptors / Indicators: Sore;Operative site guarding Pain Intervention(s): Limited activity within patient's tolerance;Monitored during session;Repositioned    Home Living Family/patient expects to be discharged to:: Private residence Living Arrangements: Spouse/significant other Available Help at Discharge: Family;Available 24 hours/day Type of Home: House Home Access: Other (comment) (threshold step )     Home Layout: One level Home Equipment: Walker - 2 wheels;Cane - single point;Bedside commode      Prior Function Level of Independence: Independent               Hand Dominance   Dominant Hand: Right  Extremity/Trunk Assessment   Upper Extremity Assessment Upper Extremity Assessment: Defer to OT evaluation    Lower Extremity Assessment Lower Extremity Assessment: LLE deficits/detail LLE Deficits /  Details: Sensory system in tact. Deficits consistent with post op pain and weakness.     Cervical / Trunk Assessment Cervical / Trunk Assessment: Normal  Communication   Communication: No difficulties  Cognition Arousal/Alertness: Awake/alert Behavior During Therapy: WFL for tasks assessed/performed Overall Cognitive Status: Within Functional Limits for tasks assessed                                        General Comments General comments (skin integrity, edema, etc.): Pt with nausea/vomiting at end of session. Notified RN, however, RN reporting pt already had something for nausea.     Exercises Total Joint Exercises Ankle Circles/Pumps: AROM;Both;10 reps;Supine Quad Sets: AROM;Left;10 reps;Supine Towel Squeeze: AROM;Both;10 reps;Supine Hip ABduction/ADduction: AROM;Left;10 reps;Supine   Assessment/Plan    PT Assessment Patient needs continued PT services  PT Problem List Decreased strength;Decreased range of motion;Decreased balance;Decreased mobility;Decreased activity tolerance;Decreased knowledge of use of DME;Decreased knowledge of precautions;Pain       PT Treatment Interventions Gait training;DME instruction;Stair training;Functional mobility training;Therapeutic activities;Therapeutic exercise;Balance training;Neuromuscular re-education;Patient/family education    PT Goals (Current goals can be found in the Care Plan section)  Acute Rehab PT Goals Patient Stated Goal: to go home  PT Goal Formulation: With patient Time For Goal Achievement: 01/31/17 Potential to Achieve Goals: Good    Frequency 7X/week   Barriers to discharge        Co-evaluation               End of Session Equipment Utilized During Treatment: Gait belt Activity Tolerance: Treatment limited secondary to medical complications (Comment) (nausea/vomiting ) Patient left: in chair;with call bell/phone within reach;with family/visitor present Nurse Communication: Mobility  status;Other (comment) (nausea/vomiting episode) PT Visit Diagnosis: Other abnormalities of gait and mobility (R26.89);Pain Pain - Right/Left: Left Pain - part of body: Knee    Time: 0370-4888 PT Time Calculation (min) (ACUTE ONLY): 31 min   Charges:   PT Evaluation $PT Eval Low Complexity: 1 Procedure PT Treatments $Gait Training: 8-22 mins   PT G Codes:        Nicky Pugh, PT, DPT  Acute Rehabilitation Services  Pager: [phone]   Army Melia 01/24/2017, 4:24 PM

## 2017-01-24 NOTE — Interval H&P Note (Signed)
History and Physical Interval Note:  01/24/2017 7:31 AM  Carol Keith  has presented today for surgery, with the diagnosis of Degenerative joint disease left knee  The various methods of treatment have been discussed with the patient and family. After consideration of risks, benefits and other options for treatment, the patient has consented to  Procedure(s) with comments: LEFT TOTAL KNEE ARTHROPLASTY WITH COMPUTER NAVIGATION (Left) - Dr. requesting RNFA as a surgical intervention .  The patient's history has been reviewed, patient examined, no change in status, stable for surgery.  I have reviewed the patient's chart and labs.  Questions were answered to the patient's satisfaction.     Celisa Schoenberg, Horald Pollen

## 2017-01-24 NOTE — Anesthesia Procedure Notes (Signed)
Procedure Name: Intubation Date/Time: 01/24/2017 7:50 AM Performed by: Rebekah Chesterfield L Pre-anesthesia Checklist: Patient identified, Emergency Drugs available, Suction available and Patient being monitored Patient Re-evaluated:Patient Re-evaluated prior to inductionOxygen Delivery Method: Circle System Utilized Preoxygenation: Pre-oxygenation with 100% oxygen Intubation Type: IV induction Ventilation: Mask ventilation without difficulty Laryngoscope Size: Mac and 3 Grade View: Grade I Tube type: Oral Tube size: 7.5 mm Number of attempts: 1 Airway Equipment and Method: Stylet Placement Confirmation: ETT inserted through vocal cords under direct vision,  positive ETCO2 and breath sounds checked- equal and bilateral Secured at: 21 cm Tube secured with: Tape Dental Injury: Teeth and Oropharynx as per pre-operative assessment

## 2017-01-24 NOTE — Anesthesia Postprocedure Evaluation (Addendum)
Anesthesia Post Note  Patient: Carol Keith  Procedure(s) Performed: Procedure(s) (LRB): LEFT TOTAL KNEE ARTHROPLASTY WITH COMPUTER NAVIGATION (Left)  Patient location during evaluation: PACU Anesthesia Type: General Level of consciousness: sedated Pain management: pain level controlled Vital Signs Assessment: post-procedure vital signs reviewed and stable Respiratory status: spontaneous breathing and respiratory function stable Cardiovascular status: stable Anesthetic complications: no       Last Vitals:  Vitals:   01/24/17 1120 01/24/17 1152  BP: (!) 162/72 (!) 164/72  Pulse: 87 82  Resp: 20 16  Temp: 36.6 C 36.8 C    Last Pain:  Vitals:   01/24/17 1210  TempSrc:   PainSc: 6                  Granger Chui DANIEL

## 2017-01-24 NOTE — Op Note (Signed)
OPERATIVE REPORT  SURGEON: Rod Can, MD   ASSISTANT: Ky Barban, RNFA.  PREOPERATIVE DIAGNOSIS: Left knee arthritis.   POSTOPERATIVE DIAGNOSIS: Left knee arthritis.   PROCEDURE: Left total knee arthroplasty.   IMPLANTS: Stryker Triathlon CR femur, size 3. Stryker Tritanium tibia, size 4. X3 polyethelyene insert, size 9 mm, CR. 3 button asymmetric patella, size 35 mm.  ANESTHESIA:  General and Regional  TOURNIQUET TIME: Not utilized.   ESTIMATED BLOOD LOSS:-* No blood loss amount entered *    ANTIBIOTICS: 2 g Ancef.  DRAINS: None.  COMPLICATIONS: None   CONDITION: PACU - hemodynamically stable.   BRIEF CLINICAL NOTE: Carol Keith is a 66 y.o. female with a long-standing history of Left knee arthritis. After failing conservative management, the patient was indicated for total knee arthroplasty. The risks, benefits, and alternatives to the procedure were explained, and the patient elected to proceed.  PROCEDURE IN DETAIL: Adductor canal block was obtained in the pre-op holding area. Once inside the operative room, general anesthesia was obtained, and a foley catheter was inserted. The patient was then positioned, a nonsterile tourniquet was placed, and the lower extremity was prepped and draped in the normal sterile surgical fashion. A time-out was called verifying side and site of surgery. The patient received IV antibiotics within 60 minutes of beginning the procedure. The tourniquet was not utilized.  An anterior approach to the knee was performed utilizing a midvastus arthrotomy. A medial release was performed and the patellar fat pad was excised. Stryker navigation was used to cut the distal femur perpendicular to the mechanical axis. A freehand patellar resection was performed, and the patella was sized an prepared with 3 lug holes.  Nagivation was used to make  a neutral proximal tibia resection, taking 9 mm of bone from the less affected lateral side with 3 degrees of slope. The menisci were excised. A spacer block was placed, and the alignment and balance in extension were confirmed.   The distal femur was sized using the 3-degree external rotation guide referencing the posterior femoral cortex. The appropriate 4-in-1 cutting block was pinned into place. Rotation was checked using Whiteside's line, the epicondylar axis, and then confirmed with a spacer block in flexion. The remaining femoral cuts were performed, taking care to protect the MCL.  The tibia was sized and the trial tray was pinned into place. The remaining trail components were inserted. The knee was stable to varus and valgus stress through a full range of motion. The patella tracked centrally, and the PCL was well balanced. The trial components were removed, and the proximal tibial surface was prepared. Final components were impacted into place. The knee was tested for a final time and found to be well balanced.  The wound was copiously irrigated with a dilute betadine solution followed by normal saline with pulse lavage. Marcaine solution was injected into the periarticular soft tissue. The wound was closed in layers using #1 Vicryl and Stratafix for the fascia, 2-0 Vicryl for the subcutaneous fat, 2-0 Monocryl for the deep dermal layer, 3-0 running Monocryl subcuticular Stitch, and Dermabond for the skin. Once the glue was fully dried, an Aquacell Ag and compressive dressing were applied. Tthe patient was transported to the recovery room in stable condition. Sponge, needle, and instrument counts were correct at the end of the case x2. The patient tolerated the procedure well and there were no known complications.

## 2017-01-24 NOTE — Transfer of Care (Signed)
Immediate Anesthesia Transfer of Care Note  Patient: Carol Keith  Procedure(s) Performed: Procedure(s) with comments: LEFT TOTAL KNEE ARTHROPLASTY WITH COMPUTER NAVIGATION (Left) - Dr. requesting RNFA  Patient Location: PACU  Anesthesia Type:General and GA combined with regional for post-op pain  Level of Consciousness: awake, alert , oriented and patient cooperative  Airway & Oxygen Therapy: Patient Spontanous Breathing and Patient connected to nasal cannula oxygen  Post-op Assessment: Report given to RN and Post -op Vital signs reviewed and stable  Post vital signs: Reviewed and stable  Last Vitals:  Vitals:   01/24/17 0600 01/24/17 1019  BP: (!) 170/73 (!) 161/77  Pulse: 96 81  Resp: 16 16  Temp: 36.6 C     Last Pain:  Vitals:   01/24/17 0600  TempSrc: Oral         Complications: No apparent anesthesia complications

## 2017-01-24 NOTE — Anesthesia Procedure Notes (Signed)
Anesthesia Regional Block: Adductor canal block   Pre-Anesthetic Checklist: ,, timeout performed, Correct Patient, Correct Site, Correct Laterality, Correct Procedure, Correct Position, site marked, Risks and benefits discussed,  Surgical consent,  Pre-op evaluation,  At surgeon's request and post-op pain management  Laterality: Left  Prep: chloraprep       Needles:  Injection technique: Single-shot  Needle Type: Stimulator Needle - 80     Needle Length: 10cm  Needle Gauge: 21     Additional Needles:   Procedures: ultrasound guided,,,,,,,,  Narrative:  Start time: 01/24/2017 7:11 AM End time: 01/24/2017 7:21 AM Injection made incrementally with aspirations every 5 mL.  Performed by: Personally

## 2017-01-24 NOTE — Discharge Instructions (Signed)
° °Dr. Laylana Gerwig °Total Joint Specialist °[facility] °[address] °[place], [place] [address] °[phone] ° °TOTAL KNEE REPLACEMENT POSTOPERATIVE DIRECTIONS ° ° ° °Knee Rehabilitation, Guidelines Following Surgery  °Results after knee surgery are often greatly improved when you follow the exercise, range of motion and muscle strengthening exercises prescribed by your doctor. Safety measures are also important to protect the knee from further injury. Any time any of these exercises cause you to have increased pain or swelling in your knee joint, decrease the amount until you are comfortable again and slowly increase them. If you have problems or questions, call your caregiver or physical therapist for advice.  ° °WEIGHT BEARING °Weight bearing as tolerated with assist device (walker, cane, etc) as directed, use it as long as suggested by your surgeon or therapist, typically at least 4-6 weeks. ° °HOME CARE INSTRUCTIONS  °Remove items at home which could result in a fall. This includes throw rugs or furniture in walking pathways.  °Continue medications as instructed at time of discharge. °You may have some home medications which will be placed on hold until you complete the course of blood thinner medication.  °You may start showering once you are discharged home but do not submerge the incision under water. Just pat the incision dry and apply a dry gauze dressing on daily. °Walk with walker as instructed.  °You may resume a sexual relationship in one month or when given the OK by your doctor.  °· Use walker as long as suggested by your caregivers. °· Avoid periods of inactivity such as sitting longer than an hour when not asleep. This helps prevent blood clots.  °You may put full weight on your legs and walk as much as is comfortable.  °You may return to work once you are cleared by your doctor.  °Do not drive a car for 6 weeks or until released by you surgeon.  °· Do not drive  while taking narcotics.  °Wear the elastic stockings for three weeks following surgery during the day but you may remove then at night. °Make sure you keep all of your appointments after your operation with all of your doctors and caregivers. You should call the office at the above phone number and make an appointment for approximately two weeks after the date of your surgery. °Do not remove your surgical dressing. The dressing is waterproof; you may take showers in 3 days, but do not take tub baths or submerge the dressing. °Please pick up a stool softener and laxative for home use as long as you are requiring pain medications. °· ICE to the affected knee every three hours for 30 minutes at a time and then as needed for pain and swelling.  Continue to use ice on the knee for pain and swelling from surgery. You may notice swelling that will progress down to the foot and ankle.  This is normal after surgery.  Elevate the leg when you are not up walking on it.   °It is important for you to complete the blood thinner medication as prescribed by your doctor. °· Continue to use the breathing machine which will help keep your temperature down.  It is common for your temperature to cycle up and down following surgery, especially at night when you are not up moving around and exerting yourself.  The breathing machine keeps your lungs expanded and your temperature down. ° °RANGE OF MOTION AND STRENGTHENING EXERCISES  °Rehabilitation of the knee is important following   a knee injury or an operation. After just a few days of immobilization, the muscles of the thigh which control the knee become weakened and shrink (atrophy). Knee exercises are designed to build up the tone and strength of the thigh muscles and to improve knee motion. Often times heat used for twenty to thirty minutes before working out will loosen up your tissues and help with improving the range of motion but do not use heat for the first two weeks following  surgery. These exercises can be done on a training (exercise) mat, on the floor, on a table or on a bed. Use what ever works the best and is most comfortable for you Knee exercises include:  °Leg Lifts - While your knee is still immobilized in a splint or cast, you can do straight leg raises. Lift the leg to 60 degrees, hold for 3 sec, and slowly lower the leg. Repeat 10-20 times 2-3 times daily. Perform this exercise against resistance later as your knee gets better.  °Quad and Hamstring Sets - Tighten up the muscle on the front of the thigh (Quad) and hold for 5-10 sec. Repeat this 10-20 times hourly. Hamstring sets are done by pushing the foot backward against an object and holding for 5-10 sec. Repeat as with quad sets.  °A rehabilitation program following serious knee injuries can speed recovery and prevent re-injury in the future due to weakened muscles. Contact your doctor or a physical therapist for more information on knee rehabilitation.  ° °SKILLED REHAB INSTRUCTIONS: °If the patient is transferred to a skilled rehab facility following release from the hospital, a list of the current medications will be sent to the facility for the patient to continue.  When discharged from the skilled rehab facility, please have the facility set up the patient's Home Health Physical Therapy prior to being released. Also, the skilled facility will be responsible for providing the patient with their medications at time of release from the facility to include their pain medication, the muscle relaxants, and their blood thinner medication. If the patient is still at the rehab facility at time of the two week follow up appointment, the skilled rehab facility will also need to assist the patient in arranging follow up appointment in our office and any transportation needs. ° °MAKE SURE YOU:  °Understand these instructions.  °Will watch your condition.  °Will get help right away if you are not doing well or get worse.   ° ° °Pick up stool softner and laxative for home use following surgery while on pain medications. °Do NOT remove your dressing. You may shower.  °Do not take tub baths or submerge incision under water. °May shower starting three days after surgery. °Please use a clean towel to pat the incision dry following showers. °Continue to use ice for pain and swelling after surgery. °Do not use any lotions or creams on the incision until instructed by your surgeon. ° °

## 2017-01-24 NOTE — Progress Notes (Signed)
PHARMACIST - PHYSICIAN ORDER COMMUNICATION  CONCERNING: P&T Medication Policy on Herbal Medications  DESCRIPTION:  This patient's order for:  Biotin  has been noted.  This product(s) is classified as an "herbal" or natural product. Due to a lack of definitive safety studies or FDA approval, nonstandard manufacturing practices, plus the potential risk of unknown drug-drug interactions while on inpatient medications, the Pharmacy and Therapeutics Committee does not permit the use of "herbal" or natural products of this type within [facility].   ACTION TAKEN: The pharmacy department is unable to verify this order at this time. Please reevaluate patient's clinical condition at discharge and address if the herbal or natural product(s) should be resumed at that time.   Arty Baumgartner, [place] Pager: [phone] 01/24/2017 12:05 PM

## 2017-01-24 NOTE — H&P (View-Only) (Signed)
TOTAL KNEE ADMISSION H&P  Patient is being admitted for left total knee arthroplasty.  Subjective:  Chief Complaint:left knee pain.  HPI: Carol Keith, 66 y.o. female, has a history of pain and functional disability in the left knee due to arthritis and has failed non-surgical conservative treatments for greater than 12 weeks to includeNSAID's and/or analgesics, corticosteriod injections, viscosupplementation injections, flexibility and strengthening excercises, supervised PT with diminished ADL's post treatment, use of assistive devices, weight reduction as appropriate and activity modification.  Onset of symptoms was gradual, starting 3 years ago with gradually worsening course since that time. The patient noted no past surgery on the left knee(s).  Patient currently rates pain in the left knee(s) at 10 out of 10 with activity. Patient has night pain, worsening of pain with activity and weight bearing, pain that interferes with activities of daily living, pain with passive range of motion, crepitus and joint swelling.  Patient has evidence of subchondral cysts, subchondral sclerosis, periarticular osteophytes and joint space narrowing by imaging studies. There is no active infection.  Patient Active Problem List   Diagnosis Date Noted  . Primary osteoarthritis of right knee 05/17/2016   Past Medical History:  Diagnosis Date  . Arthritis    kneees, back   . Cancer [facility])    breast cancer  . Chronic kidney disease    appt. schedule for Dr. Lynnette Caffey, [id number]  . Complication of anesthesia   . Diabetes mellitus without complication ([place])    diagnosed x10 yrs. ago  . Heart murmur   . Hypertension   . PONV (postoperative nausea and vomiting)     Past Surgical History:  Procedure Laterality Date  . ABDOMINAL HYSTERECTOMY    . APPENDECTOMY    . BREAST SURGERY Right    for Breast Ca, tx with surg. & chemo & radiation    . CHOLECYSTECTOMY    . KNEE ARTHROPLASTY Right 05/17/2016   Procedure: RIGHT TOTAL KNEE ARTHROPLASTY WITH COMPUTER NAVIGATION;  Surgeon: Rod Can, MD;  Location: [place];  Service: Orthopedics;  Laterality: Right;  Needs RNFA  . LAPAROSCOPY  1999   done at [facility], "something with separating my organs because things had grown together"  . TONSILLECTOMY    . TUBAL LIGATION       (Not in a hospital admission) Allergies  Allergen Reactions  . No Known Allergies     Social History  Substance Use Topics  . Smoking status: Never Smoker  . Smokeless tobacco: Never Used  . Alcohol use No    No family history on file.   Review of Systems  Constitutional: Negative.   HENT: Positive for tinnitus.   Eyes: Positive for blurred vision.  Respiratory: Negative.   Cardiovascular: Negative.   Gastrointestinal: Positive for constipation.  Genitourinary: Positive for urgency.  Musculoskeletal: Positive for back pain and joint pain.  Skin: Negative.   Neurological: Positive for dizziness.  Endo/Heme/Allergies: Negative.   Psychiatric/Behavioral: Negative.     Objective:  Physical Exam  Vitals reviewed. Constitutional: She is oriented to person, place, and time. She appears well-developed and well-nourished.  HENT:  Head: Normocephalic and atraumatic.  Eyes: Conjunctivae and EOM are normal. Pupils are equal, round, and reactive to light.  Neck: Normal range of motion. Neck supple. No thyromegaly present.  Cardiovascular: Normal rate, regular rhythm and intact distal pulses.   Respiratory: Effort normal. No respiratory distress.  GI: Soft. She exhibits no distension.  Genitourinary:  Genitourinary Comments: deferred  Musculoskeletal:  Left knee: She exhibits decreased range of motion and abnormal alignment. Tenderness found. Medial joint line and lateral joint line tenderness noted.  Neurological: She is alert and oriented to person, place, and time. She has normal reflexes.  Skin: Skin is warm and dry.  Psychiatric: She has a normal  mood and affect. Her behavior is normal. Judgment and thought content normal.    Vital signs in last 24 hours: @VSRANGES @  Labs:   Estimated body mass index is 32.42 kg/m as calculated from the following:   Height as of 05/06/16: 5\' 3"  (1.6 m).   Weight as of 05/17/16: 83 kg (183 lb).   Imaging Review Plain radiographs demonstrate severe degenerative joint disease of the left knee(s). The overall alignment issignificant varus. The bone quality appears to be adequate for age and reported activity level.  Assessment/Plan:  End stage arthritis, left knee   The patient history, physical examination, clinical judgment of the provider and imaging studies are consistent with end stage degenerative joint disease of the left knee(s) and total knee arthroplasty is deemed medically necessary. The treatment options including medical management, injection therapy arthroscopy and arthroplasty were discussed at length. The risks and benefits of total knee arthroplasty were presented and reviewed. The risks due to aseptic loosening, infection, stiffness, patella tracking problems, thromboembolic complications and other imponderables were discussed. The patient acknowledged the explanation, agreed to proceed with the plan and consent was signed. Patient is being admitted for inpatient treatment for surgery, pain control, PT, OT, prophylactic antibiotics, VTE prophylaxis, progressive ambulation and ADL's and discharge planning. The patient is planning to be discharged home with outpatient PT

## 2017-01-25 ENCOUNTER — Encounter (HOSPITAL_COMMUNITY): Payer: Self-pay | Admitting: Orthopedic Surgery

## 2017-01-25 LAB — BASIC METABOLIC PANEL
ANION GAP: 8 (ref 5–15)
BUN: 15 mg/dL (ref 6–20)
CALCIUM: 8.6 mg/dL — AB (ref 8.9–10.3)
CO2: 24 mmol/L (ref 22–32)
Chloride: 109 mmol/L (ref 101–111)
Creatinine, Ser: 1.07 mg/dL — ABNORMAL HIGH (ref 0.44–1.00)
GFR calc Af Amer: >60 mL/min (ref >60)
GFR, EST NON AFRICAN AMERICAN: 52 mL/min — AB (ref >60)
GLUCOSE: 87 mg/dL (ref 65–99)
Potassium: 2.8 mmol/L — ABNORMAL LOW (ref 3.5–5.1)
Sodium: 141 mmol/L (ref 135–145)

## 2017-01-25 LAB — GLUCOSE, CAPILLARY
GLUCOSE-CAPILLARY: 84 mg/dL (ref 65–99)
GLUCOSE-CAPILLARY: 168 mg/dL — AB (ref 65–99)
Glucose-Capillary: 160 mg/dL — ABNORMAL HIGH (ref 65–99)
Glucose-Capillary: 265 mg/dL — ABNORMAL HIGH (ref 65–99)
Glucose-Capillary: 91 mg/dL (ref 65–99)

## 2017-01-25 LAB — CBC
HEMATOCRIT: 26.5 % — AB (ref 36.0–46.0)
Hemoglobin: 9 g/dL — ABNORMAL LOW (ref 12.0–15.0)
MCH: 29.1 pg (ref 26.0–34.0)
MCHC: 34 g/dL (ref 30.0–36.0)
MCV: 85.8 fL (ref 78.0–100.0)
PLATELETS: 171 10*3/uL (ref 150–400)
RBC: 3.09 MIL/uL — ABNORMAL LOW (ref 3.87–5.11)
RDW: 13.9 % (ref 11.5–15.5)
WBC: 8.1 10*3/uL (ref 4.0–10.5)

## 2017-01-25 MED ORDER — ASPIRIN 81 MG PO CHEW: 81.0000 mg | CHEWABLE_TABLET | Freq: Two times a day (BID) | ORAL | 1 refills | Status: DC

## 2017-01-25 MED ORDER — SENNA 8.6 MG PO TABS: 2.0000 | ORAL_TABLET | Freq: Every day | ORAL | 0 refills | Status: DC

## 2017-01-25 MED ORDER — HYDROCODONE-ACETAMINOPHEN 5-325 MG PO TABS: 1.0000 | ORAL_TABLET | ORAL | 0 refills | Status: DC | PRN

## 2017-01-25 MED ORDER — ONDANSETRON HCL 4 MG PO TABS: 4.0000 mg | ORAL_TABLET | Freq: Four times a day (QID) | ORAL | 0 refills | Status: DC | PRN

## 2017-01-25 NOTE — Progress Notes (Signed)
Physical Therapy Treatment Patient Details Name: Carol Keith MRN: [id number] DOB: 19-Jul-1951 Today's Date: 01/25/2017    History of Present Illness Pt is 66 y/o female s/p elective L TKA secondary to L knee OA. PMH inludes HTN, heart murmur, DM, and R TKA.     PT Comments    Patient demonstrated improving gait mechanics and good tolerance to increase gait distance. Continue to progress as tolerated with anticipated d/c home with HHPT.    Follow Up Recommendations  Outpatient PT;Supervision/Assistance - 24 hour     Equipment Recommendations  None recommended by PT    Recommendations for Other Services       Precautions / Restrictions Precautions Precautions: Knee Precaution Comments: reviewed precautions/positioning with pt Restrictions Weight Bearing Restrictions: Yes LLE Weight Bearing: Weight bearing as tolerated    Mobility  Bed Mobility Overal bed mobility: Modified Independent Bed Mobility: Sit to Supine           General bed mobility comments: increased time/effort; no physical assist needed; no use of rails; HOB flat  Transfers Overall transfer level: Needs assistance Equipment used: Rolling walker (2 wheeled) Transfers: Sit to/from Stand Sit to Stand: Supervision         General transfer comment: supervision for safety; demo of safe hand placement  Ambulation/Gait Ambulation/Gait assistance: Supervision Ambulation Distance (Feet): 120 Feet Assistive device: Rolling walker (2 wheeled) Gait Pattern/deviations: Decreased stance time - left;Step-through pattern;Decreased step length - right;Step-to pattern;Decreased weight shift to left Gait velocity: Decreased    General Gait Details: cues for posture and bilat heel strike; worked on step length symmetry and pt with improved step length pattern as distance increased   Stairs Stairs: Yes   Stair Management: No rails;Forwards;With walker;Step to pattern Number of Stairs: 1 General stair  comments: cues for sequencing and technique; assist to stabilize RW  Wheelchair Mobility    Modified Rankin (Stroke Patients Only)       Balance Overall balance assessment: Needs assistance Sitting-balance support: No upper extremity supported;Feet supported Sitting balance-Leahy Scale: Good     Standing balance support: Bilateral upper extremity supported;During functional activity Standing balance-Leahy Scale: Fair Standing balance comment: able to static stand and wash hands at sink without UE support                            Cognition Arousal/Alertness: Awake/alert Behavior During Therapy: WFL for tasks assessed/performed Overall Cognitive Status: Within Functional Limits for tasks assessed                                        Exercises Total Joint Exercises Quad Sets: AROM;Left;10 reps Short Arc Quad: AROM;Left;10 reps Heel Slides: AROM;Left;10 reps Straight Leg Raises: AROM;Left;10 reps Goniometric ROM: 5-90    General Comments        Pertinent Vitals/Pain Pain Assessment: 0-10 Pain Score: 6  Faces Pain Scale: Hurts little more Pain Location: L knee with weightbearing Pain Descriptors / Indicators: Sore;Aching Pain Intervention(s): Limited activity within patient's tolerance;Monitored during session;Repositioned    Home Living                      Prior Function            PT Goals (current goals can now be found in the care plan section) Acute Rehab PT Goals Patient Stated Goal: to go  home  PT Goal Formulation: With patient Time For Goal Achievement: 01/31/17 Potential to Achieve Goals: Good Progress towards PT goals: Progressing toward goals    Frequency    7X/week      PT Plan Current plan remains appropriate    Co-evaluation             End of Session Equipment Utilized During Treatment: Gait belt Activity Tolerance: Patient tolerated treatment well Patient left: with call bell/phone  within reach;in bed Nurse Communication: Mobility status PT Visit Diagnosis: Other abnormalities of gait and mobility (R26.89);Pain Pain - Right/Left: Left Pain - part of body: Knee     Time: 9169-4503 PT Time Calculation (min) (ACUTE ONLY): 29 min  Charges:  $Gait Training: 8-22 mins $Therapeutic Exercise: 8-22 mins $Therapeutic Activity: 8-22 mins                    G Codes:       Earney Navy, PTA Pager: [phone]     Darliss Cheney 01/25/2017, 2:50 PM

## 2017-01-25 NOTE — Evaluation (Signed)
Occupational Therapy Evaluation Patient Details Name: Carol Keith MRN: [id number] DOB: [birth year] Today's Date: 01/25/2017    History of Present Illness Pt is 66 y/o female s/p elective L TKA secondary to L knee OA. PMH inludes HTN, heart murmur, DM, and R TKA.    Clinical Impression   Pt admitted as above currently demonstrating deficits in her ability to perform ADL and self care tasks following L TKA and c/o pain. She is currently Min A for functional mobility, transfers and LB ADL's. She requires increased time for tasks and should benefit from additional acute OT visit to address tub/shower transfer using 3:1 and LB ADL techniques to assist with maximizing independence prior to d/c home with PRN family assist..    Follow Up Recommendations  No OT follow up;Supervision - Intermittent    Equipment Recommendations  None recommended by OT;Other (comment) (Per pt report, she has DME from previous surgery)    Recommendations for Other Services       Precautions / Restrictions Precautions Precautions: Knee Restrictions Weight Bearing Restrictions: Yes LLE Weight Bearing: Weight bearing as tolerated      Mobility Bed Mobility Overal bed mobility: Needs Assistance Bed Mobility: Supine to Sit     Supine to sit: Supervision;HOB elevated     General bed mobility comments: Increased time, no hands on/physical assist  Transfers Overall transfer level: Needs assistance Equipment used: Rolling walker (2 wheeled) Transfers: Sit to/from [facility] Sit to Stand: Min guard Stand pivot transfers: Min assist       General transfer comment: Min guard assist overall, increased time. Verbal cues for safety and sequencing w/ RW & appropriate LE placement and for hand placement prior to transfers.     Balance Overall balance assessment: Needs assistance Sitting-balance support: No upper extremity supported;Feet supported Sitting balance-Leahy Scale: Good      Standing balance support: Single extremity supported;During functional activity Standing balance-Leahy Scale: Poor Standing balance comment: Holds onto countertop when standing at sink during grooming tasks, leans forward requiring Min guard assist.                           ADL either performed or assessed with clinical judgement   ADL Overall ADL's : Needs assistance/impaired Eating/Feeding: Independent;Sitting   Grooming: Wash/dry hands;Wash/dry face;Oral care;Standing;Min guard Grooming Details (indicate cue type and reason): Standing at sink for grooming tasks. VC's for safety w/ RW. Pt tends to lean forward Upper Body Bathing: Sitting;Modified independent   Lower Body Bathing: Min guard;Sit to/from stand   Upper Body Dressing : Set up;Sitting   Lower Body Dressing: Min guard;Sit to/from stand   Toilet Transfer: Min guard;Ambulation;RW;BSC Toilet Transfer Details (indicate cue type and reason): Pt used 3:1 in bathroom today, takes increased time. Ambulates slowly - this may be medication related Toileting- Clothing Manipulation and Hygiene: Min guard;Sit to/from stand       Functional mobility during ADLs: Min guard;Rolling walker;Cueing for sequencing;Cueing for safety General ADL Comments: Pt was evaluated and educated in role of OT. She participated in ADL retraining session to consist of grooming in standing, toileting and functional mobility in room using RW and recliner transfer. Pt was educated verbally in tub/shower transfers and LB ADL's but pt appears sleepy and this should be reviewed/performed next treatment session.     Vision Baseline Vision/History: Wears glasses Wears Glasses: At all times Patient Visual Report: No change from baseline       Perception  Praxis      Pertinent Vitals/Pain Pain Assessment: 0-10 Pain Score: 2  Faces Pain Scale: Hurts a little bit Pain Location: L knee Pain Descriptors / Indicators: Sore;Operative site  guarding Pain Intervention(s): Ice applied     Hand Dominance Right   Extremity/Trunk Assessment Upper Extremity Assessment Upper Extremity Assessment: Overall WFL for tasks assessed   Lower Extremity Assessment Lower Extremity Assessment: Defer to PT evaluation   Cervical / Trunk Assessment Cervical / Trunk Assessment: Normal   Communication Communication Communication: No difficulties   Cognition Arousal/Alertness: Awake/alert Behavior During Therapy: WFL for tasks assessed/performed Overall Cognitive Status: Within Functional Limits for tasks assessed                                     General Comments       Exercises     Shoulder Instructions      Home Living Family/patient expects to be discharged to:: Private residence Living Arrangements: Spouse/significant other Available Help at Discharge: Family;Available 24 hours/day Type of Home: House Home Access: Other (comment) ([place])     Home Layout: One level     Bathroom Shower/Tub: Tub/shower unit   Bathroom Toilet: Handicapped height     Home Equipment: Environmental consultant - 2 wheels;Cane - single point;Bedside commode          Prior Functioning/Environment Level of Independence: Independent                 OT Problem List: Pain;Decreased strength;Decreased activity tolerance;Decreased knowledge of use of DME or AE      OT Treatment/Interventions: Self-care/ADL training;DME and/or AE instruction;Therapeutic activities;Patient/family education (Balance training during ADL's)    OT Goals(Current goals can be found in the care plan section) Acute Rehab OT Goals Patient Stated Goal: Go home with PRN family assist Time For Goal Achievement: 02/08/17 Potential to Achieve Goals: Good  OT Frequency: Min 2X/week   Barriers to D/C:            Co-evaluation              End of Session Equipment Utilized During Treatment: Gait belt;Rolling walker  Activity Tolerance: Patient  tolerated treatment well (Decreased pain during treatment session as pt pain medication took effect) Patient left: in chair;with call bell/phone within reach  OT Visit Diagnosis: Other abnormalities of gait and mobility (R26.89);Muscle weakness (generalized) (M62.81);Pain Pain - Right/Left: Left Pain - part of body: Knee                Time: 6147-0929 OT Time Calculation (min): 39 min Charges:  OT General Charges $OT Visit: 1 Procedure OT Evaluation $OT Eval Low Complexity: 1 Procedure OT Treatments $Self Care/Home Management : 23-37 mins G-Codes:     Amy Barnhill, OTR/L 01/25/17 10:33 AM    Barnhill, Amy Beth Dixon 01/25/2017, 10:29 AM

## 2017-01-25 NOTE — Progress Notes (Signed)
Physical Therapy Treatment Patient Details Name: Carol Keith MRN: [id number] DOB: 1951-05-30 Today's Date: 01/25/2017    History of Present Illness Pt is 66 y/o female s/p elective L TKA secondary to L knee OA. PMH inludes HTN, heart murmur, DM, and R TKA.     PT Comments    Patient is progressing toward mobility goals. Work on gait next session. Current plan remains appropriate.    Follow Up Recommendations  Outpatient PT;Supervision/Assistance - 24 hour     Equipment Recommendations  None recommended by PT    Recommendations for Other Services       Precautions / Restrictions Precautions Precautions: Knee Precaution Comments: reviewed precautions/positioning with pt Restrictions Weight Bearing Restrictions: Yes LLE Weight Bearing: Weight bearing as tolerated    Mobility  Bed Mobility Overal bed mobility: Needs Assistance Bed Mobility: Supine to Sit     Supine to sit: Supervision;HOB elevated     General bed mobility comments: pt OOB in chair upon arrival  Transfers Overall transfer level: Needs assistance Equipment used: Rolling walker (2 wheeled) Transfers: Sit to/from Stand Sit to Stand: Min guard;Supervision Stand pivot transfers: Min assist       General transfer comment: min guard first trial from recliner and supervision from [facility]; safe hand placement demonstrated  Ambulation/Gait Ambulation/Gait assistance: Min guard Ambulation Distance (Feet): 20 Feet Assistive device: Rolling walker (2 wheeled) Gait Pattern/deviations: Decreased stance time - left;Step-through pattern;Decreased step length - right Gait velocity: Decreased    General Gait Details: slow, steady gait; improved weightbearing; cues for sequencing and posture   Stairs            Wheelchair Mobility    Modified Rankin (Stroke Patients Only)       Balance Overall balance assessment: Needs assistance Sitting-balance support: No upper extremity supported;Feet  supported Sitting balance-Leahy Scale: Good     Standing balance support: Bilateral upper extremity supported;During functional activity Standing balance-Leahy Scale: Poor Standing balance comment: Reliant on RW for steadying                             Cognition Arousal/Alertness: Awake/alert Behavior During Therapy: WFL for tasks assessed/performed Overall Cognitive Status: Within Functional Limits for tasks assessed                                        Exercises Total Joint Exercises Quad Sets: AROM;Left;10 reps Short Arc Quad: AROM;Left;10 reps Heel Slides: AROM;Left;10 reps Straight Leg Raises: AROM;Left;10 reps Goniometric ROM: 5-90    General Comments        Pertinent Vitals/Pain Pain Assessment: Faces Pain Score: 2  Faces Pain Scale: Hurts little more Pain Location: L knee Pain Descriptors / Indicators: Sore;Operative site guarding Pain Intervention(s): Limited activity within patient's tolerance;Monitored during session;Premedicated before session;Repositioned    Home Living Family/patient expects to be discharged to:: Private residence Living Arrangements: Spouse/significant other Available Help at Discharge: Family;Available 24 hours/day Type of Home: House Home Access: Other (comment) ([facility])   Home Layout: One level Home Equipment: Walker - 2 wheels;Cane - single point;Bedside commode      Prior Function Level of Independence: Independent          PT Goals (current goals can now be found in the care plan section) Acute Rehab PT Goals Patient Stated Goal: to go home  PT Goal Formulation: With patient Time  For Goal Achievement: 01/31/17 Potential to Achieve Goals: Good Progress towards PT goals: Progressing toward goals    Frequency    7X/week      PT Plan Current plan remains appropriate    Co-evaluation             End of Session Equipment Utilized During Treatment: Gait belt Activity  Tolerance: Patient tolerated treatment well Patient left: in chair;with call bell/phone within reach;with nursing/sitter in room Nurse Communication: Mobility status PT Visit Diagnosis: Other abnormalities of gait and mobility (R26.89);Pain Pain - Right/Left: Left Pain - part of body: Knee     Time: 1638-4536 PT Time Calculation (min) (ACUTE ONLY): 27 min  Charges:  $Gait Training: 8-22 mins $Therapeutic Exercise: 8-22 mins                    G Codes:       Earney Navy, PTA Pager: [phone]     Darliss Cheney 01/25/2017, 1:19 PM

## 2017-01-25 NOTE — Discharge Summary (Signed)
Physician Discharge Summary  Patient ID: NETTIE WYFFELS MRN: [id number] DOB/AGE: [birth year] 66 y.o.  Admit date: 01/24/2017 Discharge date: 01/26/2017  Admission Diagnoses:  Osteoarthritis of left knee  Discharge Diagnoses:  Principal Problem:   Osteoarthritis of left knee   Past Medical History:  Diagnosis Date  . Arthritis    kneees, back   . Cancer [facility])    breast cancer  . Chronic kidney disease    appt. schedule for Dr. Lynnette Caffey, [id number]  . Complication of anesthesia   . Diabetes mellitus without complication ([place])    diagnosed x10 yrs. ago  . Heart murmur   . Hypertension   . PONV (postoperative nausea and vomiting)    when had hysterectomy yrs ago    Surgeries: Procedure(s): LEFT TOTAL KNEE ARTHROPLASTY WITH COMPUTER NAVIGATION on 01/24/2017   Consultants (if any):   Discharged Condition: Improved  Hospital Course: ALARIA OCONNOR is an 66 y.o. female who was admitted 01/24/2017 with a diagnosis of Osteoarthritis of left knee and went to the operating room on 01/24/2017 and underwent the above named procedures.    She was given perioperative antibiotics:  Anti-infectives    Start     Dose/Rate Route Frequency Ordered Stop   01/24/17 1800  ceFAZolin (ANCEF) IVPB 2g/100 mL premix     2 g 200 mL/hr over 30 Minutes Intravenous Every 6 hours 01/24/17 1153 01/25/17 0011   01/24/17 0700  ceFAZolin (ANCEF) IVPB 2g/100 mL premix     2 g 200 mL/hr over 30 Minutes Intravenous To ShortStay [facility] 01/21/17 0948 01/24/17 0800    .  She was given sequential compression devices, early ambulation, and ASA for DVT prophylaxis.  She benefited maximally from the hospital stay and there were no complications.    Recent vital signs:  Vitals:   01/25/17 2150 01/26/17 0520  BP: 125/65 130/60  Pulse:  89  Resp: 20 19  Temp: 98.7 F (37.1 C) 98.5 F (36.9 C)    Recent laboratory studies:  Lab Results  Component Value Date   HGB 8.9 (L) 01/26/2017   HGB 9.0 (L)  01/25/2017   HGB 11.9 (L) 01/17/2017   Lab Results  Component Value Date   WBC 12.6 (H) 01/26/2017   PLT 188 01/26/2017   No results found for: INR Lab Results  Component Value Date   NA 139 01/26/2017   K 3.9 01/26/2017   CL 108 01/26/2017   CO2 23 01/26/2017   BUN 17 01/26/2017   CREATININE 1.04 (H) 01/26/2017   GLUCOSE 141 (H) 01/26/2017    Discharge Medications:   Allergies as of 01/26/2017      Reactions   No Known Allergies       Medication List    TAKE these medications   allopurinol 100 MG tablet Commonly known as:  ZYLOPRIM Take 100 mg by mouth daily with supper.   amLODipine 10 MG tablet Commonly known as:  NORVASC Take 5 mg by mouth 2 (two) times daily.   aspirin 81 MG chewable tablet Chew 1 tablet (81 mg total) by mouth 2 (two) times daily. What changed:  when to take this   Biotin 1000 MCG tablet Take 1,000 mcg by mouth every other day.   cholecalciferol 400 units Tabs tablet Commonly known as:  VITAMIN D Take 400 Units by mouth daily.   diclofenac 75 MG EC tablet Commonly known as:  VOLTAREN Take 75 mg by mouth 2 (two) times daily.   docusate sodium 100 MG capsule  Commonly known as:  COLACE Take 1 capsule (100 mg total) by mouth 2 (two) times daily. What changed:  when to take this  reasons to take this   glimepiride 4 MG tablet Commonly known as:  AMARYL Take 4 mg by mouth daily with breakfast.   HYDROcodone-acetaminophen 5-325 MG tablet Commonly known as:  NORCO/VICODIN Take 1-2 tablets by mouth every 4 (four) hours as needed (breakthrough pain).   losartan-hydrochlorothiazide 100-25 MG tablet Commonly known as:  HYZAAR Take 1 tablet by mouth daily with breakfast.   metoprolol succinate 100 MG 24 hr tablet Commonly known as:  TOPROL-XL Take 100 mg by mouth daily with breakfast. Take with or immediately following a meal.   ondansetron 4 MG tablet Commonly known as:  ZOFRAN Take 1 tablet (4 mg total) by mouth every 6 (six)  hours as needed for nausea.   potassium chloride 10 MEQ tablet Commonly known as:  K-DUR Take 10 mEq by mouth 2 (two) times daily.   SALONPAS PAIN RELIEF PATCH EX Apply 1 patch topically daily as needed.   senna 8.6 MG Tabs tablet Commonly known as:  SENOKOT Take 2 tablets (17.2 mg total) by mouth at bedtime.   TOUJEO SOLOSTAR 300 UNIT/ML Sopn Generic drug:  Insulin Glargine Inject 45 Units into the skin at bedtime.   TRULICITY 8.78 MV/6.7MC Sopn Generic drug:  Dulaglutide Inject 0.75 mg into the skin every Friday.       Diagnostic Studies: Dg Knee Left Port  Result Date: 01/24/2017 CLINICAL DATA:  66 year old female post total left knee replacement. Initial encounter. EXAM: PORTABLE LEFT KNEE - 1-2 VIEW COMPARISON:  None. FINDINGS: Post total left knee replacement without complication noted. Patella appears slightly high-riding. Gas within joint space felt be related to recent surgery. IMPRESSION: Post total left knee replacement without complication noted. Patella appears slightly high-riding. Electronically Signed   By: Genia Del M.D.   On: 01/24/2017 10:57    Disposition: 01-Home or Self Care  Discharge Instructions    Call MD / Call 911    Complete by:  As directed    If you experience chest pain or shortness of breath, CALL 911 and be transported to the hospital emergency room.  If you develope a fever above 101 F, pus (white drainage) or increased drainage or redness at the wound, or calf pain, call your surgeon's office.   Constipation Prevention    Complete by:  As directed    Drink plenty of fluids.  Prune juice may be helpful.  You may use a stool softener, such as Colace (over the counter) 100 mg twice a day.  Use MiraLax (over the counter) for constipation as needed.   Diet - low sodium heart healthy    Complete by:  As directed    Do not put a pillow under the knee. Place it under the heel.    Complete by:  As directed    Driving restrictions    Complete  by:  As directed    No driving for 6 weeks   Increase activity slowly as tolerated    Complete by:  As directed    Lifting restrictions    Complete by:  As directed    No lifting for 6 weeks   TED hose    Complete by:  As directed    Use stockings (TED hose) for 2 weeks on both leg(s).  You may remove them at night for sleeping.      Follow-up Information  Casia Corti, Horald Pollen, MD. Schedule an appointment as soon as possible for a visit in 2 weeks.   Specialty:  Orthopedic Surgery Why:  For wound re-check Contact information: [address]. [address] [place] [address] [phone]            Signed: Elie Goody 01/26/2017, 7:16 AM

## 2017-01-25 NOTE — Progress Notes (Signed)
   Subjective:  Patient reports pain as mild to moderate.  C/o emesis x1 after IV pain meds given last night. Denies CP/SOB.  Objective:   VITALS:   Vitals:   01/24/17 1152 01/24/17 2300 01/25/17 0048 01/25/17 0640  BP: (!) 164/72 (!) 145/62 (!) 121/47 (!) 120/57  Pulse: 82 82    Resp: 16     Temp: 98.2 F (36.8 C) 99.8 F (37.7 C) 99.6 F (37.6 C) 98.3 F (36.8 C)  TempSrc:  Oral Oral Oral  SpO2: 100% 97%  96%    NAD ABD soft Sensation intact distally Intact pulses distally Dorsiflexion/Plantar flexion intact Incision: dressing C/D/I Compartment soft   Lab Results  Component Value Date   WBC 8.1 01/25/2017   HGB 9.0 (L) 01/25/2017   HCT 26.5 (L) 01/25/2017   MCV 85.8 01/25/2017   PLT 171 01/25/2017   BMET    Component Value Date/Time   NA 141 01/25/2017 0539   K 2.8 (L) 01/25/2017 0539   CL 109 01/25/2017 0539   CO2 24 01/25/2017 0539   GLUCOSE 87 01/25/2017 0539   BUN 15 01/25/2017 0539   CREATININE 1.07 (H) 01/25/2017 0539   CALCIUM 8.6 (L) 01/25/2017 0539   GFRNONAA 52 (L) 01/25/2017 0539   GFRAA >60 01/25/2017 0539     Assessment/Plan: 1 Day Post-Op   Principal Problem:   Osteoarthritis of left knee   WBAT with walker ABLA: asymptomatic, monitor, recheck in am PO pain control DVT ppx: ASA, SCDs, TEDs Hypokalemia: already on PO supplementation, d/c IVFs, recheck in am PT/OT DM2: home regimen, BS in range, monitor Dispo: recheck labs in am and likely d/c home tomorrow, outpatient PT already set up, has DME already   Elie Goody 01/25/2017, 7:45 AM   Rod Can, MD Cell [phone]

## 2017-01-26 LAB — BASIC METABOLIC PANEL
Anion gap: 8 (ref 5–15)
BUN: 17 mg/dL (ref 6–20)
CALCIUM: 9.4 mg/dL (ref 8.9–10.3)
CHLORIDE: 108 mmol/L (ref 101–111)
CO2: 23 mmol/L (ref 22–32)
CREATININE: 1.04 mg/dL — AB (ref 0.44–1.00)
GFR calc non Af Amer: 54 mL/min — ABNORMAL LOW (ref >60)
Glucose, Bld: 141 mg/dL — ABNORMAL HIGH (ref 65–99)
Potassium: 3.9 mmol/L (ref 3.5–5.1)
SODIUM: 139 mmol/L (ref 135–145)

## 2017-01-26 LAB — CBC
HCT: 27.2 % — ABNORMAL LOW (ref 36.0–46.0)
HEMOGLOBIN: 8.9 g/dL — AB (ref 12.0–15.0)
MCH: 27.9 pg (ref 26.0–34.0)
MCHC: 32.7 g/dL (ref 30.0–36.0)
MCV: 85.3 fL (ref 78.0–100.0)
Platelets: 188 10*3/uL (ref 150–400)
RBC: 3.19 MIL/uL — ABNORMAL LOW (ref 3.87–5.11)
RDW: 13.8 % (ref 11.5–15.5)
WBC: 12.6 10*3/uL — ABNORMAL HIGH (ref 4.0–10.5)

## 2017-01-26 LAB — GLUCOSE, CAPILLARY
GLUCOSE-CAPILLARY: 140 mg/dL — AB (ref 65–99)
GLUCOSE-CAPILLARY: 102 mg/dL — AB (ref 65–99)

## 2017-01-26 NOTE — Progress Notes (Signed)
Physical Therapy Treatment Patient Details Name: Carol Keith MRN: [id number] DOB: [birth year] Today's Date: 01/26/2017    History of Present Illness Pt is 66 y/o female s/p elective L TKA secondary to L knee OA. PMH inludes HTN, heart murmur, DM, and R TKA.     PT Comments    Patient is making good progress with PT.  From a mobility standpoint anticipate patient will be ready for DC home when medically ready.    Follow Up Recommendations  Outpatient PT;Supervision/Assistance - 24 hour     Equipment Recommendations  None recommended by PT    Recommendations for Other Services       Precautions / Restrictions Precautions Precautions: Knee Precaution Comments: reviewed precautions/positioning with pt Restrictions Weight Bearing Restrictions: Yes LLE Weight Bearing: Weight bearing as tolerated    Mobility  Bed Mobility               General bed mobility comments: pt OOB in chair upon arrival  Transfers Overall transfer level: Modified independent Equipment used: Rolling walker (2 wheeled) Transfers: Sit to/from Stand           General transfer comment: increased time/effort  Ambulation/Gait Ambulation/Gait assistance: Supervision Ambulation Distance (Feet): 150 Feet Assistive device: Rolling walker (2 wheeled) Gait Pattern/deviations: Step-through pattern;Decreased weight shift to left Gait velocity: Decreased    General Gait Details: cues for forward gaze and posture initially; pt with improved weightbearing, step length symmetry, and cadence   Stairs            Wheelchair Mobility    Modified Rankin (Stroke Patients Only)       Balance Overall balance assessment: Needs assistance Sitting-balance support: No upper extremity supported;Feet supported Sitting balance-Leahy Scale: Good       Standing balance-Leahy Scale: Fair                              Cognition Arousal/Alertness: Awake/alert Behavior During Therapy:  WFL for tasks assessed/performed Overall Cognitive Status: Within Functional Limits for tasks assessed                                        Exercises Total Joint Exercises Quad Sets: AROM;Left;10 reps Heel Slides: AROM;Left;10 reps Straight Leg Raises: AROM;Left;10 reps Long Arc Quad: AROM;Left;10 reps;Seated Knee Flexion: AROM;Left;5 reps;Seated;Other (comment) (10 second holds) Goniometric ROM: 5-90    General Comments        Pertinent Vitals/Pain Pain Assessment: Faces Faces Pain Scale: Hurts little more Pain Location: L knee  Pain Descriptors / Indicators: Sore;Aching Pain Intervention(s): Limited activity within patient's tolerance;Monitored during session;Repositioned;RN gave pain meds during session    Home Living                      Prior Function            PT Goals (current goals can now be found in the care plan section) Acute Rehab PT Goals Patient Stated Goal: to go home  PT Goal Formulation: With patient Time For Goal Achievement: 01/31/17 Potential to Achieve Goals: Good Progress towards PT goals: Progressing toward goals    Frequency    7X/week      PT Plan Current plan remains appropriate    Co-evaluation             End of Session Equipment Utilized  During Treatment: Gait belt Activity Tolerance: Patient tolerated treatment well Patient left: with call bell/phone within reach;in chair Nurse Communication: Mobility status PT Visit Diagnosis: Other abnormalities of gait and mobility (R26.89);Pain Pain - Right/Left: Left Pain - part of body: Knee     Time: 4707-6151 PT Time Calculation (min) (ACUTE ONLY): 33 min  Charges:  $Gait Training: 8-22 mins $Therapeutic Exercise: 8-22 mins                    G Codes:       Earney Navy, PTA Pager: [phone]     Darliss Cheney 01/26/2017, 1:26 PM

## 2017-01-26 NOTE — Progress Notes (Signed)
Occupational Therapy Treatment Patient Details Name: Carol Keith MRN: [id number] DOB: 06-18-1951 Today's Date: 01/26/2017    History of present illness Pt is 66 y/o female s/p elective L TKA secondary to L knee OA. PMH inludes HTN, heart murmur, DM, and R TKA.    OT comments  Pt making good progress towards goals. Educated pt on LB dressing techniques and Tub transfer with 3N1. Pt demonstrated understanding and required Min guard A for safety. Provided pt with education and answered all pt question. Continue to recommend pt dc home. All acute OT needs met. Will sign off. Thank you.    Follow Up Recommendations  No OT follow up;Supervision - Intermittent    Equipment Recommendations  None recommended by OT;Other (comment) (Per pt report, she has DME from previous surgery)    Recommendations for Other Services      Precautions / Restrictions Precautions Precautions: Knee Precaution Booklet Issued: Yes (comment) Precaution Comments: reviewed precautions/positioning with pt Restrictions Weight Bearing Restrictions: Yes LLE Weight Bearing: Weight bearing as tolerated       Mobility Bed Mobility Overal bed mobility: Modified Independent Bed Mobility: Sit to Supine           General bed mobility comments: increased time  Transfers Overall transfer level: Needs assistance Equipment used: Rolling walker (2 wheeled) Transfers: Sit to/from Stand Sit to Stand: Supervision         General transfer comment: supervision for safety; demo of safe hand placement    Balance Overall balance assessment: Needs assistance Sitting-balance support: No upper extremity supported;Feet supported Sitting balance-Leahy Scale: Good     Standing balance support: Bilateral upper extremity supported;During functional activity Standing balance-Leahy Scale: Fair Standing balance comment: able to static stand and wash hands at sink without UE support                            ADL either performed or assessed with clinical judgement   ADL Overall ADL's : Needs assistance/impaired                 Upper Body Dressing : Set up;Sitting Upper Body Dressing Details (indicate cue type and reason): Pt donned shirt at EOB Lower Body Dressing: Min guard;Sit to/from stand Lower Body Dressing Details (indicate cue type and reason): Pt donned pants with min guard for sit<>stand         Tub/ Shower Transfer: Tub transfer;Min guard;3 in 1;Rolling walker;Ambulation;Cueing for sequencing Tub/Shower Transfer Details (indicate cue type and reason): Educated pt on tub transfer and pt demonstrated understanding with Min guard for safety Functional mobility during ADLs: Min guard;Rolling walker General ADL Comments: Pt dmeonstrates good funcitonal progress. Feel she is safe for dc home.      Vision       Perception     Praxis      Cognition Arousal/Alertness: Awake/alert Behavior During Therapy: WFL for tasks assessed/performed Overall Cognitive Status: Within Functional Limits for tasks assessed                                          Exercises     Shoulder Instructions       General Comments Answered all pt questions    Pertinent Vitals/ Pain       Pain Assessment: 0-10 Pain Score: 3  Pain Location: L knee with weightbearing Pain Descriptors /  Indicators: Sore;Aching Pain Intervention(s): Monitored during session  Home Living                                          Prior Functioning/Environment              Frequency  Min 2X/week        Progress Toward Goals  OT Goals(current goals can now be found in the care plan section)  Progress towards OT goals: Progressing toward goals  Acute Rehab OT Goals Patient Stated Goal: to go home  Time For Goal Achievement: 02/08/17 Potential to Achieve Goals: Good ADL Goals Pt Will Perform Lower Body Dressing: with supervision;with adaptive equipment;sit  to/from stand;sitting/lateral leans Pt Will Transfer to Toilet: with supervision;ambulating;bedside commode Pt Will Perform Toileting - Clothing Manipulation and hygiene: with modified independence;sitting/lateral leans Pt Will Perform Tub/Shower Transfer: Tub transfer;with min guard assist;ambulating;3 in 1;rolling walker  Plan Discharge plan remains appropriate    Co-evaluation                 End of Session Equipment Utilized During Treatment: Gait belt;Rolling walker  OT Visit Diagnosis: Other abnormalities of gait and mobility (R26.89);Muscle weakness (generalized) (M62.81);Pain Pain - Right/Left: Left Pain - part of body: Knee   Activity Tolerance Patient tolerated treatment well   Patient Left in chair;with call bell/phone within reach   Nurse Communication          Time: 0757-0828 OT Time Calculation (min): 31 min  Charges: OT General Charges $OT Visit: 1 Procedure OT Treatments $Self Care/Home Management : 23-37 mins  [place], OTR/L [place] 01/26/2017, 9:33 AM

## 2017-01-26 NOTE — Progress Notes (Signed)
   Subjective:  Patient reports pain as mild to moderate.  Denies N/V/CP/SOB.  Objective:   VITALS:   Vitals:   01/25/17 0640 01/25/17 1100 01/25/17 2150 01/26/17 0520  BP: (!) 120/57 (!) 157/71 125/65 130/60  Pulse:  77  89  Resp:  16 20 19   Temp: 98.3 F (36.8 C) 98.5 F (36.9 C) 98.7 F (37.1 C) 98.5 F (36.9 C)  TempSrc: Oral Oral Oral Oral  SpO2: 96% 98% 98% 98%    NAD ABD soft Sensation intact distally Intact pulses distally Dorsiflexion/Plantar flexion intact Incision: dressing C/D/I Compartment soft   Lab Results  Component Value Date   WBC 12.6 (H) 01/26/2017   HGB 8.9 (L) 01/26/2017   HCT 27.2 (L) 01/26/2017   MCV 85.3 01/26/2017   PLT 188 01/26/2017   BMET    Component Value Date/Time   NA 139 01/26/2017 0512   K 3.9 01/26/2017 0512   CL 108 01/26/2017 0512   CO2 23 01/26/2017 0512   GLUCOSE 141 (H) 01/26/2017 0512   BUN 17 01/26/2017 0512   CREATININE 1.04 (H) 01/26/2017 0512   CALCIUM 9.4 01/26/2017 0512   GFRNONAA 54 (L) 01/26/2017 0512   GFRAA >60 01/26/2017 0512     Assessment/Plan: 2 Days Post-Op   Principal Problem:   Osteoarthritis of left knee   WBAT with walker ABLA: asymptomatic, monitor PO pain control DVT ppx: ASA, SCDs, TEDs Hypokalemia: resolved PT/OT DM2: home regimen, BS in range, monitor Dispo: d/c home, outpatient PT already set up, has DME already   Carol Keith 01/26/2017, 7:14 AM   Rod Can, MD Cell [phone]

## 2017-01-31 DIAGNOSIS — Z471 Aftercare following joint replacement surgery: Secondary | ICD-10-CM | POA: Diagnosis not present

## 2017-01-31 DIAGNOSIS — R2689 Other abnormalities of gait and mobility: Secondary | ICD-10-CM | POA: Diagnosis not present

## 2017-01-31 DIAGNOSIS — Z96652 Presence of left artificial knee joint: Secondary | ICD-10-CM | POA: Diagnosis not present

## 2017-02-02 DIAGNOSIS — Z471 Aftercare following joint replacement surgery: Secondary | ICD-10-CM | POA: Diagnosis not present

## 2017-02-02 DIAGNOSIS — Z96652 Presence of left artificial knee joint: Secondary | ICD-10-CM | POA: Diagnosis not present

## 2017-02-02 DIAGNOSIS — R2689 Other abnormalities of gait and mobility: Secondary | ICD-10-CM | POA: Diagnosis not present

## 2017-02-04 DIAGNOSIS — R2689 Other abnormalities of gait and mobility: Secondary | ICD-10-CM | POA: Diagnosis not present

## 2017-02-04 DIAGNOSIS — Z471 Aftercare following joint replacement surgery: Secondary | ICD-10-CM | POA: Diagnosis not present

## 2017-02-04 DIAGNOSIS — Z96652 Presence of left artificial knee joint: Secondary | ICD-10-CM | POA: Diagnosis not present

## 2017-02-07 DIAGNOSIS — E119 Type 2 diabetes mellitus without complications: Secondary | ICD-10-CM | POA: Diagnosis not present

## 2017-02-07 DIAGNOSIS — I10 Essential (primary) hypertension: Secondary | ICD-10-CM | POA: Diagnosis not present

## 2017-02-07 DIAGNOSIS — E7800 Pure hypercholesterolemia, unspecified: Secondary | ICD-10-CM | POA: Diagnosis not present

## 2017-02-07 DIAGNOSIS — I2510 Atherosclerotic heart disease of native coronary artery without angina pectoris: Secondary | ICD-10-CM | POA: Diagnosis not present

## 2017-02-07 DIAGNOSIS — Z471 Aftercare following joint replacement surgery: Secondary | ICD-10-CM | POA: Diagnosis not present

## 2017-02-07 DIAGNOSIS — R2689 Other abnormalities of gait and mobility: Secondary | ICD-10-CM | POA: Diagnosis not present

## 2017-02-07 DIAGNOSIS — Z96652 Presence of left artificial knee joint: Secondary | ICD-10-CM | POA: Diagnosis not present

## 2017-02-08 DIAGNOSIS — Z96652 Presence of left artificial knee joint: Secondary | ICD-10-CM | POA: Diagnosis not present

## 2017-02-09 DIAGNOSIS — Z96652 Presence of left artificial knee joint: Secondary | ICD-10-CM | POA: Diagnosis not present

## 2017-02-09 DIAGNOSIS — Z471 Aftercare following joint replacement surgery: Secondary | ICD-10-CM | POA: Diagnosis not present

## 2017-02-09 DIAGNOSIS — R2689 Other abnormalities of gait and mobility: Secondary | ICD-10-CM | POA: Diagnosis not present

## 2017-02-11 DIAGNOSIS — Z96652 Presence of left artificial knee joint: Secondary | ICD-10-CM | POA: Diagnosis not present

## 2017-02-11 DIAGNOSIS — Z471 Aftercare following joint replacement surgery: Secondary | ICD-10-CM | POA: Diagnosis not present

## 2017-02-11 DIAGNOSIS — R2689 Other abnormalities of gait and mobility: Secondary | ICD-10-CM | POA: Diagnosis not present

## 2017-02-14 DIAGNOSIS — Z96652 Presence of left artificial knee joint: Secondary | ICD-10-CM | POA: Diagnosis not present

## 2017-02-14 DIAGNOSIS — Z471 Aftercare following joint replacement surgery: Secondary | ICD-10-CM | POA: Diagnosis not present

## 2017-02-14 DIAGNOSIS — R2689 Other abnormalities of gait and mobility: Secondary | ICD-10-CM | POA: Diagnosis not present

## 2017-02-16 DIAGNOSIS — Z471 Aftercare following joint replacement surgery: Secondary | ICD-10-CM | POA: Diagnosis not present

## 2017-02-16 DIAGNOSIS — Z96652 Presence of left artificial knee joint: Secondary | ICD-10-CM | POA: Diagnosis not present

## 2017-02-16 DIAGNOSIS — R2689 Other abnormalities of gait and mobility: Secondary | ICD-10-CM | POA: Diagnosis not present

## 2017-02-18 DIAGNOSIS — Z96652 Presence of left artificial knee joint: Secondary | ICD-10-CM | POA: Diagnosis not present

## 2017-02-18 DIAGNOSIS — Z471 Aftercare following joint replacement surgery: Secondary | ICD-10-CM | POA: Diagnosis not present

## 2017-02-18 DIAGNOSIS — R2689 Other abnormalities of gait and mobility: Secondary | ICD-10-CM | POA: Diagnosis not present

## 2017-02-21 DIAGNOSIS — R2689 Other abnormalities of gait and mobility: Secondary | ICD-10-CM | POA: Diagnosis not present

## 2017-02-21 DIAGNOSIS — Z471 Aftercare following joint replacement surgery: Secondary | ICD-10-CM | POA: Diagnosis not present

## 2017-02-21 DIAGNOSIS — Z96652 Presence of left artificial knee joint: Secondary | ICD-10-CM | POA: Diagnosis not present

## 2017-02-23 DIAGNOSIS — Z96652 Presence of left artificial knee joint: Secondary | ICD-10-CM | POA: Diagnosis not present

## 2017-02-23 DIAGNOSIS — Z471 Aftercare following joint replacement surgery: Secondary | ICD-10-CM | POA: Diagnosis not present

## 2017-02-23 DIAGNOSIS — M25662 Stiffness of left knee, not elsewhere classified: Secondary | ICD-10-CM | POA: Diagnosis not present

## 2017-02-23 DIAGNOSIS — R2689 Other abnormalities of gait and mobility: Secondary | ICD-10-CM | POA: Diagnosis not present

## 2017-02-23 DIAGNOSIS — M6281 Muscle weakness (generalized): Secondary | ICD-10-CM | POA: Diagnosis not present

## 2017-02-25 DIAGNOSIS — Z96652 Presence of left artificial knee joint: Secondary | ICD-10-CM | POA: Diagnosis not present

## 2017-02-25 DIAGNOSIS — Z471 Aftercare following joint replacement surgery: Secondary | ICD-10-CM | POA: Diagnosis not present

## 2017-02-25 DIAGNOSIS — M6281 Muscle weakness (generalized): Secondary | ICD-10-CM | POA: Diagnosis not present

## 2017-02-25 DIAGNOSIS — R2689 Other abnormalities of gait and mobility: Secondary | ICD-10-CM | POA: Diagnosis not present

## 2017-02-25 DIAGNOSIS — M25662 Stiffness of left knee, not elsewhere classified: Secondary | ICD-10-CM | POA: Diagnosis not present

## 2017-02-28 DIAGNOSIS — M25662 Stiffness of left knee, not elsewhere classified: Secondary | ICD-10-CM | POA: Diagnosis not present

## 2017-02-28 DIAGNOSIS — M6281 Muscle weakness (generalized): Secondary | ICD-10-CM | POA: Diagnosis not present

## 2017-02-28 DIAGNOSIS — Z96652 Presence of left artificial knee joint: Secondary | ICD-10-CM | POA: Diagnosis not present

## 2017-02-28 DIAGNOSIS — R2689 Other abnormalities of gait and mobility: Secondary | ICD-10-CM | POA: Diagnosis not present

## 2017-02-28 DIAGNOSIS — Z471 Aftercare following joint replacement surgery: Secondary | ICD-10-CM | POA: Diagnosis not present

## 2017-03-03 DIAGNOSIS — Z471 Aftercare following joint replacement surgery: Secondary | ICD-10-CM | POA: Diagnosis not present

## 2017-03-03 DIAGNOSIS — M25662 Stiffness of left knee, not elsewhere classified: Secondary | ICD-10-CM | POA: Diagnosis not present

## 2017-03-03 DIAGNOSIS — M6281 Muscle weakness (generalized): Secondary | ICD-10-CM | POA: Diagnosis not present

## 2017-03-03 DIAGNOSIS — Z96652 Presence of left artificial knee joint: Secondary | ICD-10-CM | POA: Diagnosis not present

## 2017-03-03 DIAGNOSIS — R2689 Other abnormalities of gait and mobility: Secondary | ICD-10-CM | POA: Diagnosis not present

## 2017-03-04 DIAGNOSIS — Z96652 Presence of left artificial knee joint: Secondary | ICD-10-CM | POA: Diagnosis not present

## 2017-03-04 DIAGNOSIS — M6281 Muscle weakness (generalized): Secondary | ICD-10-CM | POA: Diagnosis not present

## 2017-03-04 DIAGNOSIS — R2689 Other abnormalities of gait and mobility: Secondary | ICD-10-CM | POA: Diagnosis not present

## 2017-03-04 DIAGNOSIS — Z471 Aftercare following joint replacement surgery: Secondary | ICD-10-CM | POA: Diagnosis not present

## 2017-03-04 DIAGNOSIS — M25662 Stiffness of left knee, not elsewhere classified: Secondary | ICD-10-CM | POA: Diagnosis not present

## 2017-03-08 DIAGNOSIS — Z96652 Presence of left artificial knee joint: Secondary | ICD-10-CM | POA: Diagnosis not present

## 2017-03-08 DIAGNOSIS — I2510 Atherosclerotic heart disease of native coronary artery without angina pectoris: Secondary | ICD-10-CM | POA: Diagnosis not present

## 2017-03-08 DIAGNOSIS — Z471 Aftercare following joint replacement surgery: Secondary | ICD-10-CM | POA: Diagnosis not present

## 2017-03-08 DIAGNOSIS — I10 Essential (primary) hypertension: Secondary | ICD-10-CM | POA: Diagnosis not present

## 2017-03-08 DIAGNOSIS — E119 Type 2 diabetes mellitus without complications: Secondary | ICD-10-CM | POA: Diagnosis not present

## 2017-03-08 DIAGNOSIS — E7800 Pure hypercholesterolemia, unspecified: Secondary | ICD-10-CM | POA: Diagnosis not present

## 2017-03-09 DIAGNOSIS — E559 Vitamin D deficiency, unspecified: Secondary | ICD-10-CM | POA: Diagnosis not present

## 2017-03-09 DIAGNOSIS — R809 Proteinuria, unspecified: Secondary | ICD-10-CM | POA: Diagnosis not present

## 2017-03-09 DIAGNOSIS — Z79899 Other long term (current) drug therapy: Secondary | ICD-10-CM | POA: Diagnosis not present

## 2017-03-09 DIAGNOSIS — I129 Hypertensive chronic kidney disease with stage 1 through stage 4 chronic kidney disease, or unspecified chronic kidney disease: Secondary | ICD-10-CM | POA: Diagnosis not present

## 2017-03-09 DIAGNOSIS — N183 Chronic kidney disease, stage 3 (moderate): Secondary | ICD-10-CM | POA: Diagnosis not present

## 2017-03-09 DIAGNOSIS — D509 Iron deficiency anemia, unspecified: Secondary | ICD-10-CM | POA: Diagnosis not present

## 2017-03-12 NOTE — Addendum Note (Signed)
Addendum  created 03/12/17 0816 by Karene Bracken, MD   Sign clinical note    

## 2017-03-15 DIAGNOSIS — N183 Chronic kidney disease, stage 3 (moderate): Secondary | ICD-10-CM | POA: Diagnosis not present

## 2017-03-15 DIAGNOSIS — D638 Anemia in other chronic diseases classified elsewhere: Secondary | ICD-10-CM | POA: Diagnosis not present

## 2017-03-17 DIAGNOSIS — M1710 Unilateral primary osteoarthritis, unspecified knee: Secondary | ICD-10-CM | POA: Diagnosis not present

## 2017-03-17 DIAGNOSIS — E119 Type 2 diabetes mellitus without complications: Secondary | ICD-10-CM | POA: Diagnosis not present

## 2017-03-17 DIAGNOSIS — E7800 Pure hypercholesterolemia, unspecified: Secondary | ICD-10-CM | POA: Diagnosis not present

## 2017-03-17 DIAGNOSIS — Z713 Dietary counseling and surveillance: Secondary | ICD-10-CM | POA: Diagnosis not present

## 2017-03-17 DIAGNOSIS — Z6833 Body mass index (BMI) 33.0-33.9, adult: Secondary | ICD-10-CM | POA: Diagnosis not present

## 2017-03-17 DIAGNOSIS — K219 Gastro-esophageal reflux disease without esophagitis: Secondary | ICD-10-CM | POA: Diagnosis not present

## 2017-03-17 DIAGNOSIS — Z299 Encounter for prophylactic measures, unspecified: Secondary | ICD-10-CM | POA: Diagnosis not present

## 2017-03-17 DIAGNOSIS — C50919 Malignant neoplasm of unspecified site of unspecified female breast: Secondary | ICD-10-CM | POA: Diagnosis not present

## 2017-03-17 DIAGNOSIS — I10 Essential (primary) hypertension: Secondary | ICD-10-CM | POA: Diagnosis not present

## 2017-03-17 DIAGNOSIS — I739 Peripheral vascular disease, unspecified: Secondary | ICD-10-CM | POA: Diagnosis not present

## 2017-04-08 DIAGNOSIS — C50919 Malignant neoplasm of unspecified site of unspecified female breast: Secondary | ICD-10-CM | POA: Diagnosis not present

## 2017-04-08 DIAGNOSIS — M1710 Unilateral primary osteoarthritis, unspecified knee: Secondary | ICD-10-CM | POA: Diagnosis not present

## 2017-04-08 DIAGNOSIS — E7800 Pure hypercholesterolemia, unspecified: Secondary | ICD-10-CM | POA: Diagnosis not present

## 2017-04-08 DIAGNOSIS — I10 Essential (primary) hypertension: Secondary | ICD-10-CM | POA: Diagnosis not present

## 2017-04-08 DIAGNOSIS — Z299 Encounter for prophylactic measures, unspecified: Secondary | ICD-10-CM | POA: Diagnosis not present

## 2017-04-08 DIAGNOSIS — Z713 Dietary counseling and surveillance: Secondary | ICD-10-CM | POA: Diagnosis not present

## 2017-04-08 DIAGNOSIS — Z6834 Body mass index (BMI) 34.0-34.9, adult: Secondary | ICD-10-CM | POA: Diagnosis not present

## 2017-04-08 DIAGNOSIS — I739 Peripheral vascular disease, unspecified: Secondary | ICD-10-CM | POA: Diagnosis not present

## 2017-04-08 DIAGNOSIS — E119 Type 2 diabetes mellitus without complications: Secondary | ICD-10-CM | POA: Diagnosis not present

## 2017-04-15 DIAGNOSIS — I739 Peripheral vascular disease, unspecified: Secondary | ICD-10-CM | POA: Diagnosis not present

## 2017-04-15 DIAGNOSIS — Z713 Dietary counseling and surveillance: Secondary | ICD-10-CM | POA: Diagnosis not present

## 2017-04-15 DIAGNOSIS — Z299 Encounter for prophylactic measures, unspecified: Secondary | ICD-10-CM | POA: Diagnosis not present

## 2017-04-15 DIAGNOSIS — E7800 Pure hypercholesterolemia, unspecified: Secondary | ICD-10-CM | POA: Diagnosis not present

## 2017-04-15 DIAGNOSIS — M549 Dorsalgia, unspecified: Secondary | ICD-10-CM | POA: Diagnosis not present

## 2017-04-15 DIAGNOSIS — E1165 Type 2 diabetes mellitus with hyperglycemia: Secondary | ICD-10-CM | POA: Diagnosis not present

## 2017-04-15 DIAGNOSIS — Z6834 Body mass index (BMI) 34.0-34.9, adult: Secondary | ICD-10-CM | POA: Diagnosis not present

## 2017-04-15 DIAGNOSIS — C50919 Malignant neoplasm of unspecified site of unspecified female breast: Secondary | ICD-10-CM | POA: Diagnosis not present

## 2017-04-15 DIAGNOSIS — I10 Essential (primary) hypertension: Secondary | ICD-10-CM | POA: Diagnosis not present

## 2017-06-07 DIAGNOSIS — E7800 Pure hypercholesterolemia, unspecified: Secondary | ICD-10-CM | POA: Diagnosis not present

## 2017-06-07 DIAGNOSIS — I2510 Atherosclerotic heart disease of native coronary artery without angina pectoris: Secondary | ICD-10-CM | POA: Diagnosis not present

## 2017-06-07 DIAGNOSIS — E119 Type 2 diabetes mellitus without complications: Secondary | ICD-10-CM | POA: Diagnosis not present

## 2017-06-07 DIAGNOSIS — I10 Essential (primary) hypertension: Secondary | ICD-10-CM | POA: Diagnosis not present

## 2017-06-29 DIAGNOSIS — M549 Dorsalgia, unspecified: Secondary | ICD-10-CM | POA: Diagnosis not present

## 2017-06-29 DIAGNOSIS — E7800 Pure hypercholesterolemia, unspecified: Secondary | ICD-10-CM | POA: Diagnosis not present

## 2017-06-29 DIAGNOSIS — C50919 Malignant neoplasm of unspecified site of unspecified female breast: Secondary | ICD-10-CM | POA: Diagnosis not present

## 2017-06-29 DIAGNOSIS — I739 Peripheral vascular disease, unspecified: Secondary | ICD-10-CM | POA: Diagnosis not present

## 2017-06-29 DIAGNOSIS — Z6835 Body mass index (BMI) 35.0-35.9, adult: Secondary | ICD-10-CM | POA: Diagnosis not present

## 2017-06-29 DIAGNOSIS — I10 Essential (primary) hypertension: Secondary | ICD-10-CM | POA: Diagnosis not present

## 2017-06-29 DIAGNOSIS — M1710 Unilateral primary osteoarthritis, unspecified knee: Secondary | ICD-10-CM | POA: Diagnosis not present

## 2017-06-29 DIAGNOSIS — E1165 Type 2 diabetes mellitus with hyperglycemia: Secondary | ICD-10-CM | POA: Diagnosis not present

## 2017-06-29 DIAGNOSIS — Z299 Encounter for prophylactic measures, unspecified: Secondary | ICD-10-CM | POA: Diagnosis not present

## 2017-07-05 DIAGNOSIS — E7800 Pure hypercholesterolemia, unspecified: Secondary | ICD-10-CM | POA: Diagnosis not present

## 2017-07-05 DIAGNOSIS — E119 Type 2 diabetes mellitus without complications: Secondary | ICD-10-CM | POA: Diagnosis not present

## 2017-07-05 DIAGNOSIS — I10 Essential (primary) hypertension: Secondary | ICD-10-CM | POA: Diagnosis not present

## 2017-07-05 DIAGNOSIS — I2510 Atherosclerotic heart disease of native coronary artery without angina pectoris: Secondary | ICD-10-CM | POA: Diagnosis not present

## 2017-07-26 DIAGNOSIS — Z471 Aftercare following joint replacement surgery: Secondary | ICD-10-CM | POA: Diagnosis not present

## 2017-07-26 DIAGNOSIS — Z96652 Presence of left artificial knee joint: Secondary | ICD-10-CM | POA: Diagnosis not present

## 2017-08-02 DIAGNOSIS — E119 Type 2 diabetes mellitus without complications: Secondary | ICD-10-CM | POA: Diagnosis not present

## 2017-08-02 DIAGNOSIS — E7800 Pure hypercholesterolemia, unspecified: Secondary | ICD-10-CM | POA: Diagnosis not present

## 2017-08-02 DIAGNOSIS — I2510 Atherosclerotic heart disease of native coronary artery without angina pectoris: Secondary | ICD-10-CM | POA: Diagnosis not present

## 2017-08-02 DIAGNOSIS — I10 Essential (primary) hypertension: Secondary | ICD-10-CM | POA: Diagnosis not present

## 2017-08-03 DIAGNOSIS — R0600 Dyspnea, unspecified: Secondary | ICD-10-CM | POA: Diagnosis not present

## 2017-08-03 DIAGNOSIS — Z79899 Other long term (current) drug therapy: Secondary | ICD-10-CM | POA: Diagnosis not present

## 2017-08-03 DIAGNOSIS — I129 Hypertensive chronic kidney disease with stage 1 through stage 4 chronic kidney disease, or unspecified chronic kidney disease: Secondary | ICD-10-CM | POA: Diagnosis not present

## 2017-08-03 DIAGNOSIS — N183 Chronic kidney disease, stage 3 (moderate): Secondary | ICD-10-CM | POA: Diagnosis not present

## 2017-08-03 DIAGNOSIS — R609 Edema, unspecified: Secondary | ICD-10-CM | POA: Diagnosis not present

## 2017-08-03 DIAGNOSIS — E559 Vitamin D deficiency, unspecified: Secondary | ICD-10-CM | POA: Diagnosis not present

## 2017-08-03 DIAGNOSIS — D509 Iron deficiency anemia, unspecified: Secondary | ICD-10-CM | POA: Diagnosis not present

## 2017-08-03 DIAGNOSIS — Z23 Encounter for immunization: Secondary | ICD-10-CM | POA: Diagnosis not present

## 2017-08-03 DIAGNOSIS — R109 Unspecified abdominal pain: Secondary | ICD-10-CM | POA: Diagnosis not present

## 2017-08-09 DIAGNOSIS — N183 Chronic kidney disease, stage 3 (moderate): Secondary | ICD-10-CM | POA: Diagnosis not present

## 2017-08-09 DIAGNOSIS — D509 Iron deficiency anemia, unspecified: Secondary | ICD-10-CM | POA: Diagnosis not present

## 2017-08-09 DIAGNOSIS — Z79899 Other long term (current) drug therapy: Secondary | ICD-10-CM | POA: Diagnosis not present

## 2017-08-09 DIAGNOSIS — R809 Proteinuria, unspecified: Secondary | ICD-10-CM | POA: Diagnosis not present

## 2017-08-09 DIAGNOSIS — I129 Hypertensive chronic kidney disease with stage 1 through stage 4 chronic kidney disease, or unspecified chronic kidney disease: Secondary | ICD-10-CM | POA: Diagnosis not present

## 2017-08-09 DIAGNOSIS — E559 Vitamin D deficiency, unspecified: Secondary | ICD-10-CM | POA: Diagnosis not present

## 2017-08-16 DIAGNOSIS — E1129 Type 2 diabetes mellitus with other diabetic kidney complication: Secondary | ICD-10-CM | POA: Diagnosis not present

## 2017-08-16 DIAGNOSIS — N183 Chronic kidney disease, stage 3 (moderate): Secondary | ICD-10-CM | POA: Diagnosis not present

## 2017-08-16 DIAGNOSIS — I10 Essential (primary) hypertension: Secondary | ICD-10-CM | POA: Diagnosis not present

## 2017-08-17 DIAGNOSIS — I10 Essential (primary) hypertension: Secondary | ICD-10-CM | POA: Diagnosis not present

## 2017-08-17 DIAGNOSIS — I2510 Atherosclerotic heart disease of native coronary artery without angina pectoris: Secondary | ICD-10-CM | POA: Diagnosis not present

## 2017-08-17 DIAGNOSIS — E119 Type 2 diabetes mellitus without complications: Secondary | ICD-10-CM | POA: Diagnosis not present

## 2017-08-17 DIAGNOSIS — E7800 Pure hypercholesterolemia, unspecified: Secondary | ICD-10-CM | POA: Diagnosis not present

## 2017-09-03 IMAGING — CR DG KNEE 1-2V PORT*R*
2 series · 2 of 2 positions shown · non-contrast
Comparison: None.

CLINICAL DATA: 65-year-old female status post right knee
arthroplasty. Initial encounter.

EXAM:
PORTABLE RIGHT KNEE - 1-2 VIEW

[AP]
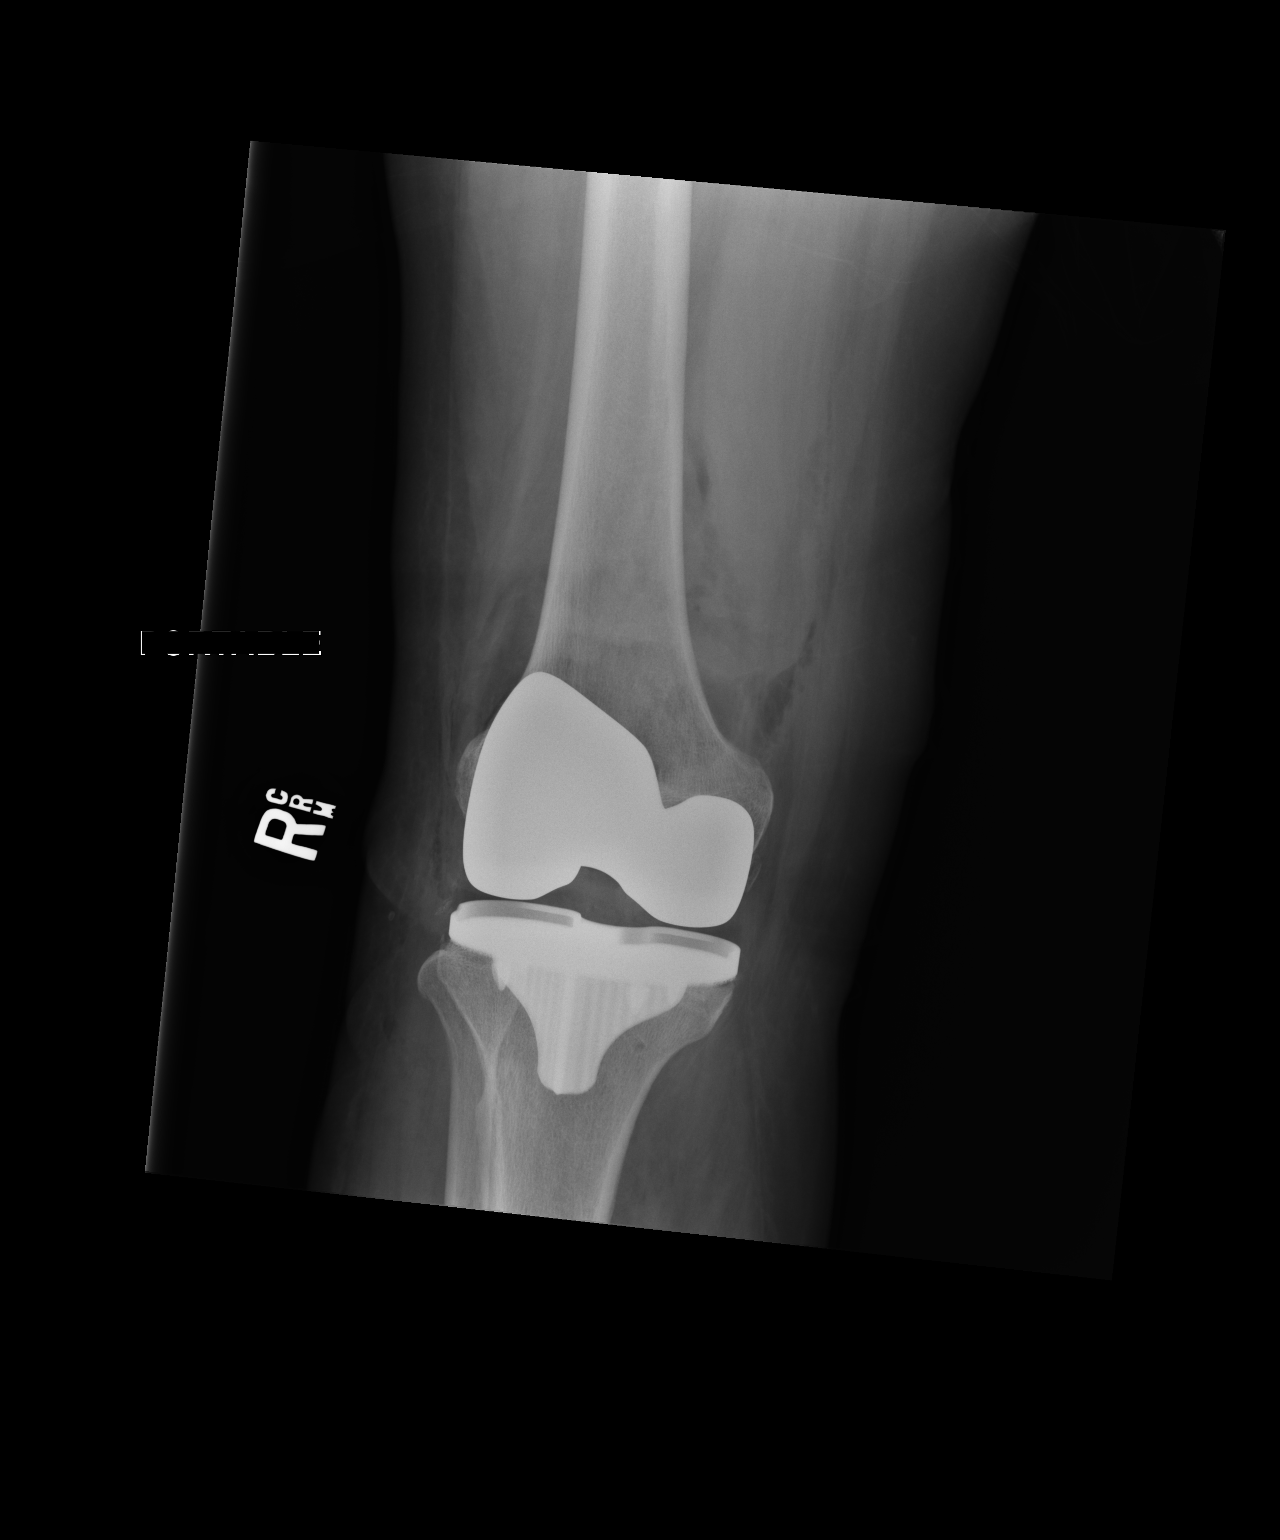

[xtable lateral]
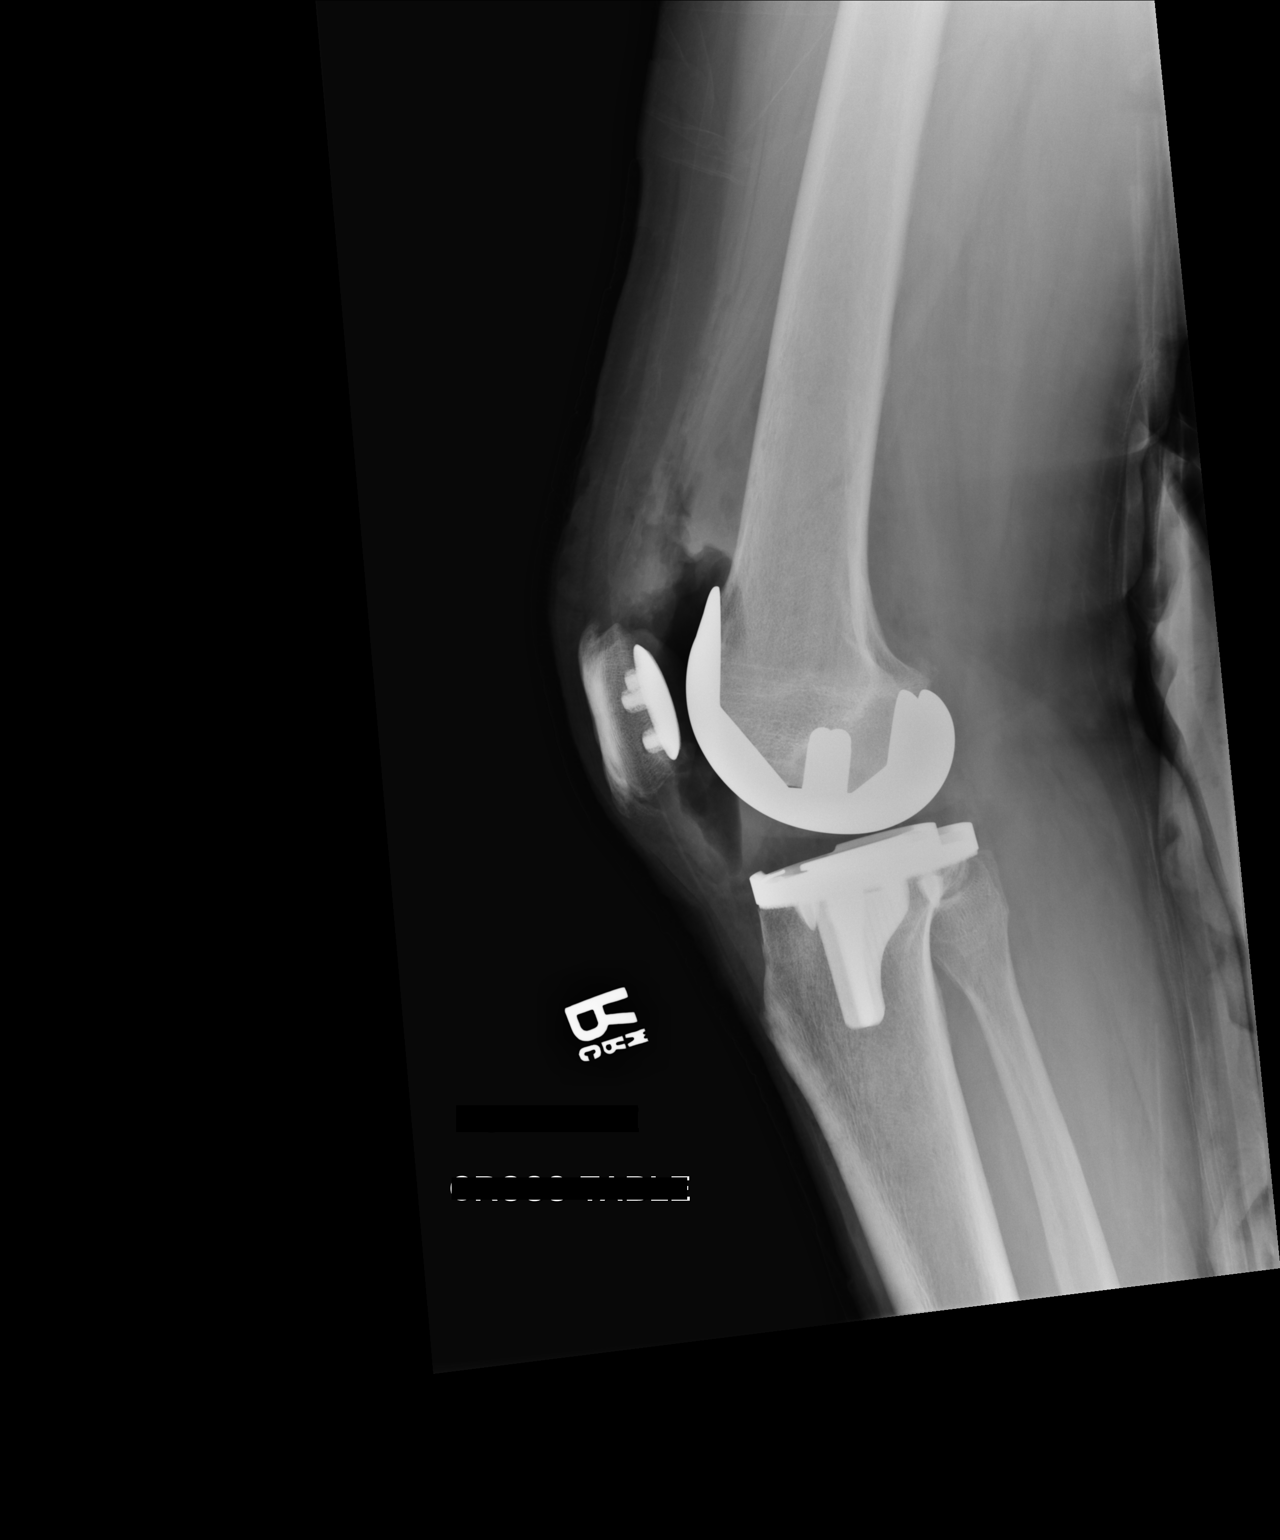

[2 of 2 positions shown; findings below may reference images not displayed]

FINDINGS: Portable AP and cross-table lateral views at 7488 hours. Sequelae of
right total knee arthroplasty. Hardware appears intact and normally
aligned. Postoperative changes to the surrounding soft tissues,
including moderate volume of gas within the joint. No unexpected
osseous changes identified.
IMPRESSION: Right total knee arthroplasty with no adverse features.

## 2017-09-09 DIAGNOSIS — Z1231 Encounter for screening mammogram for malignant neoplasm of breast: Secondary | ICD-10-CM | POA: Diagnosis not present

## 2017-09-21 DIAGNOSIS — E559 Vitamin D deficiency, unspecified: Secondary | ICD-10-CM | POA: Diagnosis not present

## 2017-09-21 DIAGNOSIS — Z6835 Body mass index (BMI) 35.0-35.9, adult: Secondary | ICD-10-CM | POA: Diagnosis not present

## 2017-09-21 DIAGNOSIS — Z299 Encounter for prophylactic measures, unspecified: Secondary | ICD-10-CM | POA: Diagnosis not present

## 2017-09-21 DIAGNOSIS — R5383 Other fatigue: Secondary | ICD-10-CM | POA: Diagnosis not present

## 2017-09-21 DIAGNOSIS — Z1331 Encounter for screening for depression: Secondary | ICD-10-CM | POA: Diagnosis not present

## 2017-09-21 DIAGNOSIS — Z1339 Encounter for screening examination for other mental health and behavioral disorders: Secondary | ICD-10-CM | POA: Diagnosis not present

## 2017-09-21 DIAGNOSIS — E7800 Pure hypercholesterolemia, unspecified: Secondary | ICD-10-CM | POA: Diagnosis not present

## 2017-09-21 DIAGNOSIS — Z7189 Other specified counseling: Secondary | ICD-10-CM | POA: Diagnosis not present

## 2017-09-21 DIAGNOSIS — Z1211 Encounter for screening for malignant neoplasm of colon: Secondary | ICD-10-CM | POA: Diagnosis not present

## 2017-09-21 DIAGNOSIS — Z79899 Other long term (current) drug therapy: Secondary | ICD-10-CM | POA: Diagnosis not present

## 2017-09-21 DIAGNOSIS — Z0000 Encounter for general adult medical examination without abnormal findings: Secondary | ICD-10-CM | POA: Diagnosis not present

## 2017-10-13 DIAGNOSIS — I10 Essential (primary) hypertension: Secondary | ICD-10-CM | POA: Diagnosis not present

## 2017-10-13 DIAGNOSIS — Z6835 Body mass index (BMI) 35.0-35.9, adult: Secondary | ICD-10-CM | POA: Diagnosis not present

## 2017-10-13 DIAGNOSIS — I2510 Atherosclerotic heart disease of native coronary artery without angina pectoris: Secondary | ICD-10-CM | POA: Diagnosis not present

## 2017-10-13 DIAGNOSIS — J069 Acute upper respiratory infection, unspecified: Secondary | ICD-10-CM | POA: Diagnosis not present

## 2017-10-13 DIAGNOSIS — R42 Dizziness and giddiness: Secondary | ICD-10-CM | POA: Diagnosis not present

## 2017-10-13 DIAGNOSIS — E7800 Pure hypercholesterolemia, unspecified: Secondary | ICD-10-CM | POA: Diagnosis not present

## 2017-10-13 DIAGNOSIS — I739 Peripheral vascular disease, unspecified: Secondary | ICD-10-CM | POA: Diagnosis not present

## 2017-10-13 DIAGNOSIS — E1165 Type 2 diabetes mellitus with hyperglycemia: Secondary | ICD-10-CM | POA: Diagnosis not present

## 2017-10-13 DIAGNOSIS — R350 Frequency of micturition: Secondary | ICD-10-CM | POA: Diagnosis not present

## 2017-10-13 DIAGNOSIS — Z299 Encounter for prophylactic measures, unspecified: Secondary | ICD-10-CM | POA: Diagnosis not present

## 2017-10-13 DIAGNOSIS — E119 Type 2 diabetes mellitus without complications: Secondary | ICD-10-CM | POA: Diagnosis not present

## 2017-10-13 DIAGNOSIS — Z789 Other specified health status: Secondary | ICD-10-CM | POA: Diagnosis not present

## 2017-10-13 DIAGNOSIS — C50919 Malignant neoplasm of unspecified site of unspecified female breast: Secondary | ICD-10-CM | POA: Diagnosis not present

## 2017-11-11 DIAGNOSIS — Z79899 Other long term (current) drug therapy: Secondary | ICD-10-CM | POA: Diagnosis not present

## 2017-11-11 DIAGNOSIS — E559 Vitamin D deficiency, unspecified: Secondary | ICD-10-CM | POA: Diagnosis not present

## 2017-11-11 DIAGNOSIS — D509 Iron deficiency anemia, unspecified: Secondary | ICD-10-CM | POA: Diagnosis not present

## 2017-11-11 DIAGNOSIS — R809 Proteinuria, unspecified: Secondary | ICD-10-CM | POA: Diagnosis not present

## 2017-11-11 DIAGNOSIS — N185 Chronic kidney disease, stage 5: Secondary | ICD-10-CM | POA: Diagnosis not present

## 2017-11-22 DIAGNOSIS — E7800 Pure hypercholesterolemia, unspecified: Secondary | ICD-10-CM | POA: Diagnosis not present

## 2017-11-22 DIAGNOSIS — I10 Essential (primary) hypertension: Secondary | ICD-10-CM | POA: Diagnosis not present

## 2017-11-22 DIAGNOSIS — I2510 Atherosclerotic heart disease of native coronary artery without angina pectoris: Secondary | ICD-10-CM | POA: Diagnosis not present

## 2017-11-22 DIAGNOSIS — E119 Type 2 diabetes mellitus without complications: Secondary | ICD-10-CM | POA: Diagnosis not present

## 2017-12-13 DIAGNOSIS — E7800 Pure hypercholesterolemia, unspecified: Secondary | ICD-10-CM | POA: Diagnosis not present

## 2017-12-13 DIAGNOSIS — I10 Essential (primary) hypertension: Secondary | ICD-10-CM | POA: Diagnosis not present

## 2017-12-13 DIAGNOSIS — I2510 Atherosclerotic heart disease of native coronary artery without angina pectoris: Secondary | ICD-10-CM | POA: Diagnosis not present

## 2017-12-13 DIAGNOSIS — E119 Type 2 diabetes mellitus without complications: Secondary | ICD-10-CM | POA: Diagnosis not present

## 2017-12-22 DIAGNOSIS — H43811 Vitreous degeneration, right eye: Secondary | ICD-10-CM | POA: Diagnosis not present

## 2018-01-13 DIAGNOSIS — M1712 Unilateral primary osteoarthritis, left knee: Secondary | ICD-10-CM | POA: Diagnosis not present

## 2018-01-18 DIAGNOSIS — I2510 Atherosclerotic heart disease of native coronary artery without angina pectoris: Secondary | ICD-10-CM | POA: Diagnosis not present

## 2018-01-18 DIAGNOSIS — I10 Essential (primary) hypertension: Secondary | ICD-10-CM | POA: Diagnosis not present

## 2018-01-18 DIAGNOSIS — E7800 Pure hypercholesterolemia, unspecified: Secondary | ICD-10-CM | POA: Diagnosis not present

## 2018-01-18 DIAGNOSIS — E119 Type 2 diabetes mellitus without complications: Secondary | ICD-10-CM | POA: Diagnosis not present

## 2018-01-25 DIAGNOSIS — C50919 Malignant neoplasm of unspecified site of unspecified female breast: Secondary | ICD-10-CM | POA: Diagnosis not present

## 2018-01-25 DIAGNOSIS — Z299 Encounter for prophylactic measures, unspecified: Secondary | ICD-10-CM | POA: Diagnosis not present

## 2018-01-25 DIAGNOSIS — M1710 Unilateral primary osteoarthritis, unspecified knee: Secondary | ICD-10-CM | POA: Diagnosis not present

## 2018-01-25 DIAGNOSIS — Z6835 Body mass index (BMI) 35.0-35.9, adult: Secondary | ICD-10-CM | POA: Diagnosis not present

## 2018-01-25 DIAGNOSIS — I739 Peripheral vascular disease, unspecified: Secondary | ICD-10-CM | POA: Diagnosis not present

## 2018-01-25 DIAGNOSIS — M549 Dorsalgia, unspecified: Secondary | ICD-10-CM | POA: Diagnosis not present

## 2018-01-25 DIAGNOSIS — E1165 Type 2 diabetes mellitus with hyperglycemia: Secondary | ICD-10-CM | POA: Diagnosis not present

## 2018-01-25 DIAGNOSIS — I10 Essential (primary) hypertension: Secondary | ICD-10-CM | POA: Diagnosis not present

## 2018-02-15 DIAGNOSIS — I2510 Atherosclerotic heart disease of native coronary artery without angina pectoris: Secondary | ICD-10-CM | POA: Diagnosis not present

## 2018-02-15 DIAGNOSIS — I10 Essential (primary) hypertension: Secondary | ICD-10-CM | POA: Diagnosis not present

## 2018-02-15 DIAGNOSIS — E7800 Pure hypercholesterolemia, unspecified: Secondary | ICD-10-CM | POA: Diagnosis not present

## 2018-02-15 DIAGNOSIS — E119 Type 2 diabetes mellitus without complications: Secondary | ICD-10-CM | POA: Diagnosis not present

## 2018-02-23 DIAGNOSIS — L280 Lichen simplex chronicus: Secondary | ICD-10-CM | POA: Diagnosis not present

## 2018-03-08 DIAGNOSIS — Z299 Encounter for prophylactic measures, unspecified: Secondary | ICD-10-CM | POA: Diagnosis not present

## 2018-03-08 DIAGNOSIS — Z6835 Body mass index (BMI) 35.0-35.9, adult: Secondary | ICD-10-CM | POA: Diagnosis not present

## 2018-03-08 DIAGNOSIS — C50919 Malignant neoplasm of unspecified site of unspecified female breast: Secondary | ICD-10-CM | POA: Diagnosis not present

## 2018-03-08 DIAGNOSIS — I10 Essential (primary) hypertension: Secondary | ICD-10-CM | POA: Diagnosis not present

## 2018-03-08 DIAGNOSIS — I739 Peripheral vascular disease, unspecified: Secondary | ICD-10-CM | POA: Diagnosis not present

## 2018-03-08 DIAGNOSIS — Z79899 Other long term (current) drug therapy: Secondary | ICD-10-CM | POA: Diagnosis not present

## 2018-03-08 DIAGNOSIS — E1165 Type 2 diabetes mellitus with hyperglycemia: Secondary | ICD-10-CM | POA: Diagnosis not present

## 2018-03-08 DIAGNOSIS — N185 Chronic kidney disease, stage 5: Secondary | ICD-10-CM | POA: Diagnosis not present

## 2018-03-08 DIAGNOSIS — D509 Iron deficiency anemia, unspecified: Secondary | ICD-10-CM | POA: Diagnosis not present

## 2018-03-08 DIAGNOSIS — E559 Vitamin D deficiency, unspecified: Secondary | ICD-10-CM | POA: Diagnosis not present

## 2018-03-08 DIAGNOSIS — R809 Proteinuria, unspecified: Secondary | ICD-10-CM | POA: Diagnosis not present

## 2018-03-14 DIAGNOSIS — M109 Gout, unspecified: Secondary | ICD-10-CM | POA: Diagnosis not present

## 2018-03-14 DIAGNOSIS — I5030 Unspecified diastolic (congestive) heart failure: Secondary | ICD-10-CM | POA: Diagnosis not present

## 2018-03-14 DIAGNOSIS — N182 Chronic kidney disease, stage 2 (mild): Secondary | ICD-10-CM | POA: Diagnosis not present

## 2018-03-14 DIAGNOSIS — I10 Essential (primary) hypertension: Secondary | ICD-10-CM | POA: Diagnosis not present

## 2018-03-14 DIAGNOSIS — D649 Anemia, unspecified: Secondary | ICD-10-CM | POA: Diagnosis not present

## 2018-04-21 DIAGNOSIS — N185 Chronic kidney disease, stage 5: Secondary | ICD-10-CM | POA: Diagnosis not present

## 2018-04-21 DIAGNOSIS — E559 Vitamin D deficiency, unspecified: Secondary | ICD-10-CM | POA: Diagnosis not present

## 2018-04-21 DIAGNOSIS — I10 Essential (primary) hypertension: Secondary | ICD-10-CM | POA: Diagnosis not present

## 2018-04-21 DIAGNOSIS — Z79899 Other long term (current) drug therapy: Secondary | ICD-10-CM | POA: Diagnosis not present

## 2018-04-21 DIAGNOSIS — D509 Iron deficiency anemia, unspecified: Secondary | ICD-10-CM | POA: Diagnosis not present

## 2018-04-21 DIAGNOSIS — R809 Proteinuria, unspecified: Secondary | ICD-10-CM | POA: Diagnosis not present

## 2018-04-21 DIAGNOSIS — E7800 Pure hypercholesterolemia, unspecified: Secondary | ICD-10-CM | POA: Diagnosis not present

## 2018-04-21 DIAGNOSIS — E119 Type 2 diabetes mellitus without complications: Secondary | ICD-10-CM | POA: Diagnosis not present

## 2018-04-21 DIAGNOSIS — I2510 Atherosclerotic heart disease of native coronary artery without angina pectoris: Secondary | ICD-10-CM | POA: Diagnosis not present

## 2018-04-25 DIAGNOSIS — I10 Essential (primary) hypertension: Secondary | ICD-10-CM | POA: Diagnosis not present

## 2018-04-25 DIAGNOSIS — E876 Hypokalemia: Secondary | ICD-10-CM | POA: Diagnosis not present

## 2018-04-25 DIAGNOSIS — N183 Chronic kidney disease, stage 3 (moderate): Secondary | ICD-10-CM | POA: Diagnosis not present

## 2018-04-25 DIAGNOSIS — E1129 Type 2 diabetes mellitus with other diabetic kidney complication: Secondary | ICD-10-CM | POA: Diagnosis not present

## 2018-04-25 DIAGNOSIS — D508 Other iron deficiency anemias: Secondary | ICD-10-CM | POA: Diagnosis not present

## 2018-05-16 DIAGNOSIS — M170 Bilateral primary osteoarthritis of knee: Secondary | ICD-10-CM | POA: Diagnosis not present

## 2018-05-26 DIAGNOSIS — E1165 Type 2 diabetes mellitus with hyperglycemia: Secondary | ICD-10-CM | POA: Diagnosis not present

## 2018-05-26 DIAGNOSIS — I10 Essential (primary) hypertension: Secondary | ICD-10-CM | POA: Diagnosis not present

## 2018-05-26 DIAGNOSIS — Z6836 Body mass index (BMI) 36.0-36.9, adult: Secondary | ICD-10-CM | POA: Diagnosis not present

## 2018-05-26 DIAGNOSIS — I739 Peripheral vascular disease, unspecified: Secondary | ICD-10-CM | POA: Diagnosis not present

## 2018-05-26 DIAGNOSIS — C50919 Malignant neoplasm of unspecified site of unspecified female breast: Secondary | ICD-10-CM | POA: Diagnosis not present

## 2018-05-26 DIAGNOSIS — Z299 Encounter for prophylactic measures, unspecified: Secondary | ICD-10-CM | POA: Diagnosis not present

## 2018-06-13 DIAGNOSIS — I10 Essential (primary) hypertension: Secondary | ICD-10-CM | POA: Diagnosis not present

## 2018-06-13 DIAGNOSIS — I739 Peripheral vascular disease, unspecified: Secondary | ICD-10-CM | POA: Diagnosis not present

## 2018-06-13 DIAGNOSIS — E1165 Type 2 diabetes mellitus with hyperglycemia: Secondary | ICD-10-CM | POA: Diagnosis not present

## 2018-06-13 DIAGNOSIS — Z6836 Body mass index (BMI) 36.0-36.9, adult: Secondary | ICD-10-CM | POA: Diagnosis not present

## 2018-06-13 DIAGNOSIS — Z299 Encounter for prophylactic measures, unspecified: Secondary | ICD-10-CM | POA: Diagnosis not present

## 2018-07-03 DIAGNOSIS — I2510 Atherosclerotic heart disease of native coronary artery without angina pectoris: Secondary | ICD-10-CM | POA: Diagnosis not present

## 2018-07-03 DIAGNOSIS — E7800 Pure hypercholesterolemia, unspecified: Secondary | ICD-10-CM | POA: Diagnosis not present

## 2018-07-03 DIAGNOSIS — E119 Type 2 diabetes mellitus without complications: Secondary | ICD-10-CM | POA: Diagnosis not present

## 2018-07-03 DIAGNOSIS — I10 Essential (primary) hypertension: Secondary | ICD-10-CM | POA: Diagnosis not present

## 2018-07-10 DIAGNOSIS — Z23 Encounter for immunization: Secondary | ICD-10-CM | POA: Diagnosis not present

## 2018-07-10 DIAGNOSIS — I10 Essential (primary) hypertension: Secondary | ICD-10-CM | POA: Diagnosis not present

## 2018-07-10 DIAGNOSIS — Z6835 Body mass index (BMI) 35.0-35.9, adult: Secondary | ICD-10-CM | POA: Diagnosis not present

## 2018-07-10 DIAGNOSIS — E1165 Type 2 diabetes mellitus with hyperglycemia: Secondary | ICD-10-CM | POA: Diagnosis not present

## 2018-07-10 DIAGNOSIS — Z299 Encounter for prophylactic measures, unspecified: Secondary | ICD-10-CM | POA: Diagnosis not present

## 2018-07-10 DIAGNOSIS — M47819 Spondylosis without myelopathy or radiculopathy, site unspecified: Secondary | ICD-10-CM | POA: Diagnosis not present

## 2018-07-20 DIAGNOSIS — N183 Chronic kidney disease, stage 3 (moderate): Secondary | ICD-10-CM | POA: Diagnosis not present

## 2018-07-20 DIAGNOSIS — E559 Vitamin D deficiency, unspecified: Secondary | ICD-10-CM | POA: Diagnosis not present

## 2018-07-20 DIAGNOSIS — Z79899 Other long term (current) drug therapy: Secondary | ICD-10-CM | POA: Diagnosis not present

## 2018-07-20 DIAGNOSIS — D509 Iron deficiency anemia, unspecified: Secondary | ICD-10-CM | POA: Diagnosis not present

## 2018-07-20 DIAGNOSIS — R809 Proteinuria, unspecified: Secondary | ICD-10-CM | POA: Diagnosis not present

## 2018-07-20 DIAGNOSIS — I129 Hypertensive chronic kidney disease with stage 1 through stage 4 chronic kidney disease, or unspecified chronic kidney disease: Secondary | ICD-10-CM | POA: Diagnosis not present

## 2018-07-25 DIAGNOSIS — Z6835 Body mass index (BMI) 35.0-35.9, adult: Secondary | ICD-10-CM | POA: Diagnosis not present

## 2018-07-25 DIAGNOSIS — C50919 Malignant neoplasm of unspecified site of unspecified female breast: Secondary | ICD-10-CM | POA: Diagnosis not present

## 2018-07-25 DIAGNOSIS — I509 Heart failure, unspecified: Secondary | ICD-10-CM | POA: Diagnosis not present

## 2018-07-25 DIAGNOSIS — E1165 Type 2 diabetes mellitus with hyperglycemia: Secondary | ICD-10-CM | POA: Diagnosis not present

## 2018-07-25 DIAGNOSIS — I10 Essential (primary) hypertension: Secondary | ICD-10-CM | POA: Diagnosis not present

## 2018-07-25 DIAGNOSIS — N184 Chronic kidney disease, stage 4 (severe): Secondary | ICD-10-CM | POA: Diagnosis not present

## 2018-07-25 DIAGNOSIS — Z299 Encounter for prophylactic measures, unspecified: Secondary | ICD-10-CM | POA: Diagnosis not present

## 2018-07-25 DIAGNOSIS — I739 Peripheral vascular disease, unspecified: Secondary | ICD-10-CM | POA: Diagnosis not present

## 2018-07-25 DIAGNOSIS — M549 Dorsalgia, unspecified: Secondary | ICD-10-CM | POA: Diagnosis not present

## 2018-07-28 DIAGNOSIS — I10 Essential (primary) hypertension: Secondary | ICD-10-CM | POA: Diagnosis not present

## 2018-07-28 DIAGNOSIS — M545 Low back pain: Secondary | ICD-10-CM | POA: Diagnosis not present

## 2018-07-28 DIAGNOSIS — E7800 Pure hypercholesterolemia, unspecified: Secondary | ICD-10-CM | POA: Diagnosis not present

## 2018-07-28 DIAGNOSIS — E119 Type 2 diabetes mellitus without complications: Secondary | ICD-10-CM | POA: Diagnosis not present

## 2018-07-28 DIAGNOSIS — M48061 Spinal stenosis, lumbar region without neurogenic claudication: Secondary | ICD-10-CM | POA: Diagnosis not present

## 2018-07-28 DIAGNOSIS — M47816 Spondylosis without myelopathy or radiculopathy, lumbar region: Secondary | ICD-10-CM | POA: Diagnosis not present

## 2018-07-28 DIAGNOSIS — M5410 Radiculopathy, site unspecified: Secondary | ICD-10-CM | POA: Diagnosis not present

## 2018-07-28 DIAGNOSIS — M4807 Spinal stenosis, lumbosacral region: Secondary | ICD-10-CM | POA: Diagnosis not present

## 2018-07-28 DIAGNOSIS — M47817 Spondylosis without myelopathy or radiculopathy, lumbosacral region: Secondary | ICD-10-CM | POA: Diagnosis not present

## 2018-07-28 DIAGNOSIS — I2510 Atherosclerotic heart disease of native coronary artery without angina pectoris: Secondary | ICD-10-CM | POA: Diagnosis not present

## 2018-08-01 DIAGNOSIS — Z6835 Body mass index (BMI) 35.0-35.9, adult: Secondary | ICD-10-CM | POA: Diagnosis not present

## 2018-08-01 DIAGNOSIS — M4312 Spondylolisthesis, cervical region: Secondary | ICD-10-CM | POA: Diagnosis not present

## 2018-08-01 DIAGNOSIS — M542 Cervicalgia: Secondary | ICD-10-CM | POA: Diagnosis not present

## 2018-08-01 DIAGNOSIS — Z299 Encounter for prophylactic measures, unspecified: Secondary | ICD-10-CM | POA: Diagnosis not present

## 2018-08-01 DIAGNOSIS — Z713 Dietary counseling and surveillance: Secondary | ICD-10-CM | POA: Diagnosis not present

## 2018-08-01 DIAGNOSIS — I10 Essential (primary) hypertension: Secondary | ICD-10-CM | POA: Diagnosis not present

## 2018-08-08 DIAGNOSIS — Z299 Encounter for prophylactic measures, unspecified: Secondary | ICD-10-CM | POA: Diagnosis not present

## 2018-08-08 DIAGNOSIS — C50919 Malignant neoplasm of unspecified site of unspecified female breast: Secondary | ICD-10-CM | POA: Diagnosis not present

## 2018-08-08 DIAGNOSIS — E1165 Type 2 diabetes mellitus with hyperglycemia: Secondary | ICD-10-CM | POA: Diagnosis not present

## 2018-08-08 DIAGNOSIS — Z6835 Body mass index (BMI) 35.0-35.9, adult: Secondary | ICD-10-CM | POA: Diagnosis not present

## 2018-08-08 DIAGNOSIS — I10 Essential (primary) hypertension: Secondary | ICD-10-CM | POA: Diagnosis not present

## 2018-08-08 DIAGNOSIS — I739 Peripheral vascular disease, unspecified: Secondary | ICD-10-CM | POA: Diagnosis not present

## 2018-08-08 DIAGNOSIS — M542 Cervicalgia: Secondary | ICD-10-CM | POA: Diagnosis not present

## 2018-08-25 DIAGNOSIS — E119 Type 2 diabetes mellitus without complications: Secondary | ICD-10-CM | POA: Diagnosis not present

## 2018-08-25 DIAGNOSIS — I2510 Atherosclerotic heart disease of native coronary artery without angina pectoris: Secondary | ICD-10-CM | POA: Diagnosis not present

## 2018-08-25 DIAGNOSIS — I10 Essential (primary) hypertension: Secondary | ICD-10-CM | POA: Diagnosis not present

## 2018-08-25 DIAGNOSIS — E7800 Pure hypercholesterolemia, unspecified: Secondary | ICD-10-CM | POA: Diagnosis not present

## 2018-09-04 DIAGNOSIS — M542 Cervicalgia: Secondary | ICD-10-CM | POA: Diagnosis not present

## 2018-09-04 DIAGNOSIS — I10 Essential (primary) hypertension: Secondary | ICD-10-CM | POA: Diagnosis not present

## 2018-09-04 DIAGNOSIS — Z6834 Body mass index (BMI) 34.0-34.9, adult: Secondary | ICD-10-CM | POA: Diagnosis not present

## 2018-09-04 DIAGNOSIS — Z299 Encounter for prophylactic measures, unspecified: Secondary | ICD-10-CM | POA: Diagnosis not present

## 2018-09-11 DIAGNOSIS — I10 Essential (primary) hypertension: Secondary | ICD-10-CM | POA: Diagnosis not present

## 2018-09-11 DIAGNOSIS — H6120 Impacted cerumen, unspecified ear: Secondary | ICD-10-CM | POA: Diagnosis not present

## 2018-09-11 DIAGNOSIS — Z299 Encounter for prophylactic measures, unspecified: Secondary | ICD-10-CM | POA: Diagnosis not present

## 2018-09-11 DIAGNOSIS — Z1231 Encounter for screening mammogram for malignant neoplasm of breast: Secondary | ICD-10-CM | POA: Diagnosis not present

## 2018-09-11 DIAGNOSIS — Z6834 Body mass index (BMI) 34.0-34.9, adult: Secondary | ICD-10-CM | POA: Diagnosis not present

## 2018-09-13 DIAGNOSIS — N6489 Other specified disorders of breast: Secondary | ICD-10-CM | POA: Diagnosis not present

## 2018-09-13 DIAGNOSIS — R922 Inconclusive mammogram: Secondary | ICD-10-CM | POA: Diagnosis not present

## 2018-09-15 DIAGNOSIS — I10 Essential (primary) hypertension: Secondary | ICD-10-CM | POA: Diagnosis not present

## 2018-09-15 DIAGNOSIS — E7800 Pure hypercholesterolemia, unspecified: Secondary | ICD-10-CM | POA: Diagnosis not present

## 2018-09-15 DIAGNOSIS — E119 Type 2 diabetes mellitus without complications: Secondary | ICD-10-CM | POA: Diagnosis not present

## 2018-09-15 DIAGNOSIS — I2510 Atherosclerotic heart disease of native coronary artery without angina pectoris: Secondary | ICD-10-CM | POA: Diagnosis not present

## 2018-09-27 DIAGNOSIS — Z1339 Encounter for screening examination for other mental health and behavioral disorders: Secondary | ICD-10-CM | POA: Diagnosis not present

## 2018-09-27 DIAGNOSIS — E7800 Pure hypercholesterolemia, unspecified: Secondary | ICD-10-CM | POA: Diagnosis not present

## 2018-09-27 DIAGNOSIS — Z79899 Other long term (current) drug therapy: Secondary | ICD-10-CM | POA: Diagnosis not present

## 2018-09-27 DIAGNOSIS — E559 Vitamin D deficiency, unspecified: Secondary | ICD-10-CM | POA: Diagnosis not present

## 2018-09-27 DIAGNOSIS — Z299 Encounter for prophylactic measures, unspecified: Secondary | ICD-10-CM | POA: Diagnosis not present

## 2018-09-27 DIAGNOSIS — Z1331 Encounter for screening for depression: Secondary | ICD-10-CM | POA: Diagnosis not present

## 2018-09-27 DIAGNOSIS — Z1211 Encounter for screening for malignant neoplasm of colon: Secondary | ICD-10-CM | POA: Diagnosis not present

## 2018-09-27 DIAGNOSIS — Z7189 Other specified counseling: Secondary | ICD-10-CM | POA: Diagnosis not present

## 2018-09-27 DIAGNOSIS — E1165 Type 2 diabetes mellitus with hyperglycemia: Secondary | ICD-10-CM | POA: Diagnosis not present

## 2018-09-27 DIAGNOSIS — Z0000 Encounter for general adult medical examination without abnormal findings: Secondary | ICD-10-CM | POA: Diagnosis not present

## 2018-09-27 DIAGNOSIS — R5383 Other fatigue: Secondary | ICD-10-CM | POA: Diagnosis not present

## 2018-09-27 DIAGNOSIS — Z6835 Body mass index (BMI) 35.0-35.9, adult: Secondary | ICD-10-CM | POA: Diagnosis not present

## 2018-10-10 DIAGNOSIS — I10 Essential (primary) hypertension: Secondary | ICD-10-CM | POA: Diagnosis not present

## 2018-10-10 DIAGNOSIS — M542 Cervicalgia: Secondary | ICD-10-CM | POA: Diagnosis not present

## 2018-10-10 DIAGNOSIS — Z299 Encounter for prophylactic measures, unspecified: Secondary | ICD-10-CM | POA: Diagnosis not present

## 2018-10-10 DIAGNOSIS — Z6835 Body mass index (BMI) 35.0-35.9, adult: Secondary | ICD-10-CM | POA: Diagnosis not present

## 2018-10-18 DIAGNOSIS — M542 Cervicalgia: Secondary | ICD-10-CM | POA: Diagnosis not present

## 2018-10-18 DIAGNOSIS — M47812 Spondylosis without myelopathy or radiculopathy, cervical region: Secondary | ICD-10-CM | POA: Diagnosis not present

## 2018-10-25 DIAGNOSIS — R51 Headache: Secondary | ICD-10-CM | POA: Diagnosis not present

## 2018-10-25 DIAGNOSIS — M542 Cervicalgia: Secondary | ICD-10-CM | POA: Diagnosis not present

## 2018-11-01 DIAGNOSIS — I2510 Atherosclerotic heart disease of native coronary artery without angina pectoris: Secondary | ICD-10-CM | POA: Diagnosis not present

## 2018-11-01 DIAGNOSIS — M542 Cervicalgia: Secondary | ICD-10-CM | POA: Diagnosis not present

## 2018-11-01 DIAGNOSIS — E119 Type 2 diabetes mellitus without complications: Secondary | ICD-10-CM | POA: Diagnosis not present

## 2018-11-01 DIAGNOSIS — I10 Essential (primary) hypertension: Secondary | ICD-10-CM | POA: Diagnosis not present

## 2018-11-01 DIAGNOSIS — E7800 Pure hypercholesterolemia, unspecified: Secondary | ICD-10-CM | POA: Diagnosis not present

## 2018-11-01 DIAGNOSIS — R51 Headache: Secondary | ICD-10-CM | POA: Diagnosis not present

## 2018-11-02 DIAGNOSIS — M542 Cervicalgia: Secondary | ICD-10-CM | POA: Diagnosis not present

## 2018-11-02 DIAGNOSIS — R51 Headache: Secondary | ICD-10-CM | POA: Diagnosis not present

## 2018-11-06 DIAGNOSIS — R51 Headache: Secondary | ICD-10-CM | POA: Diagnosis not present

## 2018-11-06 DIAGNOSIS — M542 Cervicalgia: Secondary | ICD-10-CM | POA: Diagnosis not present

## 2018-11-07 DIAGNOSIS — M542 Cervicalgia: Secondary | ICD-10-CM | POA: Diagnosis not present

## 2018-11-09 DIAGNOSIS — N184 Chronic kidney disease, stage 4 (severe): Secondary | ICD-10-CM | POA: Diagnosis not present

## 2018-11-09 DIAGNOSIS — Z79899 Other long term (current) drug therapy: Secondary | ICD-10-CM | POA: Diagnosis not present

## 2018-11-09 DIAGNOSIS — R51 Headache: Secondary | ICD-10-CM | POA: Diagnosis not present

## 2018-11-09 DIAGNOSIS — I129 Hypertensive chronic kidney disease with stage 1 through stage 4 chronic kidney disease, or unspecified chronic kidney disease: Secondary | ICD-10-CM | POA: Diagnosis not present

## 2018-11-09 DIAGNOSIS — R809 Proteinuria, unspecified: Secondary | ICD-10-CM | POA: Diagnosis not present

## 2018-11-09 DIAGNOSIS — D649 Anemia, unspecified: Secondary | ICD-10-CM | POA: Diagnosis not present

## 2018-11-09 DIAGNOSIS — I509 Heart failure, unspecified: Secondary | ICD-10-CM | POA: Diagnosis not present

## 2018-11-09 DIAGNOSIS — M542 Cervicalgia: Secondary | ICD-10-CM | POA: Diagnosis not present

## 2018-11-09 DIAGNOSIS — E876 Hypokalemia: Secondary | ICD-10-CM | POA: Diagnosis not present

## 2018-11-14 DIAGNOSIS — E2839 Other primary ovarian failure: Secondary | ICD-10-CM | POA: Diagnosis not present

## 2018-11-14 DIAGNOSIS — N182 Chronic kidney disease, stage 2 (mild): Secondary | ICD-10-CM | POA: Diagnosis not present

## 2018-11-14 DIAGNOSIS — I10 Essential (primary) hypertension: Secondary | ICD-10-CM | POA: Diagnosis not present

## 2018-11-15 DIAGNOSIS — M542 Cervicalgia: Secondary | ICD-10-CM | POA: Diagnosis not present

## 2018-11-21 DIAGNOSIS — M542 Cervicalgia: Secondary | ICD-10-CM | POA: Diagnosis not present

## 2018-11-28 DIAGNOSIS — I10 Essential (primary) hypertension: Secondary | ICD-10-CM | POA: Diagnosis not present

## 2018-11-28 DIAGNOSIS — Z6835 Body mass index (BMI) 35.0-35.9, adult: Secondary | ICD-10-CM | POA: Diagnosis not present

## 2018-11-28 DIAGNOSIS — I739 Peripheral vascular disease, unspecified: Secondary | ICD-10-CM | POA: Diagnosis not present

## 2018-11-28 DIAGNOSIS — Z789 Other specified health status: Secondary | ICD-10-CM | POA: Diagnosis not present

## 2018-11-28 DIAGNOSIS — E7800 Pure hypercholesterolemia, unspecified: Secondary | ICD-10-CM | POA: Diagnosis not present

## 2018-11-28 DIAGNOSIS — M25512 Pain in left shoulder: Secondary | ICD-10-CM | POA: Diagnosis not present

## 2018-11-28 DIAGNOSIS — Z299 Encounter for prophylactic measures, unspecified: Secondary | ICD-10-CM | POA: Diagnosis not present

## 2018-11-28 DIAGNOSIS — E1165 Type 2 diabetes mellitus with hyperglycemia: Secondary | ICD-10-CM | POA: Diagnosis not present

## 2018-11-29 DIAGNOSIS — E119 Type 2 diabetes mellitus without complications: Secondary | ICD-10-CM | POA: Diagnosis not present

## 2018-11-29 DIAGNOSIS — M779 Enthesopathy, unspecified: Secondary | ICD-10-CM | POA: Diagnosis not present

## 2018-11-29 DIAGNOSIS — I2510 Atherosclerotic heart disease of native coronary artery without angina pectoris: Secondary | ICD-10-CM | POA: Diagnosis not present

## 2018-11-29 DIAGNOSIS — M25512 Pain in left shoulder: Secondary | ICD-10-CM | POA: Diagnosis not present

## 2018-11-29 DIAGNOSIS — I10 Essential (primary) hypertension: Secondary | ICD-10-CM | POA: Diagnosis not present

## 2018-11-29 DIAGNOSIS — E7800 Pure hypercholesterolemia, unspecified: Secondary | ICD-10-CM | POA: Diagnosis not present

## 2018-12-05 DIAGNOSIS — I10 Essential (primary) hypertension: Secondary | ICD-10-CM | POA: Diagnosis not present

## 2018-12-05 DIAGNOSIS — Z6835 Body mass index (BMI) 35.0-35.9, adult: Secondary | ICD-10-CM | POA: Diagnosis not present

## 2018-12-05 DIAGNOSIS — M7582 Other shoulder lesions, left shoulder: Secondary | ICD-10-CM | POA: Diagnosis not present

## 2018-12-05 DIAGNOSIS — Z299 Encounter for prophylactic measures, unspecified: Secondary | ICD-10-CM | POA: Diagnosis not present

## 2018-12-26 DIAGNOSIS — I2510 Atherosclerotic heart disease of native coronary artery without angina pectoris: Secondary | ICD-10-CM | POA: Diagnosis not present

## 2018-12-26 DIAGNOSIS — E7800 Pure hypercholesterolemia, unspecified: Secondary | ICD-10-CM | POA: Diagnosis not present

## 2018-12-26 DIAGNOSIS — I10 Essential (primary) hypertension: Secondary | ICD-10-CM | POA: Diagnosis not present

## 2018-12-26 DIAGNOSIS — E119 Type 2 diabetes mellitus without complications: Secondary | ICD-10-CM | POA: Diagnosis not present

## 2019-01-24 DIAGNOSIS — Z7982 Long term (current) use of aspirin: Secondary | ICD-10-CM | POA: Diagnosis not present

## 2019-01-24 DIAGNOSIS — E7800 Pure hypercholesterolemia, unspecified: Secondary | ICD-10-CM | POA: Diagnosis not present

## 2019-01-24 DIAGNOSIS — Z853 Personal history of malignant neoplasm of breast: Secondary | ICD-10-CM | POA: Diagnosis not present

## 2019-01-24 DIAGNOSIS — E1165 Type 2 diabetes mellitus with hyperglycemia: Secondary | ICD-10-CM | POA: Diagnosis not present

## 2019-01-24 DIAGNOSIS — I10 Essential (primary) hypertension: Secondary | ICD-10-CM | POA: Diagnosis not present

## 2019-01-24 DIAGNOSIS — Z79899 Other long term (current) drug therapy: Secondary | ICD-10-CM | POA: Diagnosis not present

## 2019-01-24 DIAGNOSIS — Z794 Long term (current) use of insulin: Secondary | ICD-10-CM | POA: Diagnosis not present

## 2019-01-25 DIAGNOSIS — R1013 Epigastric pain: Secondary | ICD-10-CM | POA: Diagnosis not present

## 2019-01-25 DIAGNOSIS — Z713 Dietary counseling and surveillance: Secondary | ICD-10-CM | POA: Diagnosis not present

## 2019-01-25 DIAGNOSIS — R110 Nausea: Secondary | ICD-10-CM | POA: Diagnosis not present

## 2019-01-25 DIAGNOSIS — R1110 Vomiting, unspecified: Secondary | ICD-10-CM | POA: Diagnosis not present

## 2019-01-25 DIAGNOSIS — Z9049 Acquired absence of other specified parts of digestive tract: Secondary | ICD-10-CM | POA: Diagnosis not present

## 2019-01-25 DIAGNOSIS — Z6835 Body mass index (BMI) 35.0-35.9, adult: Secondary | ICD-10-CM | POA: Diagnosis not present

## 2019-01-25 DIAGNOSIS — I10 Essential (primary) hypertension: Secondary | ICD-10-CM | POA: Diagnosis not present

## 2019-01-25 DIAGNOSIS — Z299 Encounter for prophylactic measures, unspecified: Secondary | ICD-10-CM | POA: Diagnosis not present

## 2019-01-26 ENCOUNTER — Encounter: Payer: Self-pay | Admitting: Internal Medicine

## 2019-01-29 DIAGNOSIS — I10 Essential (primary) hypertension: Secondary | ICD-10-CM | POA: Diagnosis not present

## 2019-01-29 DIAGNOSIS — E7800 Pure hypercholesterolemia, unspecified: Secondary | ICD-10-CM | POA: Diagnosis not present

## 2019-01-29 DIAGNOSIS — E119 Type 2 diabetes mellitus without complications: Secondary | ICD-10-CM | POA: Diagnosis not present

## 2019-01-29 DIAGNOSIS — I2510 Atherosclerotic heart disease of native coronary artery without angina pectoris: Secondary | ICD-10-CM | POA: Diagnosis not present

## 2019-01-29 DIAGNOSIS — E1165 Type 2 diabetes mellitus with hyperglycemia: Secondary | ICD-10-CM | POA: Diagnosis not present

## 2019-01-29 DIAGNOSIS — R945 Abnormal results of liver function studies: Secondary | ICD-10-CM | POA: Diagnosis not present

## 2019-01-30 ENCOUNTER — Other Ambulatory Visit: Payer: Self-pay

## 2019-01-30 ENCOUNTER — Other Ambulatory Visit: Payer: Self-pay | Admitting: *Deleted

## 2019-01-30 ENCOUNTER — Telehealth: Payer: Self-pay | Admitting: *Deleted

## 2019-01-30 ENCOUNTER — Ambulatory Visit (INDEPENDENT_AMBULATORY_CARE_PROVIDER_SITE_OTHER): Payer: Medicare Other | Admitting: Gastroenterology

## 2019-01-30 ENCOUNTER — Encounter: Payer: Self-pay | Admitting: Gastroenterology

## 2019-01-30 VITALS — Ht 62.0 in | Wt 193.0 lb

## 2019-01-30 DIAGNOSIS — Z9049 Acquired absence of other specified parts of digestive tract: Secondary | ICD-10-CM | POA: Diagnosis not present

## 2019-01-30 DIAGNOSIS — R748 Abnormal levels of other serum enzymes: Secondary | ICD-10-CM

## 2019-01-30 DIAGNOSIS — R932 Abnormal findings on diagnostic imaging of liver and biliary tract: Secondary | ICD-10-CM

## 2019-01-30 NOTE — Telephone Encounter (Signed)
The orders are in for the stat labs, and the EUS/ERCP. [facility] closed, will call back and schedule in the morning.

## 2019-01-30 NOTE — Telephone Encounter (Signed)
-----  Message from Irving Copas., MD sent at 01/30/2019  3:32 PM EDT ----- Regarding: RE: ? ERCP When you schedule her for her case she should have the stat labs done in the hospital when she is getting prepared.  Please schedule her as an EUS/ERCP 2-hour block.  Thank you.GM ----- Message ----- From: Dalene Seltzer, RN Sent: 01/30/2019   3:23 PM EDT To: Irving Copas., MD Subject: RE: ? ERCP                                     Dr. Rush Landmark,   Just for clarification, do you want the patient to have STAT labs Wednesday morning prior to the procedure? Do you want the patient scheduled for ERCP or EUS? Please advise. ----- Message ----- From: Irving Copas., MD Sent: 01/30/2019   1:22 PM EDT To: Timothy Lasso, RN, Desiree Royston Bake, CMA, # Subject: RE: ? ERCP                                     [name],Thank you for reaching out.Her biliary dilation reported could be a result of just her postcholecystectomy state.Did you feel that the discomfort sounded biliary, I know you said postprandial?I think an ERCP is reasonable to consider but it may be worthwhile to think about an EUS upfront.What I can do is have her scheduled for next Wednesday and we can have stat hepatic function panel as well as amylase/lipase sent that morning to help guide me to pursue direct ERCP versus EUS/ERCP.[name] or covering RN please schedule this for next Wednesday at [facility]. Thank you.GM ----- Message ----- From: [place], MD Sent: 01/30/2019  10:55 AM EDT To: Wyline Beady, CMA, # Subject: ? ERCP                                         Consulted on Carol Keith today for  - epigastric pain that is improved, but persists, initially postprandial -abnormal liver enzymes: AST 425, ALT 282, alk phos 155, total bilirubin 1.0 -abnormal biliary tree on outside CT scan showing a common bile duct of 1.4 cm tapering within the pancreatic head to 1.2 cm without obvious obstructive  mass or stone (per report, no imaging) -Cholecystectomy for gallstones 1990s  I think she needs an ERCP.  Could I add her on to your schedule?  Thanks.  [name]

## 2019-01-30 NOTE — H&P (View-Only) (Signed)
TELEHEALTH VISIT  Referring Provider: Monico Blitz, MD Primary Care Physician:  Monico Blitz, MD   Tele-visit due to COVID-19 pandemic Patient requested visit virtually, consented to the virtual encounter via telephone Contact made at: 09:54 01/30/19 Patient verified by name and date of birth Location of patient: Home Location provider: [facility] Names of persons participating: Me, patient, Tinnie Gens CMA Time spent on telehealth visit:  28 minutes I discussed the limitations of evaluation and management by telemedicine. The patient expressed understanding and agreed to proceed.  Reason for Consultation: Epigastric pain, abnormal liver enzymes   IMPRESSION:  Postprandial epigastric pain Abnormal liver enzymes: AST 425, ALT 282, alk phos 155, total bilirubin 1.0 Abnormal biliary tree on CT scan showing a common bile duct of 1.4 cm tapering within the pancreatic head to 1.2 cm without obvious obstructive mass or stone Cholestyctomy for gallstones 1990s History of colon polyps    - per patient report on colonoscopy in [place] in 2018 Family history of colon cancer (sister in her 84s) BMI 35  Suspected choledocholithiasis given her acute symptoms, abnormal liver enzymes, and CT scan results. Differential also includes sludge, stricture, mass, and post-cholecystectomy changes with a separate acute hepatic process. I will review these results with one of our biliary endoscopists to arrange for endoscopic evaluation.   PLAN: Continue PPI and Carafate Obtain results from labs yesterday from [facility] ERCP versus EUS She would like to have her surveillance colonoscopy in [place]   I consented the patient discussing the risks, benefits, and alternatives to endoscopic evaluation. In particular, we discussed the risks that include, but are not limited to, reaction to medication, cardiopulmonary compromise, bleeding requiring blood transfusion, aspiration  resulting in pneumonia, perforation requiring surgery, pancreatitis that can result in prolonged hospitalization as well as multiorgan dysfunction, and even death. The patient acknowledges these risks and asks that we proceed.   HPI: Carol Keith is a 68 y.o. female seen in consultation at the request of Dr. Manuella Ghazi for further evaluation of abnormal liver enzymes and epigastric pain.  The history is obtained through the patient, review of her electronic health record, and review of records from Dr. Brigitte Pulse at [facility].  She has obesity with a BMI of 35, hypercholesterolemia, hypertension, vitamin D deficiency.  She has had a cholecystectomy.  Patient developed a constant, severe, burning and stabbing pain located in the epigastrium that developed 10-15 minutes after eating 01/24/19.  It radiates to the back.  Exacerbated by po intake, although this has now improved.  There was associated nausea not responding to antacids or H2 blocker therapy.  Protonix and Carafate added 01/25/19 and improved the nausea. Tolerating broth and soup. Ate a chicken biscuit and yogurt yesterday.  No other associated features.  No identified exacerbating or relieving features.  History of similar symptoms at the time of gallstones treated with cholcyestectomy in the 1990s.  Otherwise, no history of similar symptoms.   Labs 01/25/2019 show an AST 425, ALT 282, alk phos 155, total bilirubin 1.0, sodium 145, creatinine 1.10, glucose 269, albumin 4.4, amylase 93, lipase 62, WBC 7.2, hemoglobin 12.5, platelets 229  CT of the abdomen with contrast 01/25/2019 showed prior cholecystectomy, mild dilation of the intrahepatic ducts, the common bile duct measures up to 1.4 cm and tapers gradually within the pancreatic head to 1.2 cm.  There is no well-defined obstructive mass or stone.  No pancreatic duct dilatation or pancreatitis seen.  No pancreatic mass.   She had  a screening colonoscopy in 2018 in [place]. She  remembers there being a polyp. Repeat colonoscopy 2020. No prior upper endoscopy.   Sister with GERD. Sister with colon cancer at age 67.  No other known family history of colon cancer or polyps. No family history of uterine/endometrial cancer, pancreatic cancer or gastric/stomach cancer.  Past Medical History:  Diagnosis Date   Allergic rhinitis    Arthritis    kneees, back    Bursitis of right hip    Cancer ([place])    breast cancer   Cerumen impaction    Chronic kidney disease    appt. schedule for Dr. Lynnette Caffey, [id number]   Complication of anesthesia    Diabetes mellitus without complication ([place])    diagnosed x10 yrs. ago   Dizziness    Fatigue    Frequent urination    Heart murmur    Hypercholesteremia    Hypertension    Hypokalemia    Left foot pain    Left shoulder pain    Lumbago    Oral candida    Ovarian failure    Plantar fascial fibromatosis    PONV (postoperative nausea and vomiting)    when had hysterectomy yrs ago   Proteinuria    Right knee pain     Past Surgical History:  Procedure Laterality Date   ABDOMINAL HYSTERECTOMY     APPENDECTOMY     BREAST SURGERY Right    for Breast Ca, tx with surg. & chemo & radiation     CHOLECYSTECTOMY     KNEE ARTHROPLASTY Right 05/17/2016   Procedure: RIGHT TOTAL KNEE ARTHROPLASTY WITH COMPUTER NAVIGATION;  Surgeon: Rod Can, MD;  Location: [facility];  Service: Orthopedics;  Laterality: Right;  Needs RNFA   KNEE ARTHROPLASTY Left 01/24/2017   Procedure: LEFT TOTAL KNEE ARTHROPLASTY WITH COMPUTER NAVIGATION;  Surgeon: Rod Can, MD;  Location: [facility];  Service: Orthopedics;  Laterality: Left;  Dr. requesting RNFA   LAPAROSCOPY  1999   done at [facility], "something with separating my organs because things had grown together"   TONSILLECTOMY     TOTAL KNEE ARTHROPLASTY Left 01/24/2017   TUBAL LIGATION      Current Outpatient Medications  Medication Sig Dispense Refill   allopurinol  (ZYLOPRIM) 100 MG tablet Take 100 mg by mouth daily with supper.      amLODipine (NORVASC) 10 MG tablet Take 5 mg by mouth 2 (two) times daily.      aspirin 81 MG chewable tablet Chew 1 tablet (81 mg total) by mouth 2 (two) times daily. 60 tablet 1   Biotin 1000 MCG tablet Take 1,000 mcg by mouth every other day.     bumetanide (BUMEX) 1 MG tablet Take 1 mg by mouth daily.     Exenatide ER (BYDUREON BCISE) 2 MG/0.85ML AUIJ Inject into the skin once a week.     fluticasone (FLONASE) 50 MCG/ACT nasal spray Place into both nostrils daily.     glimepiride (AMARYL) 4 MG tablet Take 4 mg by mouth daily with breakfast.     glucose blood test strip 1 each by Other route as needed for other. Use as instructed     Insulin Glargine (TOUJEO SOLOSTAR) 300 UNIT/ML SOPN Inject 45 Units into the skin at bedtime.     Insulin Pen Needle (NOVOFINE) 32G X 6 MM MISC by Does not apply route.     losartan (COZAAR) 100 MG tablet Take 100 mg by mouth daily.     metoprolol succinate (TOPROL-XL)  100 MG 24 hr tablet Take 100 mg by mouth daily with breakfast. Take with or immediately following a meal.      ondansetron (ZOFRAN) 4 MG tablet Take 1 tablet (4 mg total) by mouth every 6 (six) hours as needed for nausea. 20 tablet 0   potassium chloride (K-DUR) 10 MEQ tablet Take 10 mEq by mouth 2 (two) times daily.      rosuvastatin (CRESTOR) 10 MG tablet Take 10 mg by mouth daily.     senna (SENOKOT) 8.6 MG TABS tablet Take 2 tablets (17.2 mg total) by mouth at bedtime. 120 each 0   sucralfate (CARAFATE) 1 g tablet Take 1 g by mouth 4 (four) times daily -  with meals and at bedtime.     No current facility-administered medications for this visit.     Allergies as of 01/30/2019 - Review Complete 01/30/2019  Allergen Reaction Noted   Ace inhibitors  01/30/2019   Actos [pioglitazone]  01/30/2019   Lipitor [atorvastatin]  01/30/2019   Metformin and related  01/30/2019    Family History  Problem  Relation Age of Onset   Breast cancer Sister        age 22    Social History   Socioeconomic History   Marital status: Married    Spouse name: Not on file   Number of children: Not on file   Years of education: Not on file   Highest education level: Not on file  Occupational History    Comment: [employer]  Social Needs   Financial resource strain: Not on file   Food insecurity:    Worry: Not on file    Inability: Not on file   Transportation needs:    Medical: Not on file    Non-medical: Not on file  Tobacco Use   Smoking status: Never Smoker   Smokeless tobacco: Never Used  Substance and Sexual Activity   Alcohol use: No   Drug use: No   Sexual activity: Not on file  Lifestyle   Physical activity:    Days per week: Not on file    Minutes per session: Not on file   Stress: Not on file  Relationships   Social connections:    Talks on phone: Not on file    Gets together: Not on file    Attends religious service: Not on file    Active member of club or organization: Not on file    Attends meetings of clubs or organizations: Not on file    Relationship status: Not on file   Intimate partner violence:    Fear of current or ex partner: Not on file    Emotionally abused: Not on file    Physically abused: Not on file    Forced sexual activity: Not on file  Other Topics Concern   Not on file  Social History Narrative   Not on file    Review of Systems: ALL ROS discussed and all others negative except listed in HPI.  Physical Exam: General: in no acute distress Neuro: Alert and appropriate Psych: Normal affect and normal insight   Jakson Delpilar L. Tarri Glenn, MD, MPH [facility] 01/30/2019, 9:11 AM

## 2019-01-30 NOTE — Progress Notes (Addendum)
TELEHEALTH VISIT  Referring Provider: Monico Blitz, MD Primary Care Physician:  Monico Blitz, MD   Tele-visit due to COVID-19 pandemic Patient requested visit virtually, consented to the virtual encounter via telephone Contact made at: 09:54 01/30/19 Patient verified by name and date of birth Location of patient: Home Location provider: [facility] Names of persons participating: Me, patient, Tinnie Gens CMA Time spent on telehealth visit:  28 minutes I discussed the limitations of evaluation and management by telemedicine. The patient expressed understanding and agreed to proceed.  Reason for Consultation: Epigastric pain, abnormal liver enzymes   IMPRESSION:  Postprandial epigastric pain Abnormal liver enzymes: AST 425, ALT 282, alk phos 155, total bilirubin 1.0 Abnormal biliary tree on CT scan showing a common bile duct of 1.4 cm tapering within the pancreatic head to 1.2 cm without obvious obstructive mass or stone Cholestyctomy for gallstones 1990s History of colon polyps    - per patient report on colonoscopy in [place] in 2018 Family history of colon cancer (sister in her 56s) BMI 35  Suspected choledocholithiasis given her acute symptoms, abnormal liver enzymes, and CT scan results. Differential also includes sludge, stricture, mass, and post-cholecystectomy changes with a separate acute hepatic process. I will review these results with one of our biliary endoscopists to arrange for endoscopic evaluation.   PLAN: Continue PPI and Carafate Obtain results from labs yesterday from [facility] ERCP versus EUS She would like to have her surveillance colonoscopy in [place]   I consented the patient discussing the risks, benefits, and alternatives to endoscopic evaluation. In particular, we discussed the risks that include, but are not limited to, reaction to medication, cardiopulmonary compromise, bleeding requiring blood transfusion, aspiration  resulting in pneumonia, perforation requiring surgery, pancreatitis that can result in prolonged hospitalization as well as multiorgan dysfunction, and even death. The patient acknowledges these risks and asks that we proceed.   HPI: Carol Keith is a 68 y.o. female seen in consultation at the request of Dr. Manuella Ghazi for further evaluation of abnormal liver enzymes and epigastric pain.  The history is obtained through the patient, review of her electronic health record, and review of records from Dr. Brigitte Pulse at [facility].  She has obesity with a BMI of 35, hypercholesterolemia, hypertension, vitamin D deficiency.  She has had a cholecystectomy.  Patient developed a constant, severe, burning and stabbing pain located in the epigastrium that developed 10-15 minutes after eating 01/24/19.  It radiates to the back.  Exacerbated by po intake, although this has now improved.  There was associated nausea not responding to antacids or H2 blocker therapy.  Protonix and Carafate added 01/25/19 and improved the nausea. Tolerating broth and soup. Ate a chicken biscuit and yogurt yesterday.  No other associated features.  No identified exacerbating or relieving features.  History of similar symptoms at the time of gallstones treated with cholcyestectomy in the 1990s.  Otherwise, no history of similar symptoms.   Labs 01/25/2019 show an AST 425, ALT 282, alk phos 155, total bilirubin 1.0, sodium 145, creatinine 1.10, glucose 269, albumin 4.4, amylase 93, lipase 62, WBC 7.2, hemoglobin 12.5, platelets 229  CT of the abdomen with contrast 01/25/2019 showed prior cholecystectomy, mild dilation of the intrahepatic ducts, the common bile duct measures up to 1.4 cm and tapers gradually within the pancreatic head to 1.2 cm.  There is no well-defined obstructive mass or stone.  No pancreatic duct dilatation or pancreatitis seen.  No pancreatic mass.   She had  a screening colonoscopy in 2018 in [place]. She  remembers there being a polyp. Repeat colonoscopy 2020. No prior upper endoscopy.   Sister with GERD. Sister with colon cancer at age 11.  No other known family history of colon cancer or polyps. No family history of uterine/endometrial cancer, pancreatic cancer or gastric/stomach cancer.  Past Medical History:  Diagnosis Date   Allergic rhinitis    Arthritis    kneees, back    Bursitis of right hip    Cancer ([place])    breast cancer   Cerumen impaction    Chronic kidney disease    appt. schedule for Dr. Lynnette Caffey, [id number]   Complication of anesthesia    Diabetes mellitus without complication ([place])    diagnosed x10 yrs. ago   Dizziness    Fatigue    Frequent urination    Heart murmur    Hypercholesteremia    Hypertension    Hypokalemia    Left foot pain    Left shoulder pain    Lumbago    Oral candida    Ovarian failure    Plantar fascial fibromatosis    PONV (postoperative nausea and vomiting)    when had hysterectomy yrs ago   Proteinuria    Right knee pain     Past Surgical History:  Procedure Laterality Date   ABDOMINAL HYSTERECTOMY     APPENDECTOMY     BREAST SURGERY Right    for Breast Ca, tx with surg. & chemo & radiation     CHOLECYSTECTOMY     KNEE ARTHROPLASTY Right 05/17/2016   Procedure: RIGHT TOTAL KNEE ARTHROPLASTY WITH COMPUTER NAVIGATION;  Surgeon: Rod Can, MD;  Location: [facility];  Service: Orthopedics;  Laterality: Right;  Needs RNFA   KNEE ARTHROPLASTY Left 01/24/2017   Procedure: LEFT TOTAL KNEE ARTHROPLASTY WITH COMPUTER NAVIGATION;  Surgeon: Rod Can, MD;  Location: [facility];  Service: Orthopedics;  Laterality: Left;  Dr. requesting RNFA   LAPAROSCOPY  1999   done at [facility], "something with separating my organs because things had grown together"   TONSILLECTOMY     TOTAL KNEE ARTHROPLASTY Left 01/24/2017   TUBAL LIGATION      Current Outpatient Medications  Medication Sig Dispense Refill   allopurinol  (ZYLOPRIM) 100 MG tablet Take 100 mg by mouth daily with supper.      amLODipine (NORVASC) 10 MG tablet Take 5 mg by mouth 2 (two) times daily.      aspirin 81 MG chewable tablet Chew 1 tablet (81 mg total) by mouth 2 (two) times daily. 60 tablet 1   Biotin 1000 MCG tablet Take 1,000 mcg by mouth every other day.     bumetanide (BUMEX) 1 MG tablet Take 1 mg by mouth daily.     Exenatide ER (BYDUREON BCISE) 2 MG/0.85ML AUIJ Inject into the skin once a week.     fluticasone (FLONASE) 50 MCG/ACT nasal spray Place into both nostrils daily.     glimepiride (AMARYL) 4 MG tablet Take 4 mg by mouth daily with breakfast.     glucose blood test strip 1 each by Other route as needed for other. Use as instructed     Insulin Glargine (TOUJEO SOLOSTAR) 300 UNIT/ML SOPN Inject 45 Units into the skin at bedtime.     Insulin Pen Needle (NOVOFINE) 32G X 6 MM MISC by Does not apply route.     losartan (COZAAR) 100 MG tablet Take 100 mg by mouth daily.     metoprolol succinate (TOPROL-XL)  100 MG 24 hr tablet Take 100 mg by mouth daily with breakfast. Take with or immediately following a meal.      ondansetron (ZOFRAN) 4 MG tablet Take 1 tablet (4 mg total) by mouth every 6 (six) hours as needed for nausea. 20 tablet 0   potassium chloride (K-DUR) 10 MEQ tablet Take 10 mEq by mouth 2 (two) times daily.      rosuvastatin (CRESTOR) 10 MG tablet Take 10 mg by mouth daily.     senna (SENOKOT) 8.6 MG TABS tablet Take 2 tablets (17.2 mg total) by mouth at bedtime. 120 each 0   sucralfate (CARAFATE) 1 g tablet Take 1 g by mouth 4 (four) times daily -  with meals and at bedtime.     No current facility-administered medications for this visit.     Allergies as of 01/30/2019 - Review Complete 01/30/2019  Allergen Reaction Noted   Ace inhibitors  01/30/2019   Actos [pioglitazone]  01/30/2019   Lipitor [atorvastatin]  01/30/2019   Metformin and related  01/30/2019    Family History  Problem  Relation Age of Onset   Breast cancer Sister        age 55    Social History   Socioeconomic History   Marital status: Married    Spouse name: Not on file   Number of children: Not on file   Years of education: Not on file   Highest education level: Not on file  Occupational History    Comment: [employer]  Social Needs   Financial resource strain: Not on file   Food insecurity:    Worry: Not on file    Inability: Not on file   Transportation needs:    Medical: Not on file    Non-medical: Not on file  Tobacco Use   Smoking status: Never Smoker   Smokeless tobacco: Never Used  Substance and Sexual Activity   Alcohol use: No   Drug use: No   Sexual activity: Not on file  Lifestyle   Physical activity:    Days per week: Not on file    Minutes per session: Not on file   Stress: Not on file  Relationships   Social connections:    Talks on phone: Not on file    Gets together: Not on file    Attends religious service: Not on file    Active member of club or organization: Not on file    Attends meetings of clubs or organizations: Not on file    Relationship status: Not on file   Intimate partner violence:    Fear of current or ex partner: Not on file    Emotionally abused: Not on file    Physically abused: Not on file    Forced sexual activity: Not on file  Other Topics Concern   Not on file  Social History Narrative   Not on file    Review of Systems: ALL ROS discussed and all others negative except listed in HPI.  Physical Exam: General: in no acute distress Neuro: Alert and appropriate Psych: Normal affect and normal insight   Alexiz Cothran L. Tarri Glenn, MD, MPH [facility] 01/30/2019, 9:11 AM

## 2019-01-30 NOTE — Patient Instructions (Addendum)
I have recommended an ERCP to evaluate your common bile duct and abnormal liver enzymes. We will be in contact to get this set up.   Please call with any questions or concerns in the meantime.   Thank you for your patience with me and our technology today! Please stay home, safe, and healthy. I look forward to meeting you in person in the future.

## 2019-01-31 DIAGNOSIS — E1165 Type 2 diabetes mellitus with hyperglycemia: Secondary | ICD-10-CM | POA: Diagnosis not present

## 2019-01-31 DIAGNOSIS — Z6825 Body mass index (BMI) 25.0-25.9, adult: Secondary | ICD-10-CM | POA: Diagnosis not present

## 2019-01-31 DIAGNOSIS — Z299 Encounter for prophylactic measures, unspecified: Secondary | ICD-10-CM | POA: Diagnosis not present

## 2019-01-31 DIAGNOSIS — I10 Essential (primary) hypertension: Secondary | ICD-10-CM | POA: Diagnosis not present

## 2019-01-31 DIAGNOSIS — R1084 Generalized abdominal pain: Secondary | ICD-10-CM | POA: Diagnosis not present

## 2019-01-31 DIAGNOSIS — K831 Obstruction of bile duct: Secondary | ICD-10-CM | POA: Diagnosis not present

## 2019-01-31 NOTE — Telephone Encounter (Signed)
EUS/ERCP scheduled at [facility] at 7:30 am. Will report to the patient to become NPO after midnight and to arrive at the hospital 1 hour prior to the procedure and to arrive with a family member that will remain the in the car but stay on hospital grounds. Neal Dy in [facility] that the patient needed stat labs to be drawn prior to the procedure. Orders in [place].  Left message for patient to call back.

## 2019-01-31 NOTE — Telephone Encounter (Signed)
Spoke with patient and gave instructions concerning the procedure that the patient is scheduled for on 02/07/19. The patient verbalized understanding. This RN told the patient to be at [facility] at 6:30 am and to remain NPO after midnight.

## 2019-01-31 NOTE — Telephone Encounter (Signed)
Thank you  for update

## 2019-02-01 ENCOUNTER — Emergency Department (HOSPITAL_COMMUNITY)
Admission: EM | Admit: 2019-02-01 | Discharge: 2019-02-01 | Disposition: A | Discharge status: Home / Self Care | Payer: Medicare Other | Attending: Emergency Medicine | Admitting: Emergency Medicine

## 2019-02-01 ENCOUNTER — Encounter (HOSPITAL_COMMUNITY): Payer: Self-pay | Admitting: Emergency Medicine

## 2019-02-01 ENCOUNTER — Other Ambulatory Visit: Payer: Self-pay

## 2019-02-01 DIAGNOSIS — Z853 Personal history of malignant neoplasm of breast: Secondary | ICD-10-CM | POA: Diagnosis not present

## 2019-02-01 DIAGNOSIS — R1013 Epigastric pain: Secondary | ICD-10-CM

## 2019-02-01 DIAGNOSIS — Z79899 Other long term (current) drug therapy: Secondary | ICD-10-CM | POA: Insufficient documentation

## 2019-02-01 DIAGNOSIS — I129 Hypertensive chronic kidney disease with stage 1 through stage 4 chronic kidney disease, or unspecified chronic kidney disease: Secondary | ICD-10-CM | POA: Diagnosis not present

## 2019-02-01 DIAGNOSIS — K838 Other specified diseases of biliary tract: Secondary | ICD-10-CM

## 2019-02-01 DIAGNOSIS — E1122 Type 2 diabetes mellitus with diabetic chronic kidney disease: Secondary | ICD-10-CM | POA: Insufficient documentation

## 2019-02-01 DIAGNOSIS — N189 Chronic kidney disease, unspecified: Secondary | ICD-10-CM | POA: Insufficient documentation

## 2019-02-01 DIAGNOSIS — Z7982 Long term (current) use of aspirin: Secondary | ICD-10-CM | POA: Insufficient documentation

## 2019-02-01 DIAGNOSIS — Z96653 Presence of artificial knee joint, bilateral: Secondary | ICD-10-CM | POA: Insufficient documentation

## 2019-02-01 DIAGNOSIS — R7989 Other specified abnormal findings of blood chemistry: Secondary | ICD-10-CM

## 2019-02-01 DIAGNOSIS — Z794 Long term (current) use of insulin: Secondary | ICD-10-CM | POA: Insufficient documentation

## 2019-02-01 DIAGNOSIS — I10 Essential (primary) hypertension: Secondary | ICD-10-CM | POA: Diagnosis not present

## 2019-02-01 DIAGNOSIS — R945 Abnormal results of liver function studies: Principal | ICD-10-CM

## 2019-02-01 LAB — COMPREHENSIVE METABOLIC PANEL
ALT: 251 U/L — ABNORMAL HIGH (ref 0–44)
AST: 227 U/L — ABNORMAL HIGH (ref 15–41)
Albumin: 3.8 g/dL (ref 3.5–5.0)
Alkaline Phosphatase: 241 U/L — ABNORMAL HIGH (ref 38–126)
Anion gap: 12 (ref 5–15)
BUN: 12 mg/dL (ref 8–23)
CO2: 24 mmol/L (ref 22–32)
Calcium: 10.1 mg/dL (ref 8.9–10.3)
Chloride: 105 mmol/L (ref 98–111)
Creatinine, Ser: 0.98 mg/dL (ref 0.44–1.00)
GFR calc Af Amer: >60 mL/min (ref >60)
GFR calc non Af Amer: 58 mL/min — ABNORMAL LOW (ref >60)
Glucose, Bld: 231 mg/dL — ABNORMAL HIGH (ref 70–99)
Potassium: 2.8 mmol/L — ABNORMAL LOW (ref 3.5–5.1)
Sodium: 141 mmol/L (ref 135–145)
Total Bilirubin: 2 mg/dL — ABNORMAL HIGH (ref 0.3–1.2)
Total Protein: 7.1 g/dL (ref 6.5–8.1)

## 2019-02-01 LAB — URINALYSIS, ROUTINE W REFLEX MICROSCOPIC
Bilirubin Urine: NEGATIVE
Glucose, UA: 50 mg/dL — AB
Hgb urine dipstick: NEGATIVE
Ketones, ur: NEGATIVE mg/dL
Leukocytes,Ua: NEGATIVE
Nitrite: NEGATIVE
Protein, ur: 30 mg/dL — AB
Specific Gravity, Urine: 1.009 (ref 1.005–1.030)
pH: 7 (ref 5.0–8.0)

## 2019-02-01 LAB — CBC WITH DIFFERENTIAL/PLATELET
Abs Immature Granulocytes: 0.03 10*3/uL (ref 0.00–0.07)
Basophils Absolute: 0 10*3/uL (ref 0.0–0.1)
Basophils Relative: 0 %
Eosinophils Absolute: 0.1 10*3/uL (ref 0.0–0.5)
Eosinophils Relative: 1 %
HCT: 37.3 % (ref 36.0–46.0)
Hemoglobin: 11.9 g/dL — ABNORMAL LOW (ref 12.0–15.0)
Immature Granulocytes: 0 %
Lymphocytes Relative: 28 %
Lymphs Abs: 2.1 10*3/uL (ref 0.7–4.0)
MCH: 28.7 pg (ref 26.0–34.0)
MCHC: 31.9 g/dL (ref 30.0–36.0)
MCV: 89.9 fL (ref 80.0–100.0)
Monocytes Absolute: 0.5 10*3/uL (ref 0.1–1.0)
Monocytes Relative: 6 %
Neutro Abs: 4.6 10*3/uL (ref 1.7–7.7)
Neutrophils Relative %: 65 %
Platelets: 201 10*3/uL (ref 150–400)
RBC: 4.15 MIL/uL (ref 3.87–5.11)
RDW: 13.9 % (ref 11.5–15.5)
WBC: 7.3 10*3/uL (ref 4.0–10.5)
nRBC: 0 % (ref 0.0–0.2)

## 2019-02-01 LAB — LIPASE, BLOOD: Lipase: 39 U/L (ref 11–51)

## 2019-02-01 MED ORDER — MORPHINE SULFATE (PF) 4 MG/ML IV SOLN
4.0000 mg | Freq: Once | INTRAVENOUS | Status: AC
  Administered 2019-02-01: 20:00:00 4 mg via INTRAVENOUS
  Filled 2019-02-01: qty 1

## 2019-02-01 MED ORDER — POTASSIUM CHLORIDE 10 MEQ/100ML IV SOLN
10.0000 meq | Freq: Once | INTRAVENOUS | Status: AC
  Administered 2019-02-01: 20:00:00 10 meq via INTRAVENOUS
  Filled 2019-02-01: qty 100

## 2019-02-01 MED ORDER — OXYCODONE-ACETAMINOPHEN 5-325 MG PO TABS: 1.0000 | ORAL_TABLET | Freq: Four times a day (QID) | ORAL | 0 refills | Status: AC | PRN

## 2019-02-01 MED ORDER — ONDANSETRON HCL 4 MG/2ML IJ SOLN
4.0000 mg | Freq: Once | INTRAMUSCULAR | Status: AC
  Administered 2019-02-01: 17:00:00 4 mg via INTRAVENOUS
  Filled 2019-02-01: qty 2

## 2019-02-01 MED ORDER — MORPHINE SULFATE (PF) 4 MG/ML IV SOLN
4.0000 mg | Freq: Once | INTRAVENOUS | Status: AC
  Administered 2019-02-01: 17:00:00 4 mg via INTRAVENOUS
  Filled 2019-02-01: qty 1

## 2019-02-01 NOTE — ED Triage Notes (Signed)
Patient c/o upper abdominal pain with nausea. States known obstruction with scheduled ERCP on 4/29. Denies vomiting. Last BM today.

## 2019-02-01 NOTE — Discharge Instructions (Signed)
You were seen in the ED today for worsening abdominal pain and nausea. Please take pain medication only as needed until your scheduled ERCP on 04/29. Continue to take your Carafate, Protonix, and Zofran. Return to the ED for any worsening symptoms.

## 2019-02-01 NOTE — ED Provider Notes (Signed)
[facility] DEPT Provider Note   CSN: [id number] Arrival date & time: 02/01/19  1621    History   Chief Complaint Chief Complaint  Patient presents with  . Abdominal Pain    HPI Carol Keith is a 68 y.o. female who presents to the ED with gradually worsening epigastric pain that began earlier today. Pt has been having epigastric pain x 1 week; she was evaluated at [facility] and found to have elevated liver enzymes with AST 425, ALT 282, alk phos 155, and total bili 1.0. CT A/P with findings of abnormal biliary tree and a common bile duct of 1.4 cm tapering within the pancreatic head to 1.2 cm without obvious obstructive mass or stone. Pt suspected to have choledocolithiasis and scheduled for ERCP/EUS on 04/29. Pt was prescribed Zofran, PPI, and Carafate for symptomatic relief. She reports taking those medications as prescribed but the pain was so severe today that she came to the ED as suggested by her GI doctor who is aware pt is here today. Pt also having nausea and diarrhea but no vomiting. Denies fever, chills, chest pain, shortness of breath, urinary symptoms.        Past Medical History:  Diagnosis Date  . Allergic rhinitis   . Arthritis    kneees, back   . Bursitis of right hip   . Cancer [facility])    breast cancer  . Cerumen impaction   . Chronic kidney disease    appt. schedule for Dr. Lynnette Caffey, [id number]  . Complication of anesthesia   . Diabetes mellitus without complication ([place])    diagnosed x10 yrs. ago  . Dizziness   . Fatigue   . Frequent urination   . Heart murmur   . Hypercholesteremia   . Hypertension   . Hypokalemia   . Left foot pain   . Left shoulder pain   . Lumbago   . Oral candida   . Ovarian failure   . Plantar fascial fibromatosis   . PONV (postoperative nausea and vomiting)    when had hysterectomy yrs ago  . Proteinuria   . Right knee pain     Patient Active Problem List   Diagnosis Date Noted  .  Osteoarthritis of left knee 01/24/2017    Past Surgical History:  Procedure Laterality Date  . ABDOMINAL HYSTERECTOMY    . APPENDECTOMY    . BREAST SURGERY Right    for Breast Ca, tx with surg. & chemo & radiation    . CHOLECYSTECTOMY    . KNEE ARTHROPLASTY Right 05/17/2016   Procedure: RIGHT TOTAL KNEE ARTHROPLASTY WITH COMPUTER NAVIGATION;  Surgeon: Rod Can, MD;  Location: [place];  Service: Orthopedics;  Laterality: Right;  Needs RNFA  . KNEE ARTHROPLASTY Left 01/24/2017   Procedure: LEFT TOTAL KNEE ARTHROPLASTY WITH COMPUTER NAVIGATION;  Surgeon: Rod Can, MD;  Location: [place];  Service: Orthopedics;  Laterality: Left;  Dr. requesting RNFA  . LAPAROSCOPY  1999   done at [facility], "something with separating my organs because things had grown together"  . TONSILLECTOMY    . TOTAL KNEE ARTHROPLASTY Left 01/24/2017  . TUBAL LIGATION       OB History   No obstetric history on file.      Home Medications    Prior to Admission medications   Medication Sig Start Date End Date Taking? Authorizing Provider  allopurinol (ZYLOPRIM) 100 MG tablet Take 100 mg by mouth daily with supper.     [provider]  amLODipine (NORVASC) 10 MG tablet Take 5 mg by mouth 2 (two) times daily.     [provider]  aspirin 81 MG chewable tablet Chew 1 tablet (81 mg total) by mouth 2 (two) times daily. 01/25/17   Swinteck, Aaron Edelman, MD  Biotin 1000 MCG tablet Take 1,000 mcg by mouth every other day.    [provider]  bumetanide (BUMEX) 1 MG tablet Take 1 mg by mouth daily.    [provider]  Exenatide ER (BYDUREON BCISE) 2 MG/0.85ML AUIJ Inject into the skin once a week.    [provider]  fluticasone (FLONASE) 50 MCG/ACT nasal spray Place into both nostrils daily.    [provider]  glimepiride (AMARYL) 4 MG tablet Take 4 mg by mouth daily with breakfast.    [provider]  glucose blood test strip 1 each by Other route as needed  for other. Use as instructed    [provider]  Insulin Glargine (TOUJEO SOLOSTAR) 300 UNIT/ML SOPN Inject 45 Units into the skin at bedtime.    [provider]  Insulin Pen Needle (NOVOFINE) 32G X 6 MM MISC by Does not apply route.    [provider]  losartan (COZAAR) 100 MG tablet Take 100 mg by mouth daily.    [provider]  metoprolol succinate (TOPROL-XL) 100 MG 24 hr tablet Take 100 mg by mouth daily with breakfast. Take with or immediately following a meal.     [provider]  ondansetron (ZOFRAN) 4 MG tablet Take 1 tablet (4 mg total) by mouth every 6 (six) hours as needed for nausea. 01/25/17   Swinteck, Aaron Edelman, MD  oxyCODONE-acetaminophen (PERCOCET/ROXICET) 5-325 MG tablet Take 1 tablet by mouth every 6 (six) hours as needed for up to 5 days for severe pain. 02/01/19 02/06/19  Alroy Bailiff, Mendy Chou, PA-C  potassium chloride (K-DUR) 10 MEQ tablet Take 10 mEq by mouth 2 (two) times daily.     [provider]  rosuvastatin (CRESTOR) 10 MG tablet Take 10 mg by mouth daily.    [provider]  senna (SENOKOT) 8.6 MG TABS tablet Take 2 tablets (17.2 mg total) by mouth at bedtime. 01/25/17   Swinteck, Aaron Edelman, MD  sucralfate (CARAFATE) 1 g tablet Take 1 g by mouth 4 (four) times daily -  with meals and at bedtime.    [provider]    Family History Family History  Problem Relation Age of Onset  . Breast cancer Sister        age 30    Social History Social History   Tobacco Use  . Smoking status: Never Smoker  . Smokeless tobacco: Never Used  Substance Use Topics  . Alcohol use: No  . Drug use: No     Allergies   Ace inhibitors; Actos [pioglitazone]; Lipitor [atorvastatin]; and Metformin and related   Review of Systems Review of Systems  Constitutional: Negative for chills and fever.  HENT: Negative for congestion.   Eyes: Negative for redness.  Respiratory: Negative for cough and shortness of breath.    Cardiovascular: Negative for chest pain.  Gastrointestinal: Positive for abdominal pain, diarrhea and nausea. Negative for blood in stool and vomiting.  Genitourinary: Negative for dysuria, flank pain and frequency.  Musculoskeletal: Negative for myalgias.  Skin: Negative for rash.     Physical Exam Updated Vital Signs BP (!) 163/80   Pulse 86   Temp 97.8 F (36.6 C) (Oral)   Resp 20   SpO2 99%  Physical Exam Vitals signs and nursing note reviewed.  Constitutional:      Appearance: She is not ill-appearing.  HENT:     Head: Normocephalic and atraumatic.  Eyes:     Conjunctiva/sclera: Conjunctivae normal.  Neck:     Musculoskeletal: Neck supple.  Cardiovascular:     Rate and Rhythm: Normal rate and regular rhythm.     Comments: 2+ radial and DP pulses bilaterally Pulmonary:     Effort: Pulmonary effort is normal.     Breath sounds: Normal breath sounds.  Abdominal:     General: Abdomen is flat. Bowel sounds are normal.     Palpations: Abdomen is soft.     Tenderness: There is abdominal tenderness in the epigastric area. There is no guarding or rebound.  Skin:    General: Skin is warm and dry.  Neurological:     Mental Status: She is alert.      ED Treatments / Results  Labs (all labs ordered are listed, but only abnormal results are displayed) Labs Reviewed  COMPREHENSIVE METABOLIC PANEL - Abnormal; Notable for the following components:      Result Value   Potassium 2.8 (*)    Glucose, Bld 231 (*)    AST 227 (*)    ALT 251 (*)    Alkaline Phosphatase 241 (*)    Total Bilirubin 2.0 (*)    GFR calc non Af Amer 58 (*)    All other components within normal limits  CBC WITH DIFFERENTIAL/PLATELET - Abnormal; Notable for the following components:   Hemoglobin 11.9 (*)    All other components within normal limits  URINALYSIS, ROUTINE W REFLEX MICROSCOPIC - Abnormal; Notable for the following components:   APPearance HAZY (*)    Glucose, UA 50 (*)    Protein,  ur 30 (*)    Bacteria, UA RARE (*)    All other components within normal limits  LIPASE, BLOOD    EKG EKG Interpretation  Date/Time:  Thursday February 01 2019 19:49:28 EDT Ventricular Rate:  82 PR Interval:    QRS Duration: 95 QT Interval:  398 QTC Calculation: 465 R Axis:   30 Text Interpretation:  Sinus rhythm Borderline T wave abnormalities No significant change since last tracing Confirmed by Dorie Rank [phone]) on 02/01/2019 7:52:46 PM   Radiology No results found.  Procedures Procedures (including critical care time)  Medications Ordered in ED Medications  morphine 4 MG/ML injection 4 mg (4 mg Intravenous Given 02/01/19 1729)  ondansetron (ZOFRAN) injection 4 mg (4 mg Intravenous Given 02/01/19 1729)  potassium chloride 10 mEq in 100 mL IVPB (0 mEq Intravenous Stopped 02/01/19 2104)  morphine 4 MG/ML injection 4 mg (4 mg Intravenous Given 02/01/19 1951)     Initial Impression / Assessment and Plan / ED Course  I have reviewed the triage vital signs and the nursing notes.  Pertinent labs & imaging results that were available during my care of the patient were reviewed by me and considered in my medical decision making (see chart for details).    Pt is a 68 year old female who presents to the ED with epigastric pain and nausea. She has an ERCP/EUS scheduled for 4/29 with [facility] for suspected choledocolithiasis. Was told to come to the ED if her pain got any worse. Will get baseline labs today as well as treat pain with pain meds/antiemetics and touch base with GI prior to likely discharge home.   Labs as expected with elevated AST, ALT, Alk  phos, and total bili for a patient with suspected choledocolithiasis. Hypokalemia present at 2.8; pt reports she usually has hypokalemia due to the Bumex she takes at home. Will replete K in the ED. Pt does take KDUR at home. All other labs unremarkable at this time; no leukocytosis to suggest infectious process. Lipase within normal  limits as well. Pt received a total of 8 mg of morphine as well as 4 mg Zofran during her visit; she reports the pain has completely subsided with second dose of morphine.   Dr. Tomi Bamberger discussed case with Dr. Tarri Glenn with [facility] who reports that she can try to squeeze patient into the surgery schedule tomorrow if she would like to be admitted overnight although there is no guarantee. Discussed case with patient who would like to take her chances with surgery as scheduled on 04/29 as she is on the schedule for that day. Will send home with pain medication. Pt has zofran at home as well as her protonix and carafate; advised to continue taking.        Final Clinical Impressions(s) / ED Diagnoses   Final diagnoses:  Epigastric pain    ED Discharge Orders         Ordered    oxyCODONE-acetaminophen (PERCOCET/ROXICET) 5-325 MG tablet  Every 6 hours PRN     02/01/19 2125           Eustaquio Maize, PA-C 02/01/19 2208    Dorie Rank, MD 02/01/19 2337

## 2019-02-02 DIAGNOSIS — K831 Obstruction of bile duct: Secondary | ICD-10-CM | POA: Diagnosis not present

## 2019-02-05 ENCOUNTER — Telehealth: Payer: Self-pay

## 2019-02-05 NOTE — Telephone Encounter (Signed)
The patient has been notified of this information and all questions answered. The pt has been advised of the information and verbalized understanding.    

## 2019-02-05 NOTE — Telephone Encounter (Signed)
-----   Message from Irving Copas., MD sent at 02/05/2019 11:18 AM EDT ----- Regarding: Upcoming procedure Dear Chong Sicilian or Covering RN, Can you make sure that the patient gets an extra dose of 40 mEq of Potassium Monday/Tuesday. If her Potassium is under 3 on day or procedure she will be postponed most likely.   Please call to ensure this is done. When she comes for her procedure we are going to be doing repeat labs. Thanks. Chester Holstein

## 2019-02-06 ENCOUNTER — Encounter (HOSPITAL_COMMUNITY): Payer: Self-pay | Admitting: *Deleted

## 2019-02-06 ENCOUNTER — Other Ambulatory Visit: Payer: Self-pay

## 2019-02-06 NOTE — Progress Notes (Signed)
Anesthesia Chart Review: SAME DAY WORK-UP (ENDO):  Case:  [id number] Date/Time:  02/07/19 0730   Procedures:      ENDOSCOPIC RETROGRADE CHOLANGIOPANCREATOGRAPHY (ERCP) (N/A )     ESOPHAGEAL ENDOSCOPIC ULTRASOUND (EUS) RADIAL (N/A )   Anesthesia type:  General   Pre-op diagnosis:      Elevated lfts     Biliary dilation   Location:  [facility] ROOM 1 / [place] [facility] [facility]   Surgeon:  Mansouraty, Telford Nab., MD      DISCUSSION: Patient is a 68 year old scheduled for the above procedure. ED notes from 02/01/19 indicate patient presented with worsening epigastric pain in the setting of recent elevated LFTs and finding of abnormal biliary tree on CT imaging ("common bile duct of 1.4 cm tapering within the pancreatic head to 1.2 cm without obvious obstructive mass or stone") with prior cholecystectomy. AST/ALT in the 200's, but a little improved since previous documented lab results; however, total bilirubin up to 2.0. K 2.8, but was supplemented. She was already scheduled for EUS/ERCP, so she opted to return home with medication management until her scheduled procedure. Dr. Rush Landmark is also having her take additional KCL Monday and Tuesday this week with plans to repeat labs prior to procedure.   Other history includes  HTN, heart murmur (mild TR 2016), DM2, CKD stage III, right breast cancer (s/p surgery), post-op N/V. Never smoker.   Anesthesia team to evaluate on the day of her procedure. Labs per GI, but will at least need a repeat K.    PROVIDERS: Monico Blitz, MD is PCP Fran Lowes, MD is nephrologist   LABS:  Lab Results  Component Value Date   WBC 7.3 02/01/2019   HGB 11.9 (L) 02/01/2019   HCT 37.3 02/01/2019   PLT 201 02/01/2019   GLUCOSE 231 (H) 02/01/2019   ALT 251 (H) 02/01/2019   AST 227 (H) 02/01/2019   NA 141 02/01/2019   K 2.8 (L) 02/01/2019   CL 105 02/01/2019   CREATININE 0.98 02/01/2019   BUN 12 02/01/2019   CO2 24 02/01/2019   - Per 01/30/19 GI note by Thornton Park, MD, recent labs showed "Abnormal liver enzymes: AST 425, ALT 282, alk phos 155, total bilirubin 1.0".   EKG: 02/01/19:  Sinus rhythm Borderline T wave abnormalities No significant change since last tracing Confirmed by Dorie Rank [phone]) on 02/01/2019 7:52:46 PM Also confirmed by Dorie Rank [phone]), editor Hattie Perch [phone]) on 02/02/2019 12:03:14 PM   CV: Echo 09/01/15 ([facility], scanned under Media tab, Correspondence, 01/24/17): Conclusions: The LV is normal in size and shape. The LV EF is estimated at 50-55%. The LV diastolic function is reduced. Consider diastolic dysfunction. Mild TR.   Past Medical History:  Diagnosis Date  . Allergic rhinitis   . Arthritis    kneees, back   . Bursitis of right hip   . Cancer [facility])    breast cancer  . Cerumen impaction   . Chronic kidney disease    appt. schedule for Dr. Lynnette Caffey, [id number]  . Complication of anesthesia   . Diabetes mellitus without complication ([place])    Type II  . Dizziness   . Fatigue   . Frequent urination   . Gout   . Heart murmur   . Hypercholesteremia   . Hypertension   . Hypokalemia   . Left foot pain   . Left shoulder pain   . Lumbago   . Oral candida   . Ovarian failure   .  Plantar fascial fibromatosis   . PONV (postoperative nausea and vomiting)   . Proteinuria   . Right knee pain     Past Surgical History:  Procedure Laterality Date  . ABDOMINAL HYSTERECTOMY    . APPENDECTOMY    . BREAST SURGERY Right    for Breast Ca, tx with surg. & chemo & radiation    . CHOLECYSTECTOMY    . COLONOSCOPY    . KNEE ARTHROPLASTY Right 05/17/2016   Procedure: RIGHT TOTAL KNEE ARTHROPLASTY WITH COMPUTER NAVIGATION;  Surgeon: Rod Can, MD;  Location: [place];  Service: Orthopedics;  Laterality: Right;  Needs RNFA  . KNEE ARTHROPLASTY Left 01/24/2017   Procedure: LEFT TOTAL KNEE ARTHROPLASTY WITH COMPUTER NAVIGATION;  Surgeon: Rod Can, MD;  Location: [place];  Service: Orthopedics;   Laterality: Left;  Dr. requesting RNFA  . LAPAROSCOPY  1999   done at [facility] "something with separating my organs because things had grown together"  . TONSILLECTOMY    . TUBAL LIGATION      MEDICATIONS: No current facility-administered medications for this encounter.    Marland Kitchen allopurinol (ZYLOPRIM) 100 MG tablet  . amLODipine (NORVASC) 5 MG tablet  . aspirin EC 81 MG tablet  . bumetanide (BUMEX) 1 MG tablet  . Exenatide ER (BYDUREON BCISE) 2 MG/0.85ML AUIJ  . fluticasone (FLONASE) 50 MCG/ACT nasal spray  . glimepiride (AMARYL) 2 MG tablet  . losartan (COZAAR) 100 MG tablet  . metoprolol succinate (TOPROL-XL) 100 MG 24 hr tablet  . ondansetron (ZOFRAN) 4 MG tablet  . oxyCODONE-acetaminophen (PERCOCET/ROXICET) 5-325 MG tablet  . pantoprazole (PROTONIX) 40 MG tablet  . Polyethyl Glycol-Propyl Glycol (LUBRICANT EYE DROPS) 0.4-0.3 % SOLN  . polyethylene glycol (MIRALAX / GLYCOLAX) 17 g packet  . potassium chloride (K-DUR) 10 MEQ tablet  . rosuvastatin (CRESTOR) 10 MG tablet  . sucralfate (CARAFATE) 1 g tablet  . TOUJEO SOLOSTAR 300 UNIT/ML SOPN  . glucose blood test strip  . Insulin Pen Needle (NOVOFINE) 32G X 6 MM MISC  . oxyCODONE-acetaminophen (PERCOCET/ROXICET) 5-325 MG tablet    Myra Gianotti, PA-C Surgical Short Stay/Anesthesiology [facility] Phone [phone] [facility] Phone [phone] 02/06/2019 3:21 PM

## 2019-02-06 NOTE — Progress Notes (Addendum)
Carol Keith denies chest pain or shortness of breath. Patient has type II diabetes, she reports that Fasting was 144 this am.  I instructed patient to take 12/ of [place]je[place], 22 units tonight and to hold diabetic meds in am. I instructed patient to check CBG after awaking and every 2 hours until arrival  to the hospital.  I Instructed patient if CBG is less than 70 to drink 1/2 cup of a clear juice or gingerale.. Recheck CBG in 15 minutes.  Patient denies that she or her family has experienced any of the following: Cough Fever >100.4 Runny Nose Sore Throat Difficulty breathing/ shortness of breath Travel in past 14 days- no

## 2019-02-06 NOTE — Anesthesia Preprocedure Evaluation (Addendum)
Anesthesia Evaluation    History of Anesthesia Complications (+) PONV  Airway Mallampati: II  TM Distance: >3 FB Neck ROM: Full    Dental no notable dental hx.    Pulmonary    Pulmonary exam normal breath sounds clear to auscultation       Cardiovascular hypertension, Pt. on medications Normal cardiovascular exam Rhythm:Regular Rate:Normal     Neuro/Psych    GI/Hepatic   Endo/Other  diabetes  Renal/GU      Musculoskeletal  (+) Arthritis , Osteoarthritis,    Abdominal   Peds  Hematology   Anesthesia Other Findings   Reproductive/Obstetrics                                                             Anesthesia Evaluation  Patient identified by MRN, date of birth, ID band Patient awake    Reviewed: Allergy & Precautions, NPO status , Patient's Chart, lab work & pertinent test results  History of Anesthesia Complications (+) PONV and history of anesthetic complications  Airway Mallampati: II  TM Distance: >3 FB Neck ROM: Full    Dental no notable dental hx. (+) Dental Advisory Given   Pulmonary neg pulmonary ROS, neg shortness of breath, neg sleep apnea, neg COPD, neg recent URI,    Pulmonary exam normal breath sounds clear to auscultation       Cardiovascular hypertension, Pt. on medications and Pt. on home beta blockers (-) angina(-) Past MI, (-) Cardiac Stents and (-) Orthopnea Normal cardiovascular exam(-) pacemaker+ Valvular Problems/Murmurs  - Systolic murmurs    Neuro/Psych neg Seizures negative neurological ROS  negative psych ROS   GI/Hepatic negative GI ROS, Neg liver ROS,   Endo/Other  diabetes, Well Controlled, Type 2, Insulin Dependent, Oral Hypoglycemic Agents  Renal/GU Renal InsufficiencyRenal disease     Musculoskeletal  (+) Arthritis ,   Abdominal (+) - obese,   Peds  Hematology   Anesthesia Other Findings H/o breast cancer  Reproductive/Obstetrics                            Anesthesia Physical  Anesthesia Plan  ASA: III  Anesthesia Plan: General   Post-op Pain Management: GA combined w/ Regional for post-op pain   Induction: Intravenous  Airway Management Planned: LMA  Additional Equipment:   Intra-op Plan:   Post-operative Plan: Extubation in OR  Informed Consent: I have reviewed the patients History and Physical, chart, labs and discussed the procedure including the risks, benefits and alternatives for the proposed anesthesia with the patient or authorized representative who has indicated his/her understanding and acceptance.   Dental advisory given  Plan Discussed with: Anesthesiologist  Anesthesia Plan Comments:        Anesthesia Quick Evaluation  Anesthesia Physical Anesthesia Plan  ASA: III  Anesthesia Plan: General   Post-op Pain Management:    Induction: Intravenous, Rapid sequence and Cricoid pressure planned  PONV Risk Score and Plan: 4 or greater and Ondansetron, Dexamethasone, Midazolam, Scopolamine patch - Pre-op and Treatment may vary due to age or medical condition  Airway Management Planned: Oral ETT  Additional Equipment:   Intra-op Plan:   Post-operative Plan: Extubation in OR  Informed Consent: I have reviewed the patients History and Physical, chart, labs and discussed the  procedure including the risks, benefits and alternatives for the proposed anesthesia with the patient or authorized representative who has indicated his/her understanding and acceptance.     Dental advisory given  Plan Discussed with: CRNA  Anesthesia Plan Comments: (PAT note written 02/06/2019 by Myra Gianotti, PA-C. K 2.8 02/01/19, supplemented. )       Anesthesia Quick Evaluation

## 2019-02-07 ENCOUNTER — Ambulatory Visit (HOSPITAL_COMMUNITY)
Admission: RE | Admit: 2019-02-07 | Discharge: 2019-02-07 | Disposition: A | Discharge status: Home / Self Care | Payer: Medicare Other | Attending: Gastroenterology | Admitting: Gastroenterology

## 2019-02-07 ENCOUNTER — Encounter (HOSPITAL_COMMUNITY): Payer: Self-pay | Admitting: *Deleted

## 2019-02-07 ENCOUNTER — Ambulatory Visit (HOSPITAL_COMMUNITY): Payer: Medicare Other | Admitting: Vascular Surgery

## 2019-02-07 ENCOUNTER — Encounter (HOSPITAL_COMMUNITY)
Admission: RE | Disposition: A | Discharge status: Home / Self Care | Payer: Self-pay | Source: Home / Self Care | Attending: Gastroenterology

## 2019-02-07 ENCOUNTER — Ambulatory Visit (HOSPITAL_COMMUNITY): Payer: Medicare Other

## 2019-02-07 ENCOUNTER — Other Ambulatory Visit: Payer: Self-pay | Admitting: Emergency Medicine

## 2019-02-07 DIAGNOSIS — M109 Gout, unspecified: Secondary | ICD-10-CM | POA: Insufficient documentation

## 2019-02-07 DIAGNOSIS — K2950 Unspecified chronic gastritis without bleeding: Secondary | ICD-10-CM | POA: Insufficient documentation

## 2019-02-07 DIAGNOSIS — K3189 Other diseases of stomach and duodenum: Secondary | ICD-10-CM | POA: Diagnosis not present

## 2019-02-07 DIAGNOSIS — Z794 Long term (current) use of insulin: Secondary | ICD-10-CM | POA: Diagnosis not present

## 2019-02-07 DIAGNOSIS — K869 Disease of pancreas, unspecified: Secondary | ICD-10-CM

## 2019-02-07 DIAGNOSIS — Z96653 Presence of artificial knee joint, bilateral: Secondary | ICD-10-CM | POA: Diagnosis not present

## 2019-02-07 DIAGNOSIS — R945 Abnormal results of liver function studies: Principal | ICD-10-CM

## 2019-02-07 DIAGNOSIS — K838 Other specified diseases of biliary tract: Secondary | ICD-10-CM | POA: Diagnosis not present

## 2019-02-07 DIAGNOSIS — R932 Abnormal findings on diagnostic imaging of liver and biliary tract: Secondary | ICD-10-CM | POA: Diagnosis not present

## 2019-02-07 DIAGNOSIS — Z7982 Long term (current) use of aspirin: Secondary | ICD-10-CM | POA: Insufficient documentation

## 2019-02-07 DIAGNOSIS — Z800 Family history of malignant neoplasm of digestive organs: Secondary | ICD-10-CM | POA: Insufficient documentation

## 2019-02-07 DIAGNOSIS — N183 Chronic kidney disease, stage 3 (moderate): Secondary | ICD-10-CM | POA: Insufficient documentation

## 2019-02-07 DIAGNOSIS — K839 Disease of biliary tract, unspecified: Secondary | ICD-10-CM | POA: Diagnosis not present

## 2019-02-07 DIAGNOSIS — E7800 Pure hypercholesterolemia, unspecified: Secondary | ICD-10-CM | POA: Insufficient documentation

## 2019-02-07 DIAGNOSIS — E669 Obesity, unspecified: Secondary | ICD-10-CM | POA: Diagnosis not present

## 2019-02-07 DIAGNOSIS — Z853 Personal history of malignant neoplasm of breast: Secondary | ICD-10-CM | POA: Diagnosis not present

## 2019-02-07 DIAGNOSIS — M1990 Unspecified osteoarthritis, unspecified site: Secondary | ICD-10-CM | POA: Insufficient documentation

## 2019-02-07 DIAGNOSIS — R7989 Other specified abnormal findings of blood chemistry: Secondary | ICD-10-CM

## 2019-02-07 DIAGNOSIS — Q399 Congenital malformation of esophagus, unspecified: Secondary | ICD-10-CM | POA: Insufficient documentation

## 2019-02-07 DIAGNOSIS — Z79899 Other long term (current) drug therapy: Secondary | ICD-10-CM | POA: Insufficient documentation

## 2019-02-07 DIAGNOSIS — K831 Obstruction of bile duct: Secondary | ICD-10-CM | POA: Insufficient documentation

## 2019-02-07 DIAGNOSIS — Z888 Allergy status to other drugs, medicaments and biological substances status: Secondary | ICD-10-CM | POA: Diagnosis not present

## 2019-02-07 DIAGNOSIS — I129 Hypertensive chronic kidney disease with stage 1 through stage 4 chronic kidney disease, or unspecified chronic kidney disease: Secondary | ICD-10-CM | POA: Diagnosis not present

## 2019-02-07 DIAGNOSIS — R1011 Right upper quadrant pain: Secondary | ICD-10-CM | POA: Insufficient documentation

## 2019-02-07 DIAGNOSIS — I899 Noninfective disorder of lymphatic vessels and lymph nodes, unspecified: Secondary | ICD-10-CM

## 2019-02-07 DIAGNOSIS — E1122 Type 2 diabetes mellitus with diabetic chronic kidney disease: Secondary | ICD-10-CM | POA: Diagnosis not present

## 2019-02-07 DIAGNOSIS — Z6833 Body mass index (BMI) 33.0-33.9, adult: Secondary | ICD-10-CM | POA: Diagnosis not present

## 2019-02-07 DIAGNOSIS — R8569 Abnormal cytological findings in specimens from other digestive organs and abdominal cavity: Secondary | ICD-10-CM | POA: Diagnosis not present

## 2019-02-07 HISTORY — PX: SPHINCTEROTOMY: SHX5544

## 2019-02-07 HISTORY — DX: Gout, unspecified: M10.9

## 2019-02-07 HISTORY — PX: ERCP: SHX5425

## 2019-02-07 HISTORY — PX: REMOVAL OF STONES: SHX5545

## 2019-02-07 HISTORY — PX: UPPER ESOPHAGEAL ENDOSCOPIC ULTRASOUND (EUS): SHX6562

## 2019-02-07 HISTORY — PX: BILIARY BRUSHING: SHX6843

## 2019-02-07 HISTORY — PX: BIOPSY: SHX5522

## 2019-02-07 HISTORY — PX: ESOPHAGOGASTRODUODENOSCOPY (EGD) WITH PROPOFOL: SHX5813

## 2019-02-07 HISTORY — PX: BILIARY DILATION: SHX6850

## 2019-02-07 LAB — COMPREHENSIVE METABOLIC PANEL
ALT: 111 U/L — ABNORMAL HIGH (ref 0–44)
AST: 33 U/L (ref 15–41)
Albumin: 3.9 g/dL (ref 3.5–5.0)
Alkaline Phosphatase: 155 U/L — ABNORMAL HIGH (ref 38–126)
Anion gap: 11 (ref 5–15)
BUN: 11 mg/dL (ref 8–23)
CO2: 25 mmol/L (ref 22–32)
Calcium: 10.5 mg/dL — ABNORMAL HIGH (ref 8.9–10.3)
Chloride: 106 mmol/L (ref 98–111)
Creatinine, Ser: 1.11 mg/dL — ABNORMAL HIGH (ref 0.44–1.00)
GFR calc Af Amer: 58 mL/min — ABNORMAL LOW (ref >60)
GFR calc non Af Amer: 50 mL/min — ABNORMAL LOW (ref >60)
Glucose, Bld: 133 mg/dL — ABNORMAL HIGH (ref 70–99)
Potassium: 3.6 mmol/L (ref 3.5–5.1)
Sodium: 142 mmol/L (ref 135–145)
Total Bilirubin: 0.7 mg/dL (ref 0.3–1.2)
Total Protein: 7.2 g/dL (ref 6.5–8.1)

## 2019-02-07 LAB — LIPASE, BLOOD: Lipase: 77 U/L — ABNORMAL HIGH (ref 11–51)

## 2019-02-07 LAB — AMYLASE: Amylase: 121 U/L — ABNORMAL HIGH (ref 28–100)

## 2019-02-07 SURGERY — ERCP, WITH INTERVENTION IF INDICATED
Anesthesia: General

## 2019-02-07 MED ORDER — PHENYLEPHRINE HCL (PRESSORS) 10 MG/ML IV SOLN
INTRAVENOUS | Status: DC | PRN
  Administered 2019-02-07 (×2): 80 ug via INTRAVENOUS

## 2019-02-07 MED ORDER — GLUCAGON HCL RDNA (DIAGNOSTIC) 1 MG IJ SOLR
INTRAMUSCULAR | Status: DC | PRN
  Administered 2019-02-07 (×2): 0.25 mg via INTRAVENOUS

## 2019-02-07 MED ORDER — GLUCAGON HCL RDNA (DIAGNOSTIC) 1 MG IJ SOLR
INTRAMUSCULAR | Status: AC
  Filled 2019-02-07: qty 1

## 2019-02-07 MED ORDER — SODIUM CHLORIDE 0.9 % IV SOLN
INTRAVENOUS | Status: DC | PRN
  Administered 2019-02-07: 09:00:00 25 mL

## 2019-02-07 MED ORDER — CIPROFLOXACIN IN D5W 400 MG/200ML IV SOLN
INTRAVENOUS | Status: DC | PRN
  Administered 2019-02-07: 400 mg via INTRAVENOUS

## 2019-02-07 MED ORDER — LIDOCAINE HCL (CARDIAC) PF 100 MG/5ML IV SOSY
PREFILLED_SYRINGE | INTRAVENOUS | Status: DC | PRN
  Administered 2019-02-07: 80 mg via INTRAVENOUS

## 2019-02-07 MED ORDER — DEXAMETHASONE SODIUM PHOSPHATE 10 MG/ML IJ SOLN
INTRAMUSCULAR | Status: DC | PRN
  Administered 2019-02-07: 5 mg via INTRAVENOUS

## 2019-02-07 MED ORDER — ONDANSETRON HCL 4 MG/2ML IJ SOLN
INTRAMUSCULAR | Status: DC | PRN
  Administered 2019-02-07: 4 mg via INTRAVENOUS

## 2019-02-07 MED ORDER — INDOMETHACIN 50 MG RE SUPP
RECTAL | Status: AC
  Filled 2019-02-07: qty 2

## 2019-02-07 MED ORDER — INDOMETHACIN 50 MG RE SUPP
RECTAL | Status: DC | PRN
  Administered 2019-02-07: 100 mg via RECTAL

## 2019-02-07 MED ORDER — LACTATED RINGERS IV SOLN
INTRAVENOUS | Status: AC | PRN
  Administered 2019-02-07: 1000 mL via INTRAVENOUS

## 2019-02-07 MED ORDER — CIPROFLOXACIN IN D5W 400 MG/200ML IV SOLN
INTRAVENOUS | Status: AC
  Filled 2019-02-07: qty 200

## 2019-02-07 MED ORDER — PROPOFOL 10 MG/ML IV BOLUS
INTRAVENOUS | Status: DC | PRN
  Administered 2019-02-07: 50 mg via INTRAVENOUS
  Administered 2019-02-07: 150 mg via INTRAVENOUS

## 2019-02-07 MED ORDER — SUCCINYLCHOLINE CHLORIDE 20 MG/ML IJ SOLN
INTRAMUSCULAR | Status: DC | PRN
  Administered 2019-02-07: 120 mg via INTRAVENOUS

## 2019-02-07 MED ORDER — FENTANYL CITRATE (PF) 100 MCG/2ML IJ SOLN
INTRAMUSCULAR | Status: DC | PRN
  Administered 2019-02-07: 100 ug via INTRAVENOUS

## 2019-02-07 MED ORDER — SODIUM CHLORIDE 0.9 % IV SOLN: INTRAVENOUS | Status: DC

## 2019-02-07 NOTE — Interval H&P Note (Signed)
History and Physical Interval Note:  02/07/2019 7:41 AM  Carol Keith  has presented today for surgery, with the diagnosis of Elevated lfts Biliary dilation.  The various methods of treatment have been discussed with the patient and family. After consideration of risks, benefits and other options for treatment, the patient has consented to  Procedure(s): ENDOSCOPIC RETROGRADE CHOLANGIOPANCREATOGRAPHY (ERCP) (N/A) ESOPHAGEAL ENDOSCOPIC ULTRASOUND (EUS) RADIAL (N/A) as a surgical intervention.  The patient's history has been reviewed, patient examined, no change in status, stable for surgery.  I have reviewed the patient's chart and labs.  Questions were answered to the patient's satisfaction.    The risks of EUS including bleeding, infection, aspiration pneumonia and intestinal perforation were discussed as was the possibility it may not give a definitive diagnosis.  If a biopsy of the pancreas is done as part of the EUS, there is an additional risk of pancreatitis at the rate of about 1%.  It was explained that procedure related pancreatitis is typically mild, although can be severe and even life threatening, which is why we do not perform random pancreatic biopsies and only biopsy a lesion we feel is concerning enough to warrant the risk.   The risks of an ERCP were discussed at length, including but not limited to the risk of perforation, bleeding, abdominal pain, post-ERCP pancreatitis (while usually mild can be severe and even life threatening).    [employer]

## 2019-02-07 NOTE — Progress Notes (Signed)
Spoke with patient, she will have labwork done at her PCP because our office is very far from her. Follow up made for 5/13 with Dr. Tarri Glenn.

## 2019-02-07 NOTE — Op Note (Signed)
[facility] Patient Name: Carol Keith Procedure Date : 02/07/2019 MRN: [id number] Attending MD: Justice Britain , MD Date of Birth: 21-May-1951 CSN: [id number] Age: 68 Admit Type: Outpatient Procedure:                ERCP Indications:              Abdominal pain of suspected biliary origin,                            Abnormal abdominal CT, Biliary dilation on Computed                            Tomogram Scan, Abnormal endoscopic ultrasound of                            the biliary system, Abnormal liver function test Providers:                Justice Britain, MD, Carlyn Reichert, RN, Grace Isaac, RN, Baird Cancer, RN, Elspeth Cho Tech.,                            Technician, Raphael Gibney, CRNA Referring MD:             Thornton Park MD, MD Medicines:                General Anesthesia, Cipro 400 mg IV, Indomethacin                            662 mg PR Complications:            No immediate complications. Estimated Blood Loss:     Estimated blood loss was minimal. Procedure:                Pre-Anesthesia Assessment:                           - Prior to the procedure, a History and Physical                            was performed, and patient medications and                            allergies were reviewed. The patient's tolerance of                            previous anesthesia was also reviewed. The risks                            and benefits of the procedure and the sedation                            options and risks were discussed with the patient.  All questions were answered, and informed consent                            was obtained. Prior Anticoagulants: The patient has                            taken no previous anticoagulant or antiplatelet                            agents except for aspirin. ASA Grade Assessment:                            III - A patient with severe systemic  disease. After                            reviewing the risks and benefits, the patient was                            deemed in satisfactory condition to undergo the                            procedure.                           After obtaining informed consent, the scope was                            passed under direct vision. Throughout the                            procedure, the patient's blood pressure, pulse, and                            oxygen saturations were monitored continuously. The                            TJF-Q180V ([id number] Olympus duodenoscope was                            introduced through the mouth, and used to inject                            contrast into and used to inject contrast into the                            bile duct. The ERCP was accomplished without                            difficulty. The patient tolerated the procedure. Findings:      The scout film was normal.      The esophagus was successfully intubated under direct vision without       detailed examination of the pharynx, larynx, and associated structures,       and upper GI tract. The major papilla was normal.  A short 0.035 inch Soft Jagwire was passed into the biliary tree with       one attempt. The Jagtome sphincterotome was passed over the guidewire       and the bile duct was then deeply cannulated. Contrast was injected. I       personally interpreted the bile duct images. Ductal flow of contrast was       adequate. Image quality was adequate. Contrast extended to the hepatic       ducts. Opacification of the entire biliary tree except for the cystic       duct and gallbladder was successful. The lower third of the main duct       contained filling defect thought to be sludge/debris (previously noted       on the EUS). The main bile duct was moderately dilated. The largest       diameter was 14 mm. The biliary pancreatic junction and distal CBD       contained a single mild  narrowing/caliber change (query the thickening       noted on EUS). A 7 mm biliary sphincterotomy was made with a       monofilament Jagtome sphincterotome using ERBE electrocautery. There was       no post-sphincterotomy bleeding. To discover objects, the biliary tree       was swept with a retrieval balloon starting at the bifurcation. Dark,       biliary sludge was swept from the duct. An occlusion cholangiogram was       performed that showed no further significant biliary pathology. I       watched for the next 5-minutes and drainage was poor. Decision made to       proceed with dilation of distal CBD as a sphincteroplasty. The lower       third of the main bile duct was successfully dilated with a Hurricane 6       mm balloon dilator and then an 05-19-09 mm balloon (to a maximum balloon       size of 8 mm) with both dilations lasting for 4-minutes total. Drainage       was very adequate thereafter. Decision made for cells for cytology to be       obtained by brushing in the lower third of the main bile duct.      A pancreatogram was not performed.      The duodenoscope was withdrawn from the patient. Impression:               - The major papilla appeared normal.                           - The fluroscopic examination was suspicious for                            sludge.                           - A single mild biliary narrowing/caliber change                            noted in distal CBD.                           - The  entire main bile duct was moderately dilated.                           - A biliary sphincterotomy was performed.                           - The biliary tree was swept and sludge was found.                           - Drainage was poor with just sphincterotomy, so                            decision to proceed with sphincteroplasty and                            distal CBD dilation.                           - Cells for cytology obtained in the lower third of                             the main duct.                           - Drainage significantly improved post dilation. Recommendation:           - The patient will be observed post-procedure,                            until all discharge criteria are met.                           - Discharge patient to home.                           - Watch for pancreatitis, bleeding, perforation,                            and cholangitis.                           - Avoid nonsteroidal anti-inflammatory medicines                            for 1 week to decrease risk of post-sphincterotomy                            bleeding.                           - Observe patient's clinical course.                           - Await cytology results.                           - Check liver enzymes (AST,  ALT, alkaline                            phosphatase, bilirubin) in 1-2 weeks.                           - Return to GI clinic as previously scheduled.                           - The findings and recommendations were discussed                            with the patient.                           - The findings and recommendations were discussed                            with the patient's family. Procedure Code(s):        --- Professional ---                           [phone], Endoscopic retrograde                            cholangiopancreatography (ERCP); with removal of                            calculi/debris from biliary/pancreatic duct(s) Diagnosis Code(s):        --- Professional ---                           K83.1, Obstruction of bile duct                           K83.8, Other specified diseases of biliary tract                           R10.9, Unspecified abdominal pain                           R94.5, Abnormal results of liver function studies                           R93.5, Abnormal findings on diagnostic imaging of                            other abdominal regions, including retroperitoneum                            R93.2, Abnormal findings on diagnostic imaging of                            liver and biliary tract CPT copyright 2019 American Medical Association. All rights reserved. The codes documented in this report are preliminary and upon coder review may  be revised to meet current compliance requirements. [employer],  MD 02/07/2019 9:50:55 AM Number of Addenda: 0

## 2019-02-07 NOTE — Anesthesia Procedure Notes (Signed)
Procedure Name: Intubation Date/Time: 02/07/2019 7:52 AM Performed by: Pierre Cumpton T, CRNA Pre-anesthesia Checklist: Patient identified, Emergency Drugs available, Suction available and Patient being monitored Patient Re-evaluated:Patient Re-evaluated prior to induction Oxygen Delivery Method: Circle system utilized Preoxygenation: Pre-oxygenation with 100% oxygen Induction Type: IV induction, Rapid sequence and Cricoid Pressure applied Laryngoscope Size: Miller and 2 Grade View: Grade I Tube type: Oral Tube size: 7.5 mm Number of attempts: 1 Airway Equipment and Method: Patient positioned with wedge pillow and Stylet Placement Confirmation: ETT inserted through vocal cords under direct vision,  positive ETCO2 and breath sounds checked- equal and bilateral Secured at: 21 cm Tube secured with: Tape Dental Injury: Teeth and Oropharynx as per pre-operative assessment

## 2019-02-07 NOTE — Transfer of Care (Signed)
Immediate Anesthesia Transfer of Care Note  Patient: Carol Keith  Procedure(s) Performed: ENDOSCOPIC RETROGRADE CHOLANGIOPANCREATOGRAPHY (ERCP) (N/A ) UPPER ENDOSCOPIC ULTRASOUND (EUS) LINEAR BIOPSY BILIARY BRUSHING SPHINCTEROTOMY REMOVAL OF STONES BILIARY DILATION ESOPHAGOGASTRODUODENOSCOPY (EGD) WITH PROPOFOL (N/A )  Patient Location: Endoscopy Unit  Anesthesia Type:General  Level of Consciousness: awake, alert  and oriented  Airway & Oxygen Therapy: Patient Spontanous Breathing and Patient connected to nasal cannula oxygen  Post-op Assessment: Report given to RN, Post -op Vital signs reviewed and stable and Patient moving all extremities  Post vital signs: Reviewed and stable  Last Vitals:  Vitals Value Taken Time  BP 173/80 02/07/2019  9:52 AM  Temp    Pulse 85 02/07/2019  9:53 AM  Resp 16 02/07/2019  9:53 AM  SpO2 98 % 02/07/2019  9:53 AM  Vitals shown include unvalidated device data.  Last Pain:  Vitals:   02/07/19 0943  TempSrc:   PainSc: 0-No pain         Complications: No apparent anesthesia complications

## 2019-02-07 NOTE — Anesthesia Postprocedure Evaluation (Signed)
Anesthesia Post Note  Patient: Carol Keith  Procedure(s) Performed: ENDOSCOPIC RETROGRADE CHOLANGIOPANCREATOGRAPHY (ERCP) (N/A ) UPPER ENDOSCOPIC ULTRASOUND (EUS) LINEAR BIOPSY BILIARY BRUSHING SPHINCTEROTOMY REMOVAL OF STONES BILIARY DILATION ESOPHAGOGASTRODUODENOSCOPY (EGD) WITH PROPOFOL (N/A )     Patient location during evaluation: PACU Anesthesia Type: General Level of consciousness: awake and alert Pain management: pain level controlled Vital Signs Assessment: post-procedure vital signs reviewed and stable Respiratory status: spontaneous breathing, nonlabored ventilation and respiratory function stable Cardiovascular status: blood pressure returned to baseline and stable Postop Assessment: no apparent nausea or vomiting Anesthetic complications: no    Last Vitals:  Vitals:   02/07/19 0952 02/07/19 1002  BP: (!) 173/80 (!) 177/78  Pulse: 86 86  Resp: 10 14  Temp:    SpO2: 100% 99%    Last Pain:  Vitals:   02/07/19 1002  TempSrc:   PainSc: 0-No pain                 Lynda Rainwater

## 2019-02-07 NOTE — Op Note (Signed)
[facility] Patient Name: Carol Keith Procedure Date : 02/07/2019 MRN: [id number] Attending MD: Justice Britain , MD Date of Birth: 09-21-1951 CSN: [id number] Age: 68 Admit Type: Outpatient Procedure:                Upper EUS Indications:              Common bile duct dilation (etiology unknown) seen                            on CT scan, Abnormal liver function test, Suspected                            choledocholithiasis, Abdominal pain in the right                            upper quadrant Providers:                Justice Britain, MD, Carlyn Reichert, RN, Grace Isaac, RN, Baird Cancer, RN, Elspeth Cho Tech.,                            Technician, Raphael Gibney, CRNA Referring MD:             Thornton Park MD, MD Medicines:                General Anesthesia Complications:            No immediate complications. Estimated Blood Loss:     Estimated blood loss was minimal. Procedure:                Pre-Anesthesia Assessment:                           - Prior to the procedure, a History and Physical                            was performed, and patient medications and                            allergies were reviewed. The patient's tolerance of                            previous anesthesia was also reviewed. The risks                            and benefits of the procedure and the sedation                            options and risks were discussed with the patient.                            All questions were answered, and informed consent  was obtained. Prior Anticoagulants: The patient has                            taken no previous anticoagulant or antiplatelet                            agents except for aspirin. ASA Grade Assessment:                            III - A patient with severe systemic disease. After                            reviewing the risks and benefits, the patient was                        deemed in satisfactory condition to undergo the                            procedure.                           After obtaining informed consent, the endoscope was                            passed under direct vision. Throughout the                            procedure, the patient's blood pressure, pulse, and                            oxygen saturations were monitored continuously. The                            GIF-H190 ([id number]) Olympus gastroscope was                            introduced through the mouth, and advanced to the                            second part of duodenum. The TJF-Q180V ([id number])                            Olympus duodenoscope was introduced through the                            mouth, and advanced to the second part of duodenum.                            The GF-UCT180 ([id number]) Olympus Linear EUS was                            introduced through the mouth, and advanced to the  duodenum for ultrasound examination from the                            stomach and duodenum. The upper EUS was                            accomplished without difficulty. The patient                            tolerated the procedure. Scope In: Scope Out: Findings:      ENDOSCOPIC FINDING: :      No gross lesions were noted in the proximal esophagus and in the mid       esophagus.      The distal esophagus was mildly tortuous.      Striped mildly erythematous mucosa without bleeding was found in the       gastric antrum and in the prepyloric region of the stomach. Biopsies       were taken with a cold forceps for histology and Helicobacter pylori       testing.      No gross lesions were noted in the duodenal bulb, in the first portion       of the duodenum, in the second portion of the duodenum and in the major       papilla. Biopsies for histology were taken with a cold forceps for       evaluation of celiac disease.       ENDOSONOGRAPHIC FINDING: :      Minimal low attentuation, not hyperechoic material consistent with some       biliary sludge/debris was visualized endosonographically in the distal       common bile duct.      There was dilation in the common bile duct (6.5 mm -> 9.2 mm) and in the       common hepatic duct (>10 mm).      There was evidence of bile duct thickening at the very distal CBD. The       wall thickening measured 3.3 mm in size. This is just above the       biliary/pancreatic juction of the ampulla.      Pancreatic parenchymal abnormalities were noted in the genu of the       pancreas, pancreatic body and pancreatic tail. These consisted of       hyperechoic strands.      The pancreatic duct had a normal endosonographic appearance in the       pancreatic head (2.6 mm), genu of the pancreas (0.8 mm), body of the       pancreas (0.7 mm) and tail of the pancreas (0.7 mm).      Endosonographic imaging of the ampulla showed no mass-lesion.      Endosonographic imaging in the visualized portion of the liver showed no       mass-lesion.      No malignant-appearing lymph nodes were visualized in the celiac region       (level 20), perigastric region, peripancreatic region and porta hepatis       region.      The celiac region was visualized. Impression:               EGD Impression:                           -  No gross lesions in esophagus other than mildly                            tortuous esophagus distally.                           - Erythematous mucosa in the antrum and prepyloric                            region of the stomach. Biopsied.                           - No gross lesions in the duodenal bulb, in the                            first portion of the duodenum, in the second                            portion of the duodenum and in the major papilla.                            Biopsied.                           EUS Impression:                           - Isoechoic  material consistent with biliary                            sludge/debris was visualized endosonographically in                            the common bile duct.                           - There was dilation in the common bile duct and in                            the common hepatic duct.                           - Bile duct thickening was noted at the distal CBD.                           - Pancreatic parenchymal abnormalities consisting                            of hyperechoic strands were noted in the genu of                            the pancreas, pancreatic body and pancreatic tail.                           - The pancreatic duct had a  normal endosonographic                            appearance in the pancreatic head, genu of the                            pancreas, body of the pancreas and tail of the                            pancreas.                           - No malignant-appearing lymph nodes were                            visualized in the celiac region (level 20),                            perigastric region, peripancreatic region and porta                            hepatis region. Recommendation:           - Proceed to ERCP for attempt at biliary duct                            cannulation and evaluation/treatment.                           - Await path results.                           - Continue Protonix Daily for now.                           - The findings and recommendations were discussed                            with the patient.                           - The findings and recommendations were discussed                            with the patient's family. Procedure Code(s):        --- Professional ---                           [phone], Esophagogastroduodenoscopy, flexible,                            transoral; with endoscopic ultrasound examination                            limited to the esophagus, stomach or duodenum, and  adjacent structures                           43239, Esophagogastroduodenoscopy, flexible,                            transoral; with biopsy, single or multiple Diagnosis Code(s):        --- Professional ---                           Q39.9, Congenital malformation of esophagus,                            unspecified                           K31.89, Other diseases of stomach and duodenum                           K83.8, Other specified diseases of biliary tract                           K86.9, Disease of pancreas, unspecified                           I89.9, Noninfective disorder of lymphatic vessels                            and lymph nodes, unspecified                           R94.5, Abnormal results of liver function studies                           R10.11, Right upper quadrant pain                           R93.2, Abnormal findings on diagnostic imaging of                            liver and biliary tract CPT copyright 2019 American Medical Association. All rights reserved. The codes documented in this report are preliminary and upon coder review may  be revised to meet current compliance requirements. Justice Britain, MD 02/07/2019 10:07:30 AM Number of Addenda: 0

## 2019-02-07 NOTE — Discharge Instructions (Signed)
YOU HAD AN ENDOSCOPIC PROCEDURE TODAY: Refer to the procedure report and other information in the discharge instructions given to you for any specific questions about what was found during the examination. If this information does not answer your questions, please call [place] office at [phone] to clarify.   YOU SHOULD EXPECT: Some feelings of bloating in the abdomen. Passage of more gas than usual. Walking can help get rid of the air that was put into your GI tract during the procedure and reduce the bloating. If you had a lower endoscopy (such as a colonoscopy or flexible sigmoidoscopy) you may notice spotting of blood in your stool or on the toilet paper. Some abdominal soreness may be present for a day or two, also.  DIET: Your first meal following the procedure should be a light meal and then it is ok to progress to your normal diet. A half-sandwich or bowl of soup is an example of a good first meal. Heavy or fried foods are harder to digest and may make you feel nauseous or bloated. Drink plenty of fluids but you should avoid alcoholic beverages for 24 hours. If you had a esophageal dilation, please see attached instructions for diet.    ACTIVITY: Your care partner should take you home directly after the procedure. You should plan to take it easy, moving slowly for the rest of the day. You can resume normal activity the day after the procedure however YOU SHOULD NOT DRIVE, use power tools, machinery or perform tasks that involve climbing or major physical exertion for 24 hours (because of the sedation medicines used during the test).   SYMPTOMS TO REPORT IMMEDIATELY: A gastroenterologist can be reached at any hour. Please call [phone]  for any of the following symptoms:   Following upper endoscopy (EGD, EUS, ERCP, esophageal dilation) Vomiting of blood or coffee ground material  New, significant abdominal pain  New, significant chest pain or pain under the shoulder blades  Painful or  persistently difficult swallowing  New shortness of breath  Black, tarry-looking or red, bloody stools  FOLLOW UP:  If any biopsies were taken you will be contacted by phone or by letter within the next 1-3 weeks. Call [phone]  if you have not heard about the biopsies in 3 weeks.  Please also call with any specific questions about appointments or follow up tests.  

## 2019-02-08 ENCOUNTER — Encounter: Payer: Self-pay | Admitting: Gastroenterology

## 2019-02-08 ENCOUNTER — Telehealth: Payer: Self-pay | Admitting: Gastroenterology

## 2019-02-08 NOTE — Telephone Encounter (Signed)
I called and spoke with the patient about the results of the gastric and duodenal biopsies as well as the biliary brushings. Letter to be sent to the patient for documentation purposes. She has atypical cells on the bile duct brushings. I suspect this is most likely a result of an inflammatory process and likely a passed stone.  With that being said she does have some distal CBD thickening as noted on my EUS yesterday without any overt pancreatic mass.  I think what would be most reasonable for this patient would be to have a MRI/MRCP performed in approximately 6 to 8 weeks to allow time to heal from her recent issues. I think after that considering a repeat EUS and possibly bile duct biopsies as well as ERCP brushings to be repeated could be considered. The patient is having a sore throat and some cough but no fevers or chills. She is not having any abdominal pain. I asked the patient to get some Cipro lozenges or Chloraseptic spray to help with her sore throat which I anticipate improvement within the next couple of days. She has follow up with Dr. Tarri Glenn in a few weeks via telehealth as well as repeat liver tests to be done. I will work with Dr. Tarri Glenn in order to proceed with follow-up which as noted above will likely be MRI imaging and then consideration of further testing or repeat EUS with ERCP down the road depending on how patient is doing. I am relaying this information to Dr. Tarri Glenn as well. Patient will call if she has further questions otherwise she will have them all set up for Dr. Tarri Glenn at her upcoming follow-up appointment. The patient is very appreciative for all the care she has received through the [facility].   Justice Britain, MD [facility] Office # [phone]

## 2019-02-08 NOTE — Telephone Encounter (Signed)
Thanks for your help with this patient. I will arrange for MRI/MRCP in 6-8 weeks, in addition to the plans for labs and follow-up encounter before then. I'll review the results of the MRI/MRCP with you when they are available. Thanks, again, for your help.

## 2019-02-08 NOTE — Telephone Encounter (Signed)
Perfect, let me know if any other questions. GM

## 2019-02-09 ENCOUNTER — Encounter (HOSPITAL_COMMUNITY): Payer: Self-pay | Admitting: Gastroenterology

## 2019-02-15 DIAGNOSIS — R945 Abnormal results of liver function studies: Secondary | ICD-10-CM | POA: Diagnosis not present

## 2019-02-21 ENCOUNTER — Other Ambulatory Visit: Payer: Self-pay

## 2019-02-21 ENCOUNTER — Encounter: Payer: Self-pay | Admitting: Gastroenterology

## 2019-02-21 ENCOUNTER — Ambulatory Visit (INDEPENDENT_AMBULATORY_CARE_PROVIDER_SITE_OTHER): Payer: Medicare Other | Admitting: Gastroenterology

## 2019-02-21 VITALS — Ht 63.0 in | Wt 187.0 lb

## 2019-02-21 DIAGNOSIS — K838 Other specified diseases of biliary tract: Secondary | ICD-10-CM | POA: Diagnosis not present

## 2019-02-21 DIAGNOSIS — R932 Abnormal findings on diagnostic imaging of liver and biliary tract: Secondary | ICD-10-CM | POA: Diagnosis not present

## 2019-02-21 DIAGNOSIS — R748 Abnormal levels of other serum enzymes: Secondary | ICD-10-CM

## 2019-02-21 DIAGNOSIS — R945 Abnormal results of liver function studies: Secondary | ICD-10-CM

## 2019-02-21 NOTE — Progress Notes (Signed)
TELEHEALTH VISIT  Referring Provider: Monico Blitz, MD Primary Care Physician:  Monico Blitz, MD   Tele-visit due to COVID-19 pandemic Patient requested visit virtually, consented to the virtual encounter via telephone Contact made at: 02/21/19 14:06 Patient verified by name and date of birth Location of patient: Home Location provider: [place] [facility] Names of persons participating: Me, patient, Tinnie Gens CMA Time spent on telehealth visit:  23 minutes I discussed the limitations of evaluation and management by telemedicine. The patient expressed understanding and agreed to proceed.  Chief complaint: Epigastric pain, abnormal liver enzymes   IMPRESSION:  Atypical bile duct brushings 02/07/19    - likely due to inflammatory process such as a passed stone CBD thickening noted on EUS     - no overt pancreatic mass Mild, chronic gastritis without H pylori on gastric biopsies 02/07/19 Abnormal liver enzymes: AST 425, ALT 282, alk phos 155, total bilirubin 1.0 Abnormal biliary tree on CT scan showing a common bile duct of 1.4 cm tapering within the pancreatic head to 1.2 cm without obvious obstructive mass or stone Cholestyctomy for gallstones 1990s History of colon polyps    - per patient report on colonoscopy in [place] in 2018 Family history of colon cancer (sister in her 86s) BMI 35  I reviewed the EUS and ERCP findings with the patient.   In particular we reviewed the pathology results showing atypical bile duct brushings, chronic gastritis without H. pylori, and normal duodenal biopsies with no evidence for celiac. Liver enzymes have normalized following her procedures.  She may have had choledocholithiasis and passed the stone prior to her EUS and ERCP given her acute symptoms, abnormal liver enzymes, and CT scan results. Given her atypical bile duct brushings and concerns for possible malignancy, will plan repeat imaging +/- repeat endoscopy for further evaluation.     The patient had many questions about the pathology letter that she received from Dr. Rush Landmark. We went through the letter line by line to answer her questions regarding unfamiliar terminology. All questions were answered to her satisfaction.   PLAN: - MRI/MRCP in mid-June - EUS and possibly bile duct biopsies as well as ERCP brushings to be repeated based on review of the MRI/MRCP - She would like to have her surveillance colonoscopy in [place]    HPI: Carol Keith is a 68 y.o. female following up after her initial consultation for abnormal liver enzymes and epigastric pain.  The interval history is obtained through the patient, review of her electronic health record, and my discussions with Dr. Rush Landmark.  She has obesity with a BMI of 35, hypercholesterolemia, hypertension, vitamin D deficiency.  She has had a cholecystectomy.  She recent had an EUS and ERCP for evaluation of epigastric pain and abnormal liver enzymes, similar to the symptoms at the time of gallstones treated with cholcyestectomy in the 1990s.  Otherwise, no history of similar symptoms.   EUS 02/07/2019 showed a mildly tortuous distal esophagus, linear gastritis, and a normal duodenum.  Endosonographic findings revealed dilation in the common bile duct and in the common hepatic duct. Isoechoic material consistent with biliary sludge distal debris was visualized in the common bile duct.  There was evidence of bile duct thickening at the very distal CBD.  The wall thickening measured 3.3 mm.  Pancreatic parenchymal abnormalities consisting of hyperechoic strands were noted in the pancreas.  The pancreatic duct was normal. No lymph nodes were identified.    An ERCP 02/07/2019 showed a fluoroscopic exam suspicious  for sludge, mild biliary narrowing and caliber change in the distal common bile duct, and poor drainage.  A sphincteroplasty and distal common bile duct dilation was performed.  Labs from 02/15/19: alb 3.9, TB  0.3, AST 16, ALT 25, alk phos 107  She has done well following her EUS and ERCP. Occasional fleeting nausea after eating. No significant abdominal pain or vomiting. Good appetite. Stable weight. No new complaints or concerns.   Past Medical History:  Diagnosis Date   Allergic rhinitis    Arthritis    kneees, back    Bursitis of right hip    Cancer ([place])    breast cancer   Cerumen impaction    Chronic kidney disease    appt. schedule for Dr. Lynnette Caffey, [id number]   Complication of anesthesia    Diabetes mellitus without complication ([place])    Type II   Dizziness    Fatigue    Frequent urination    Gout    Heart murmur    Echo11/21/16 ([facility]): LVEF 50-55%, reduced LV diastolic function, mild TR    Hypercholesteremia    Hypertension    Hypokalemia    Left foot pain    Left shoulder pain    Lumbago    Oral candida    Ovarian failure    Plantar fascial fibromatosis    PONV (postoperative nausea and vomiting)    Proteinuria    Right knee pain     Past Surgical History:  Procedure Laterality Date   ABDOMINAL HYSTERECTOMY     APPENDECTOMY     BILIARY BRUSHING  02/07/2019   Procedure: BILIARY BRUSHING;  Surgeon: Irving Copas., MD;  Location: [place];  Service: Gastroenterology;;   BILIARY DILATION  02/07/2019   Procedure: BILIARY DILATION;  Surgeon: Irving Copas., MD;  Location: [place];  Service: Gastroenterology;;   BIOPSY  02/07/2019   Procedure: BIOPSY;  Surgeon: Irving Copas., MD;  Location: [place];  Service: Gastroenterology;;   BREAST SURGERY Right    for Breast Ca, tx with surg. & chemo & radiation     CHOLECYSTECTOMY     COLONOSCOPY     ERCP N/A 02/07/2019   Procedure: ENDOSCOPIC RETROGRADE CHOLANGIOPANCREATOGRAPHY (ERCP);  Surgeon: Irving Copas., MD;  Location: [place];  Service: Gastroenterology;  Laterality: N/A;   ESOPHAGOGASTRODUODENOSCOPY (EGD) WITH PROPOFOL N/A  02/07/2019   Procedure: ESOPHAGOGASTRODUODENOSCOPY (EGD) WITH PROPOFOL;  Surgeon: Rush Landmark Telford Nab., MD;  Location: [place];  Service: Gastroenterology;  Laterality: N/A;   KNEE ARTHROPLASTY Right 05/17/2016   Procedure: RIGHT TOTAL KNEE ARTHROPLASTY WITH COMPUTER NAVIGATION;  Surgeon: Rod Can, MD;  Location: [place];  Service: Orthopedics;  Laterality: Right;  Needs RNFA   KNEE ARTHROPLASTY Left 01/24/2017   Procedure: LEFT TOTAL KNEE ARTHROPLASTY WITH COMPUTER NAVIGATION;  Surgeon: Rod Can, MD;  Location: [place];  Service: Orthopedics;  Laterality: Left;  Dr. requesting RNFA   LAPAROSCOPY  1999   done at [facility], "something with separating my organs because things had grown together"   REMOVAL OF STONES  02/07/2019   Procedure: REMOVAL OF STONES;  Surgeon: Irving Copas., MD;  Location: [place];  Service: Gastroenterology;;   [name]  02/07/2019   Procedure: [name];  Surgeon: Irving Copas., MD;  Location: [place];  Service: Gastroenterology;;   TONSILLECTOMY     TUBAL LIGATION     UPPER ESOPHAGEAL ENDOSCOPIC ULTRASOUND (EUS)  02/07/2019   Procedure: UPPER ESOPHAGEAL ENDOSCOPIC ULTRASOUND (EUS);  Surgeon: Irving Copas., MD;  [facility];  Service: Gastroenterology;;    Current Outpatient Medications  Medication Sig Dispense Refill   allopurinol (ZYLOPRIM) 100 MG tablet Take 100 mg by mouth daily.      amLODipine (NORVASC) 5 MG tablet Take 5 mg by mouth daily.     aspirin EC 81 MG tablet Take 81 mg by mouth daily.     bumetanide (BUMEX) 1 MG tablet Take 1 mg by mouth daily.     Exenatide ER (BYDUREON BCISE) 2 MG/0.85ML AUIJ Inject 2 mg into the skin every Saturday.      fluticasone (FLONASE) 50 MCG/ACT nasal spray Place 1 spray into both nostrils daily as needed (allergies.).      glimepiride (AMARYL) 2 MG tablet Take 2 mg by mouth daily with breakfast.     glucose blood test strip 1 each by  Other route as needed for other. Use as instructed     Insulin Pen Needle (NOVOFINE) 32G X 6 MM MISC by Does not apply route.     losartan (COZAAR) 100 MG tablet Take 100 mg by mouth daily.     metoprolol succinate (TOPROL-XL) 100 MG 24 hr tablet Take 100 mg by mouth daily with breakfast. Take with or immediately following a meal.      ondansetron (ZOFRAN) 4 MG tablet Take 1 tablet (4 mg total) by mouth every 6 (six) hours as needed for nausea. 20 tablet 0   oxyCODONE-acetaminophen (PERCOCET/ROXICET) 5-325 MG tablet Take 1 tablet by mouth every 6 (six) hours as needed for severe pain.     pantoprazole (PROTONIX) 40 MG tablet Take 40 mg by mouth daily before breakfast.     Polyethyl Glycol-Propyl Glycol (LUBRICANT EYE DROPS) 0.4-0.3 % SOLN Place 1 drop into both eyes 3 (three) times daily as needed (dry/irritated eyes.).     polyethylene glycol (MIRALAX / GLYCOLAX) 17 g packet Take 17 g by mouth daily as needed (constipation.).     potassium chloride (K-DUR) 10 MEQ tablet Take 10 mEq by mouth daily.     rosuvastatin (CRESTOR) 10 MG tablet Take 10 mg by mouth at bedtime.      sucralfate (CARAFATE) 1 g tablet Take 1 g by mouth 2 (two) times a day.      TOUJEO SOLOSTAR 300 UNIT/ML SOPN Inject 45 Units into the skin at bedtime.     No current facility-administered medications for this visit.     Allergies as of 02/21/2019 - Review Complete 02/21/2019  Allergen Reaction Noted   Ace inhibitors  01/30/2019   Actos [pioglitazone]  01/30/2019   Lipitor [atorvastatin]  01/30/2019   Metformin and related  01/30/2019    Family History  Problem Relation Age of Onset   Breast cancer Sister        age 66    Social History   Socioeconomic History   Marital status: Married    Spouse name: Not on file   Number of children: Not on file   Years of education: Not on file   Highest education level: Not on file  Occupational History    Comment: [employer]  Social Needs    Financial resource strain: Not on file   Food insecurity:    Worry: Not on file    Inability: Not on file   Transportation needs:    Medical: Not on file    Non-medical: Not on file  Tobacco Use   Smoking status: Never Smoker   Smokeless tobacco: Never Used  Substance and Sexual Activity  Alcohol use: No   Drug use: No   Sexual activity: Not on file  Lifestyle   Physical activity:    Days per week: Not on file    Minutes per session: Not on file   Stress: Not on file  Relationships   Social connections:    Talks on phone: Not on file    Gets together: Not on file    Attends religious service: Not on file    Active member of club or organization: Not on file    Attends meetings of clubs or organizations: Not on file    Relationship status: Not on file   Intimate partner violence:    Fear of current or ex partner: Not on file    Emotionally abused: Not on file    Physically abused: Not on file    Forced sexual activity: Not on file  Other Topics Concern   Not on file  Social History Narrative   Not on file     Physical Exam: General: in no acute distress Neuro: Alert and appropriate Psych: Normal affect and normal insight   Norma Montemurro L. Tarri Glenn, MD, MPH [facility] 02/21/2019, 9:32 AM

## 2019-02-21 NOTE — Patient Instructions (Signed)
We will plan for an MRI/MRCP in mid-June.  Please contact me with any questions or concerns in the meantime.   Thank you for your patience with me and our technology today!

## 2019-02-26 DIAGNOSIS — E119 Type 2 diabetes mellitus without complications: Secondary | ICD-10-CM | POA: Diagnosis not present

## 2019-02-26 DIAGNOSIS — I2510 Atherosclerotic heart disease of native coronary artery without angina pectoris: Secondary | ICD-10-CM | POA: Diagnosis not present

## 2019-02-26 DIAGNOSIS — I10 Essential (primary) hypertension: Secondary | ICD-10-CM | POA: Diagnosis not present

## 2019-02-26 DIAGNOSIS — E7800 Pure hypercholesterolemia, unspecified: Secondary | ICD-10-CM | POA: Diagnosis not present

## 2019-03-21 ENCOUNTER — Ambulatory Visit
Admission: RE | Admit: 2019-03-21 | Discharge: 2019-03-21 | Disposition: A | Discharge status: Home / Self Care | Payer: Medicare Other | Source: Ambulatory Visit | Attending: Gastroenterology | Admitting: Gastroenterology

## 2019-03-21 ENCOUNTER — Other Ambulatory Visit: Payer: Self-pay

## 2019-03-21 DIAGNOSIS — R945 Abnormal results of liver function studies: Secondary | ICD-10-CM

## 2019-03-21 DIAGNOSIS — R933 Abnormal findings on diagnostic imaging of other parts of digestive tract: Secondary | ICD-10-CM | POA: Diagnosis not present

## 2019-03-21 DIAGNOSIS — K760 Fatty (change of) liver, not elsewhere classified: Secondary | ICD-10-CM | POA: Diagnosis not present

## 2019-03-21 MED ORDER — GADOBENATE DIMEGLUMINE 529 MG/ML IV SOLN
18.0000 mL | Freq: Once | INTRAVENOUS | Status: AC | PRN
  Administered 2019-03-21: 18 mL via INTRAVENOUS

## 2019-03-22 ENCOUNTER — Other Ambulatory Visit: Payer: Self-pay | Admitting: *Deleted

## 2019-03-22 DIAGNOSIS — K838 Other specified diseases of biliary tract: Secondary | ICD-10-CM

## 2019-03-26 ENCOUNTER — Telehealth: Payer: Self-pay

## 2019-03-26 NOTE — Telephone Encounter (Signed)
-----   Message from Irving Copas., MD sent at 03/26/2019  1:37 PM EDT ----- Chong Sicilian, I spoke with Dr. Tarri Glenn about this one. We will proceed in 6-8 weeks time for a double. Thank you. GM ----- Message ----- From: Timothy Lasso, RN Sent: 03/26/2019   9:10 AM EDT To: Milus Banister, MD, #  Please review  ----- Message ----- From: Dalene Seltzer, RN Sent: 03/26/2019 To: Timothy Lasso, RN  Delorise Jackson,   I was told that you schedule these EUS/ERCP. I was banging my head against the desk trying to figure out how to do it when [place] walked in and saved me and told me you scheduled them. Anyway, she needs one in 8 weeks per Dr. Tarri Glenn result note on 03/22/2019.    Thanks,   [employer]

## 2019-03-26 NOTE — Telephone Encounter (Signed)
Staff message to call pt in 6 weeks and set up

## 2019-04-04 DIAGNOSIS — E7800 Pure hypercholesterolemia, unspecified: Secondary | ICD-10-CM | POA: Diagnosis not present

## 2019-04-04 DIAGNOSIS — E119 Type 2 diabetes mellitus without complications: Secondary | ICD-10-CM | POA: Diagnosis not present

## 2019-04-04 DIAGNOSIS — I2510 Atherosclerotic heart disease of native coronary artery without angina pectoris: Secondary | ICD-10-CM | POA: Diagnosis not present

## 2019-04-04 DIAGNOSIS — I10 Essential (primary) hypertension: Secondary | ICD-10-CM | POA: Diagnosis not present

## 2019-04-16 DIAGNOSIS — E1165 Type 2 diabetes mellitus with hyperglycemia: Secondary | ICD-10-CM | POA: Diagnosis not present

## 2019-04-16 DIAGNOSIS — Z6835 Body mass index (BMI) 35.0-35.9, adult: Secondary | ICD-10-CM | POA: Diagnosis not present

## 2019-04-16 DIAGNOSIS — I10 Essential (primary) hypertension: Secondary | ICD-10-CM | POA: Diagnosis not present

## 2019-04-16 DIAGNOSIS — Z299 Encounter for prophylactic measures, unspecified: Secondary | ICD-10-CM | POA: Diagnosis not present

## 2019-04-16 DIAGNOSIS — M25511 Pain in right shoulder: Secondary | ICD-10-CM | POA: Diagnosis not present

## 2019-04-18 DIAGNOSIS — M25511 Pain in right shoulder: Secondary | ICD-10-CM | POA: Diagnosis not present

## 2019-05-01 DIAGNOSIS — E119 Type 2 diabetes mellitus without complications: Secondary | ICD-10-CM | POA: Diagnosis not present

## 2019-05-01 DIAGNOSIS — I2510 Atherosclerotic heart disease of native coronary artery without angina pectoris: Secondary | ICD-10-CM | POA: Diagnosis not present

## 2019-05-01 DIAGNOSIS — I10 Essential (primary) hypertension: Secondary | ICD-10-CM | POA: Diagnosis not present

## 2019-05-01 DIAGNOSIS — E7800 Pure hypercholesterolemia, unspecified: Secondary | ICD-10-CM | POA: Diagnosis not present

## 2019-05-07 ENCOUNTER — Telehealth: Payer: Self-pay

## 2019-05-07 NOTE — Telephone Encounter (Signed)
Waiting for Dr Rush Landmark to give me the dates he will be doing procedures.  (8/17 or 8/19)?

## 2019-05-07 NOTE — Telephone Encounter (Signed)
-----   Message from Timothy Lasso, RN sent at 03/26/2019  2:36 PM EDT ----- I spoke with Dr. Tarri Glenn about this one. We will proceed in 6-8 weeks time for a double. Thank you. GM ----- Message ----- From: Timothy Lasso, RN Sent: 03/26/2019   9:10 AM EDT To: Milus Banister, MD, #  Please review  ----- Message ----- From: Dalene Seltzer, RN Sent: 03/26/2019 To: Timothy Lasso, RN  Delorise Jackson,   I was told that you schedule these EUS/ERCP. I was banging my head against the desk trying to figure out how to do it when [place] walked in and saved me and told me you scheduled them. Anyway, she needs one in 8 weeks per Dr. Tarri Glenn result note on 03/22/2019.    Thanks,

## 2019-05-09 NOTE — Telephone Encounter (Signed)
Spoke with Dr Rush Landmark and he is still not sure of the dates he is available.  Will get pt scheduled as soon as I have an answer which day I can use.

## 2019-05-17 DIAGNOSIS — H2513 Age-related nuclear cataract, bilateral: Secondary | ICD-10-CM | POA: Diagnosis not present

## 2019-05-21 NOTE — Telephone Encounter (Signed)
appt with Dr Jerilynn Mages on 9/23 at 945 am for EUS/ERCP at [facility]

## 2019-05-21 NOTE — Telephone Encounter (Signed)
-----   Message from Timothy Lasso, RN sent at 03/26/2019 10:11 AM EDT ----- Cristino Martes to review imaging that looks reassuring. Agree let us move forward with a repeat EUS/ERCP in approximately 8 weeks just to be on the cautious side.  Miata [place], when you return can we get this on our cue? Thank you. GM

## 2019-05-22 ENCOUNTER — Other Ambulatory Visit: Payer: Self-pay

## 2019-05-22 DIAGNOSIS — R748 Abnormal levels of other serum enzymes: Secondary | ICD-10-CM

## 2019-05-22 DIAGNOSIS — K838 Other specified diseases of biliary tract: Secondary | ICD-10-CM

## 2019-05-22 DIAGNOSIS — R932 Abnormal findings on diagnostic imaging of liver and biliary tract: Secondary | ICD-10-CM

## 2019-05-22 NOTE — Telephone Encounter (Signed)
EUSERCP scheduled, pt instructed and medications reviewed.  Patient instructions mailed to home.  Patient to call with any questions or concerns.  

## 2019-05-22 NOTE — Telephone Encounter (Signed)
COVID 06/30/19

## 2019-06-05 DIAGNOSIS — E119 Type 2 diabetes mellitus without complications: Secondary | ICD-10-CM | POA: Diagnosis not present

## 2019-06-05 DIAGNOSIS — E7800 Pure hypercholesterolemia, unspecified: Secondary | ICD-10-CM | POA: Diagnosis not present

## 2019-06-05 DIAGNOSIS — I10 Essential (primary) hypertension: Secondary | ICD-10-CM | POA: Diagnosis not present

## 2019-06-05 DIAGNOSIS — I2510 Atherosclerotic heart disease of native coronary artery without angina pectoris: Secondary | ICD-10-CM | POA: Diagnosis not present

## 2019-06-06 DIAGNOSIS — I10 Essential (primary) hypertension: Secondary | ICD-10-CM | POA: Diagnosis not present

## 2019-06-06 DIAGNOSIS — E1165 Type 2 diabetes mellitus with hyperglycemia: Secondary | ICD-10-CM | POA: Diagnosis not present

## 2019-06-06 DIAGNOSIS — Z6835 Body mass index (BMI) 35.0-35.9, adult: Secondary | ICD-10-CM | POA: Diagnosis not present

## 2019-06-06 DIAGNOSIS — Z789 Other specified health status: Secondary | ICD-10-CM | POA: Diagnosis not present

## 2019-06-06 DIAGNOSIS — Z299 Encounter for prophylactic measures, unspecified: Secondary | ICD-10-CM | POA: Diagnosis not present

## 2019-06-30 ENCOUNTER — Other Ambulatory Visit (HOSPITAL_COMMUNITY)
Admission: RE | Admit: 2019-06-30 | Discharge: 2019-06-30 | Disposition: A | Discharge status: Home / Self Care | Payer: Medicare Other | Source: Ambulatory Visit | Attending: Gastroenterology | Admitting: Gastroenterology

## 2019-06-30 DIAGNOSIS — K2980 Duodenitis without bleeding: Secondary | ICD-10-CM | POA: Diagnosis not present

## 2019-06-30 DIAGNOSIS — R945 Abnormal results of liver function studies: Secondary | ICD-10-CM | POA: Diagnosis not present

## 2019-06-30 DIAGNOSIS — K2970 Gastritis, unspecified, without bleeding: Secondary | ICD-10-CM | POA: Diagnosis not present

## 2019-06-30 DIAGNOSIS — Z01812 Encounter for preprocedural laboratory examination: Secondary | ICD-10-CM | POA: Insufficient documentation

## 2019-06-30 DIAGNOSIS — Z20828 Contact with and (suspected) exposure to other viral communicable diseases: Secondary | ICD-10-CM | POA: Insufficient documentation

## 2019-07-01 LAB — NOVEL CORONAVIRUS, NAA (HOSP ORDER, SEND-OUT TO REF LAB; TAT 18-24 HRS): SARS-CoV-2, NAA: NOT DETECTED

## 2019-07-03 ENCOUNTER — Encounter (HOSPITAL_COMMUNITY): Payer: Self-pay | Admitting: *Deleted

## 2019-07-03 ENCOUNTER — Other Ambulatory Visit: Payer: Self-pay

## 2019-07-03 DIAGNOSIS — E7800 Pure hypercholesterolemia, unspecified: Secondary | ICD-10-CM | POA: Diagnosis not present

## 2019-07-03 DIAGNOSIS — I2510 Atherosclerotic heart disease of native coronary artery without angina pectoris: Secondary | ICD-10-CM | POA: Diagnosis not present

## 2019-07-03 DIAGNOSIS — I10 Essential (primary) hypertension: Secondary | ICD-10-CM | POA: Diagnosis not present

## 2019-07-03 DIAGNOSIS — E119 Type 2 diabetes mellitus without complications: Secondary | ICD-10-CM | POA: Diagnosis not present

## 2019-07-03 NOTE — Progress Notes (Signed)
Spoke with patient, has quarantined since covid 19 testing without fever or symptoms.  Patient verbalized understanding to be NPO after midnight.  Answered questions regarding procedure.  Arrival time [phone]

## 2019-07-03 NOTE — Progress Notes (Signed)
LEFT MESSAGE FOR PATIENT TO CALL BACK FOR HISTORY AND INSTRUCTIONS FOR 07-04-19 PROCEDURE

## 2019-07-04 ENCOUNTER — Encounter (HOSPITAL_COMMUNITY): Payer: Self-pay | Admitting: Gastroenterology

## 2019-07-04 ENCOUNTER — Ambulatory Visit (HOSPITAL_COMMUNITY): Payer: Medicare Other | Admitting: Anesthesiology

## 2019-07-04 ENCOUNTER — Ambulatory Visit (HOSPITAL_COMMUNITY): Payer: Medicare Other

## 2019-07-04 ENCOUNTER — Ambulatory Visit (HOSPITAL_COMMUNITY)
Admission: RE | Admit: 2019-07-04 | Discharge: 2019-07-04 | Disposition: A | Discharge status: Home / Self Care | Payer: Medicare Other | Attending: Gastroenterology | Admitting: Gastroenterology

## 2019-07-04 ENCOUNTER — Encounter (HOSPITAL_COMMUNITY)
Admission: RE | Disposition: A | Discharge status: Home / Self Care | Payer: Self-pay | Source: Home / Self Care | Attending: Gastroenterology

## 2019-07-04 DIAGNOSIS — I129 Hypertensive chronic kidney disease with stage 1 through stage 4 chronic kidney disease, or unspecified chronic kidney disease: Secondary | ICD-10-CM | POA: Insufficient documentation

## 2019-07-04 DIAGNOSIS — E669 Obesity, unspecified: Secondary | ICD-10-CM | POA: Insufficient documentation

## 2019-07-04 DIAGNOSIS — K831 Obstruction of bile duct: Secondary | ICD-10-CM | POA: Diagnosis not present

## 2019-07-04 DIAGNOSIS — Z794 Long term (current) use of insulin: Secondary | ICD-10-CM | POA: Diagnosis not present

## 2019-07-04 DIAGNOSIS — K3189 Other diseases of stomach and duodenum: Secondary | ICD-10-CM | POA: Insufficient documentation

## 2019-07-04 DIAGNOSIS — N189 Chronic kidney disease, unspecified: Secondary | ICD-10-CM | POA: Diagnosis not present

## 2019-07-04 DIAGNOSIS — Z96653 Presence of artificial knee joint, bilateral: Secondary | ICD-10-CM | POA: Insufficient documentation

## 2019-07-04 DIAGNOSIS — K2970 Gastritis, unspecified, without bleeding: Secondary | ICD-10-CM | POA: Diagnosis not present

## 2019-07-04 DIAGNOSIS — K838 Other specified diseases of biliary tract: Secondary | ICD-10-CM | POA: Diagnosis not present

## 2019-07-04 DIAGNOSIS — K219 Gastro-esophageal reflux disease without esophagitis: Secondary | ICD-10-CM | POA: Diagnosis not present

## 2019-07-04 DIAGNOSIS — E1122 Type 2 diabetes mellitus with diabetic chronic kidney disease: Secondary | ICD-10-CM | POA: Insufficient documentation

## 2019-07-04 DIAGNOSIS — R945 Abnormal results of liver function studies: Secondary | ICD-10-CM | POA: Diagnosis not present

## 2019-07-04 DIAGNOSIS — Z853 Personal history of malignant neoplasm of breast: Secondary | ICD-10-CM | POA: Diagnosis not present

## 2019-07-04 DIAGNOSIS — R932 Abnormal findings on diagnostic imaging of liver and biliary tract: Secondary | ICD-10-CM

## 2019-07-04 DIAGNOSIS — R7989 Other specified abnormal findings of blood chemistry: Secondary | ICD-10-CM | POA: Diagnosis not present

## 2019-07-04 DIAGNOSIS — I899 Noninfective disorder of lymphatic vessels and lymph nodes, unspecified: Secondary | ICD-10-CM

## 2019-07-04 DIAGNOSIS — R748 Abnormal levels of other serum enzymes: Secondary | ICD-10-CM

## 2019-07-04 DIAGNOSIS — R933 Abnormal findings on diagnostic imaging of other parts of digestive tract: Secondary | ICD-10-CM | POA: Diagnosis present

## 2019-07-04 DIAGNOSIS — K2980 Duodenitis without bleeding: Secondary | ICD-10-CM | POA: Diagnosis not present

## 2019-07-04 HISTORY — PX: ESOPHAGOGASTRODUODENOSCOPY (EGD) WITH PROPOFOL: SHX5813

## 2019-07-04 HISTORY — PX: BILIARY DILATION: SHX6850

## 2019-07-04 HISTORY — PX: BIOPSY: SHX5522

## 2019-07-04 HISTORY — PX: BILIARY BRUSHING: SHX6843

## 2019-07-04 HISTORY — PX: ENDOSCOPIC RETROGRADE CHOLANGIOPANCREATOGRAPHY (ERCP) WITH PROPOFOL: SHX5810

## 2019-07-04 HISTORY — PX: EUS: SHX5427

## 2019-07-04 LAB — GLUCOSE, CAPILLARY: Glucose-Capillary: 114 mg/dL — ABNORMAL HIGH (ref 70–99)

## 2019-07-04 SURGERY — UPPER ENDOSCOPIC ULTRASOUND (EUS) RADIAL
Anesthesia: General

## 2019-07-04 MED ORDER — GLUCAGON HCL RDNA (DIAGNOSTIC) 1 MG IJ SOLR
INTRAMUSCULAR | Status: AC
  Filled 2019-07-04: qty 1

## 2019-07-04 MED ORDER — INDOMETHACIN 50 MG RE SUPP
RECTAL | Status: DC | PRN
  Administered 2019-07-04: 100 mg via RECTAL

## 2019-07-04 MED ORDER — GLYCOPYRROLATE PF 0.2 MG/ML IJ SOSY
PREFILLED_SYRINGE | INTRAMUSCULAR | Status: DC | PRN
  Administered 2019-07-04: .2 mg via INTRAVENOUS

## 2019-07-04 MED ORDER — DEXAMETHASONE SODIUM PHOSPHATE 10 MG/ML IJ SOLN
INTRAMUSCULAR | Status: DC | PRN
  Administered 2019-07-04: 8 mg via INTRAVENOUS

## 2019-07-04 MED ORDER — SUGAMMADEX SODIUM 200 MG/2ML IV SOLN
INTRAVENOUS | Status: DC | PRN
  Administered 2019-07-04: 200 mg via INTRAVENOUS

## 2019-07-04 MED ORDER — SODIUM CHLORIDE 0.9 % IV SOLN: INTRAVENOUS | Status: DC

## 2019-07-04 MED ORDER — LIDOCAINE 2% (20 MG/ML) 5 ML SYRINGE
INTRAMUSCULAR | Status: DC | PRN
  Administered 2019-07-04: 80 mg via INTRAVENOUS

## 2019-07-04 MED ORDER — PROPOFOL 10 MG/ML IV BOLUS
INTRAVENOUS | Status: AC
  Filled 2019-07-04: qty 40

## 2019-07-04 MED ORDER — CIPROFLOXACIN IN D5W 400 MG/200ML IV SOLN
INTRAVENOUS | Status: DC | PRN
  Administered 2019-07-04: 400 mg via INTRAVENOUS

## 2019-07-04 MED ORDER — PHENYLEPHRINE 40 MCG/ML (10ML) SYRINGE FOR IV PUSH (FOR BLOOD PRESSURE SUPPORT)
PREFILLED_SYRINGE | INTRAVENOUS | Status: DC | PRN
  Administered 2019-07-04: 80 ug via INTRAVENOUS

## 2019-07-04 MED ORDER — FENTANYL CITRATE (PF) 100 MCG/2ML IJ SOLN
INTRAMUSCULAR | Status: AC
  Filled 2019-07-04: qty 2

## 2019-07-04 MED ORDER — GLUCAGON HCL RDNA (DIAGNOSTIC) 1 MG IJ SOLR
INTRAMUSCULAR | Status: DC | PRN
  Administered 2019-07-04 (×3): 0.25 mg via INTRAVENOUS

## 2019-07-04 MED ORDER — ONDANSETRON HCL 4 MG/2ML IJ SOLN
4.0000 mg | Freq: Once | INTRAMUSCULAR | Status: AC | PRN
  Administered 2019-07-04: 4 mg via INTRAVENOUS

## 2019-07-04 MED ORDER — CIPROFLOXACIN IN D5W 400 MG/200ML IV SOLN
INTRAVENOUS | Status: AC
  Filled 2019-07-04: qty 200

## 2019-07-04 MED ORDER — LACTATED RINGERS IV SOLN
INTRAVENOUS | Status: DC
  Administered 2019-07-04 (×2): via INTRAVENOUS

## 2019-07-04 MED ORDER — PROPOFOL 500 MG/50ML IV EMUL
INTRAVENOUS | Status: DC | PRN
  Administered 2019-07-04: 20 ug/kg/min via INTRAVENOUS

## 2019-07-04 MED ORDER — EPHEDRINE SULFATE-NACL 50-0.9 MG/10ML-% IV SOSY
PREFILLED_SYRINGE | INTRAVENOUS | Status: DC | PRN
  Administered 2019-07-04: 5 mg via INTRAVENOUS
  Administered 2019-07-04 (×2): 10 mg via INTRAVENOUS
  Administered 2019-07-04: 5 mg via INTRAVENOUS

## 2019-07-04 MED ORDER — FENTANYL CITRATE (PF) 100 MCG/2ML IJ SOLN
INTRAMUSCULAR | Status: DC | PRN
  Administered 2019-07-04 (×2): 50 ug via INTRAVENOUS

## 2019-07-04 MED ORDER — ROCURONIUM BROMIDE 10 MG/ML (PF) SYRINGE
PREFILLED_SYRINGE | INTRAVENOUS | Status: DC | PRN
  Administered 2019-07-04: 60 mg via INTRAVENOUS

## 2019-07-04 MED ORDER — SODIUM CHLORIDE 0.9 % IV SOLN
INTRAVENOUS | Status: DC | PRN
  Administered 2019-07-04: 12:00:00 30 mL

## 2019-07-04 MED ORDER — INDOMETHACIN 50 MG RE SUPP
RECTAL | Status: AC
  Filled 2019-07-04: qty 2

## 2019-07-04 MED ORDER — PROPOFOL 10 MG/ML IV BOLUS
INTRAVENOUS | Status: DC | PRN
  Administered 2019-07-04: 150 mg via INTRAVENOUS

## 2019-07-04 NOTE — H&P (Signed)
GASTROENTEROLOGY PROCEDURE H&P NOTE   Primary Care Physician: Monico Blitz, MD  HPI: Carol Keith is a 68 y.o. female who presents for EGD/EUS/ERCP.  Past Medical History:  Diagnosis Date  . Allergic rhinitis   . Arthritis    kneees, back   . Bursitis of right hip   . Cancer [facility])    breast cancer, right  . Cerumen impaction   . Chronic kidney disease    appt. schedule for Dr. Lynnette Caffey, october 2020  . Complication of anesthesia   . Diabetes mellitus without complication ([place])    Type II  . Dizziness   . Fatigue   . Frequent urination   . Gout   . Heart murmur    Echo11/21/16 ([facility]): LVEF 50-55%, reduced LV diastolic function, mild TR   . Hypercholesteremia   . Hypertension   . Hypokalemia   . Left foot pain   . Left shoulder pain   . Lumbago   . Oral candida   . Ovarian failure   . Plantar fascial fibromatosis   . PONV (postoperative nausea and vomiting)   . Proteinuria   . Right knee pain    Past Surgical History:  Procedure Laterality Date  . ABDOMINAL HYSTERECTOMY     complete  . APPENDECTOMY    . BILIARY BRUSHING  02/07/2019   Procedure: BILIARY BRUSHING;  Surgeon: Rush Landmark Telford Nab., MD;  Location: [place];  Service: Gastroenterology;;  . BILIARY DILATION  02/07/2019   Procedure: BILIARY DILATION;  Surgeon: Irving Copas., MD;  Location: [place];  Service: Gastroenterology;;  . BIOPSY  02/07/2019   Procedure: BIOPSY;  Surgeon: Irving Copas., MD;  Location: [place];  Service: Gastroenterology;;  . BREAST SURGERY Right    for Breast Ca, tx with surg. & chemo & radiation    . CHOLECYSTECTOMY    . COLONOSCOPY    . ERCP N/A 02/07/2019   Procedure: ENDOSCOPIC RETROGRADE CHOLANGIOPANCREATOGRAPHY (ERCP);  Surgeon: Irving Copas., MD;  Location: [place];  Service: Gastroenterology;  Laterality: N/A;  . ESOPHAGOGASTRODUODENOSCOPY (EGD) WITH PROPOFOL N/A 02/07/2019   Procedure:  ESOPHAGOGASTRODUODENOSCOPY (EGD) WITH PROPOFOL;  Surgeon: Rush Landmark Telford Nab., MD;  Location: [place];  Service: Gastroenterology;  Laterality: N/A;  . JOINT REPLACEMENT Bilateral    knee  . KNEE ARTHROPLASTY Right 05/17/2016   Procedure: RIGHT TOTAL KNEE ARTHROPLASTY WITH COMPUTER NAVIGATION;  Surgeon: Rod Can, MD;  Location: [place];  Service: Orthopedics;  Laterality: Right;  Needs RNFA  . KNEE ARTHROPLASTY Left 01/24/2017   Procedure: LEFT TOTAL KNEE ARTHROPLASTY WITH COMPUTER NAVIGATION;  Surgeon: Rod Can, MD;  Location: [place];  Service: Orthopedics;  Laterality: Left;  Dr. requesting RNFA  . LAPAROSCOPY  1999   done at [facility], "something with separating my organs because things had grown together"  . REMOVAL OF STONES  02/07/2019   Procedure: REMOVAL OF STONES;  Surgeon: Rush Landmark Telford Nab., MD;  Location: [place];  Service: Gastroenterology;;  . [name]  02/07/2019   Procedure: [name];  Surgeon: Irving Copas., MD;  Location: [place];  Service: Gastroenterology;;  . TONSILLECTOMY    . TUBAL LIGATION    . UPPER ESOPHAGEAL ENDOSCOPIC ULTRASOUND (EUS)  02/07/2019   Procedure: UPPER ESOPHAGEAL ENDOSCOPIC ULTRASOUND (EUS);  Surgeon: Irving Copas., MD;  Location: [place];  Service: Gastroenterology;;   Current Facility-Administered Medications  Medication Dose Route Frequency Provider Last Rate Last Dose  . lactated ringers infusion   Intravenous Continuous Mansouraty, Telford Nab., MD      .  ondansetron (ZOFRAN) injection 4 mg  4 mg Intravenous Once PRN Audry Pili, MD       Allergies  Allergen Reactions  . Ace Inhibitors     Cough   . Actos [Pioglitazone]     Edema   . Lipitor [Atorvastatin]     Chest tightness   . Metformin And Related Diarrhea   Family History  Problem Relation Age of Onset  . Breast cancer Sister        age 43   Social History   Socioeconomic History  . Marital status: Married     Spouse name: Not on file  . Number of children: Not on file  . Years of education: Not on file  . Highest education level: Not on file  Occupational History    Comment: [employer]  Social Needs  . Financial resource strain: Not on file  . Food insecurity    Worry: Not on file    Inability: Not on file  . Transportation needs    Medical: Not on file    Non-medical: Not on file  Tobacco Use  . Smoking status: Never Smoker  . Smokeless tobacco: Never Used  Substance and Sexual Activity  . Alcohol use: No  . Drug use: No  . Sexual activity: Not on file  Lifestyle  . Physical activity    Days per week: Not on file    Minutes per session: Not on file  . Stress: Not on file  Relationships  . Social Herbalist on phone: Not on file    Gets together: Not on file    Attends religious service: Not on file    Active member of club or organization: Not on file    Attends meetings of clubs or organizations: Not on file    Relationship status: Not on file  . Intimate partner violence    Fear of current or ex partner: Not on file    Emotionally abused: Not on file    Physically abused: Not on file    Forced sexual activity: Not on file  Other Topics Concern  . Not on file  Social History Narrative  . Not on file    Physical Exam: Vital signs in last 24 hours:     GEN: NAD EYE: Sclerae anicteric ENT: MMM CV: Non-tachycardic GI: Soft, NT/ND NEURO:  Alert & Oriented x 3  Lab Results: No results for input(s): WBC, HGB, HCT, PLT in the last 72 hours. BMET No results for input(s): NA, K, CL, CO2, GLUCOSE, BUN, CREATININE, CALCIUM in the last 72 hours. LFT No results for input(s): PROT, ALBUMIN, AST, ALT, ALKPHOS, BILITOT, BILIDIR, IBILI in the last 72 hours. PT/INR No results for input(s): LABPROT, INR in the last 72 hours.   Impression / Plan: This is a 68 y.o.female who presents for EGD/EUS/ERCP.  The risks and benefits of endoscopic evaluation were  discussed with the patient; these include but are not limited to the risk of perforation, infection, bleeding, missed lesions, lack of diagnosis, severe illness requiring hospitalization, as well as anesthesia and sedation related illnesses.  The patient is agreeable to proceed.   The risks of an ERCP were discussed at length, including but not limited to the risk of perforation, bleeding, abdominal pain, post-ERCP pancreatitis (while usually mild can be severe and even life threatening).  The risks and benefits of endoscopic evaluation were discussed with the patient; these include but are not limited to the risk of  perforation, infection, bleeding, missed lesions, lack of diagnosis, severe illness requiring hospitalization, as well as anesthesia and sedation related illnesses.  The patient is agreeable to proceed.    Justice Britain, MD [facility] Advanced [facility]oscopy Office # [phone]

## 2019-07-04 NOTE — Discharge Instructions (Signed)
YOU HAD AN ENDOSCOPIC PROCEDURE TODAY: Refer to the procedure report and other information in the discharge instructions given to you for any specific questions about what was found during the examination. If this information does not answer your questions, please call [facility] office at [phone] to clarify.   YOU SHOULD EXPECT: Some feelings of bloating in the abdomen. Passage of more gas than usual. Walking can help get rid of the air that was put into your GI tract during the procedure and reduce the bloating. Some abdominal soreness may be present for a day or two, also.  DIET: Your first meal following the procedure should be a light meal and then it is ok to progress to your normal diet. A half-sandwich or bowl of soup is an example of a good first meal. Heavy or fried foods are harder to digest and may make you feel nauseous or bloated. Drink plenty of fluids but you should avoid alcoholic beverages for 24 hours. .    ACTIVITY: Your care partner should take you home directly after the procedure. You should plan to take it easy, moving slowly for the rest of the day. You can resume normal activity the day after the procedure however YOU SHOULD NOT DRIVE, use power tools, machinery or perform tasks that involve climbing or major physical exertion for 24 hours (because of the sedation medicines used during the test).   SYMPTOMS TO REPORT IMMEDIATELY: A gastroenterologist can be reached at any hour. Please call [phone]  for any of the following symptoms:   Following upper endoscopy (EGD, EUS, ERCP, esophageal dilation) Vomiting of blood or coffee ground material  New, significant abdominal pain  New, significant chest pain or pain under the shoulder blades  Painful or persistently difficult swallowing  New shortness of breath  Black, tarry-looking or red, bloody stools  FOLLOW UP:  If any biopsies were taken you will be contacted by phone or by letter within the next 1-3 weeks. Call  [phone]  if you have not heard about the biopsies in 3 weeks.  Please also call with any specific questions about appointments or follow up tests.

## 2019-07-04 NOTE — Anesthesia Postprocedure Evaluation (Signed)
Anesthesia Post Note  Patient: Carol Keith  Procedure(s) Performed: UPPER ENDOSCOPIC ULTRASOUND (EUS) RADIAL (N/A ) ENDOSCOPIC RETROGRADE CHOLANGIOPANCREATOGRAPHY (ERCP) WITH PROPOFOL (N/A ) BIOPSY BILIARY DILATION BILIARY BRUSHING     Patient location during evaluation: PACU Anesthesia Type: General Level of consciousness: awake and alert Pain management: pain level controlled Vital Signs Assessment: post-procedure vital signs reviewed and stable Respiratory status: spontaneous breathing, nonlabored ventilation and respiratory function stable Cardiovascular status: blood pressure returned to baseline and stable Postop Assessment: no apparent nausea or vomiting Anesthetic complications: no    Last Vitals:  Vitals:   07/04/19 1235 07/04/19 1237  BP: (!) 209/74 (!) 157/73  Pulse: 83 85  Resp: 14 19  Temp:    SpO2: 97% 98%    Last Pain:  Vitals:   07/04/19 1157  TempSrc: Oral  PainSc:                  Audry Pili

## 2019-07-04 NOTE — Transfer of Care (Signed)
Immediate Anesthesia Transfer of Care Note  Patient: Carol Keith  Procedure(s) Performed: Procedure(s): UPPER ENDOSCOPIC ULTRASOUND (EUS) RADIAL (N/A) ENDOSCOPIC RETROGRADE CHOLANGIOPANCREATOGRAPHY (ERCP) WITH PROPOFOL (N/A) BIOPSY BILIARY DILATION BILIARY BRUSHING  Patient Location: PACU  Anesthesia Type:General  Level of Consciousness:  sedated, patient cooperative and responds to stimulation  Airway & Oxygen Therapy:Patient Spontanous Breathing and Patient connected to face mask oxgen  Post-op Assessment:  Report given to PACU RN and Post -op Vital signs reviewed and stable  Post vital signs:  Reviewed and stable  Last Vitals:  Vitals:   07/04/19 0834  BP: (!) 165/76  Pulse: 72  Resp: 18  Temp: 36.6 C  SpO2: 563%    Complications: No apparent anesthesia complications

## 2019-07-04 NOTE — Op Note (Signed)
[facility] Patient Name: Carol Keith Procedure Date: 07/04/2019 MRN: [id number] Attending MD: Justice Britain , MD Date of Birth: 1951/05/23 CSN: [id number] Age: 68 Admit Type: Outpatient Procedure:                Upper EUS Indications:              Abnormal endoscopy Providers:                Justice Britain, MD, Cleda Daub, RN, Elspeth Cho Tech., Technician, Lina Sar,                            Technician, Arnoldo Hooker, CRNA Referring MD:             Thornton Park MD, MD Medicines:                General Anesthesia Complications:            No immediate complications. Estimated Blood Loss:     Estimated blood loss was minimal. Procedure:                Pre-Anesthesia Assessment:                           - Prior to the procedure, a History and Physical                            was performed, and patient medications and                            allergies were reviewed. The patient's tolerance of                            previous anesthesia was also reviewed. The risks                            and benefits of the procedure and the sedation                            options and risks were discussed with the patient.                            All questions were answered, and informed consent                            was obtained. Prior Anticoagulants: The patient has                            taken no previous anticoagulant or antiplatelet                            agents except for aspirin. ASA Grade Assessment:                            III -  A patient with severe systemic disease. After                            reviewing the risks and benefits, the patient was                            deemed in satisfactory condition to undergo the                            procedure.                           After obtaining informed consent, the endoscope was                            passed under direct vision.  Throughout the                            procedure, the patient's blood pressure, pulse, and                            oxygen saturations were monitored continuously. The                            TJF-Q180V ([id number]) Olympus duodenoscope was                            introduced through the mouth, and advanced to the                            second part of duodenum. The GF-UTC180 ([id number])                            Olympus Linear EUS was introduced through the                            mouth, and advanced to the duodenum for ultrasound                            examination from the stomach and duodenum. The                            upper EUS was accomplished without difficulty. The                            patient tolerated the procedure. Scope In: Scope Out: Findings:      ENDOSCOPIC FINDING: :      Patchy mildly erythematous mucosa without bleeding was found in the       gastric antrum.      No other gross lesions were noted in the entire examined stomach.      Localized mildly erythematous mucosa without active bleeding was found       in the duodenal bulb.      There was evidence of a patent sphincterotomy at the major papilla. This  was characterized by visible biliary and pancreatic orifices and a long       tail. Biopsies were taken with a cold forceps for histology from the       tail to rule out adenomatous change.      ENDOSONOGRAPHIC FINDING: :      There was dilation in the common bile duct (6.0 mm -> 9.1 mm) and in the       common hepatic duct (12.7 mm).      There was no sign of significant endosonographic abnormality in the       pancreatic head (2.3 mm), genu of the pancreas (1.1 mm -> 0.8 mm),       pancreatic body (0.4 mm) and pancreatic tail (0.6 mm). No masses, no       calcifications, the pancreatic duct was regular in contour.      Endosonographic imaging of the ampulla showed no intramural       (subepithelial) lesion.      No  malignant-appearing lymph nodes were visualized in the celiac region       (level 20), peripancreatic region and porta hepatis region.      The celiac region was visualized. Impression:               EGD Impression:                           - Erythematous mucosa in the antrum.                           - Erythematous duodenopathy.                           - Patent sphincterotomy, characterized by visible                            biliary and pancreatic orifices. Ampullary tail                            biopsied to rule out adenomatous change.                           EUS Impression:                           - There was dilation in the common bile duct and in                            the common hepatic duct.                           - There was no sign of significant pathology in the                            pancreatic head, genu of the pancreas, pancreatic                            body and pancreatic tail.                           -  No malignant-appearing lymph nodes were                            visualized in the celiac region (level 20),                            peripancreatic region and porta hepatis region. Moderate Sedation:      Not Applicable - Patient had care per Anesthesia. Recommendation:           - Proceed to scheduled ERCP.                           - Await path results.                           - Continue current PPI dosing.                           - The findings and recommendations were discussed                            with the patient.                           - The findings and recommendations were discussed                            with the patient's family. Procedure Code(s):        --- Professional ---                           [phone], Esophagogastroduodenoscopy, flexible,                            transoral; with endoscopic ultrasound examination                            limited to the esophagus, stomach or duodenum, and                             adjacent structures                           43239, Esophagogastroduodenoscopy, flexible,                            transoral; with biopsy, single or multiple Diagnosis Code(s):        --- Professional ---                           K31.89, Other diseases of stomach and duodenum                           I89.9, Noninfective disorder of lymphatic vessels                            and lymph nodes, unspecified  K92.9, Disease of digestive system, unspecified                           K83.8, Other specified diseases of biliary tract CPT copyright 2019 American Medical Association. All rights reserved. The codes documented in this report are preliminary and upon coder review may  be revised to meet current compliance requirements. Justice Britain, MD 07/04/2019 12:16:42 PM Number of Addenda: 0

## 2019-07-04 NOTE — Op Note (Signed)
[facility] Patient Name: Carol Keith Procedure Date: 07/04/2019 MRN: [id number] Attending MD: Gabriel Mansouraty , MD Date of Birth: 01/20/1951 CSN: [id number] Age: 68 Admit Type: Outpatient Procedure:                ERCP Indications:              Common bile duct stricture previously noted s/p                            dilation with atypical biliary brushings found,                            Prior Endoscopic Retrograde                            Cholangiopancreatography, Negative imaging post for                            pancreatic mass/lesion and EUS just performed Providers:                Gabriel Mansouraty, MD, Patricia Ford, RN, Chris                            Chandler Tech., Technician, Justin Carter,                            Technician, Logan Key, CRNA Referring MD:             [name] Beavers MD, MD Medicines:                General Anesthesia, Indomethacin 100 mg PR, Cipro                            400 mg IV, Glucagon 0.75 mg IV Complications:            No immediate complications. Estimated Blood Loss:     Estimated blood loss was minimal. Procedure:                Pre-Anesthesia Assessment:                           - Prior to the procedure, a History and Physical                            was performed, and patient medications and                            allergies were reviewed. The patient's tolerance of                            previous anesthesia was also reviewed. The risks                            and benefits of the procedure and the sedation                              options and risks were discussed with the patient.                            All questions were answered, and informed consent                            was obtained. Prior Anticoagulants: The patient has                            taken no previous anticoagulant or antiplatelet                            agents except for aspirin. ASA Grade Assessment:                       III - A patient with severe systemic disease. After                            reviewing the risks and benefits, the patient was                            deemed in satisfactory condition to undergo the                            procedure.                           After obtaining informed consent, the scope was                            passed under direct vision. Throughout the                            procedure, the patient's blood pressure, pulse, and                            oxygen saturations were monitored continuously. The                            TJF-Q180V ([id number]) Olympus duodenoscope was                            introduced through the mouth, and used to inject                            contrast into and used to inject contrast into the                            bile duct. The ERCP was accomplished without                            difficulty. The patient tolerated the procedure. Scope In: Scope Out: Findings:      The scout film was normal.      The esophagus was successfully intubated under  direct vision without       detailed examination of the pharynx, larynx, and associated structures,       and upper GI tract. A biliary sphincterotomy had been performed and was       open. The pancreatic orifice could be visualized on today's examination.       As previously noted in EUS the ampulla had a tail that was biopsied       during EUS.      A short 0.035 inch Soft Jagwire was passed into the biliary tree. The       Autotome sphincterotome was passed over the guidewire and the bile duct       was then deeply cannulated. Contrast was injected. I personally       interpreted the bile duct images. Ductal flow of contrast was adequate.       Image quality was adequate. Contrast extended to the hepatic ducts.       Opacification of the entire biliary tree except for the cystic duct and       gallbladder was successful. The main bile duct was  moderately dilated.       The largest diameter was 11 mm. The lower third of the main bile duct       was successfully dilated with an 05-19-09 mm x 3 cm CRE balloon (to a       maximum balloon size of 10 mm) dilator as a sphincteroplasty for       4-minutes to ensure that future drainage would remain intact. There was       mild post-sphincteroplasty oozing that subsided on its own without       intervention. To discover objects, the biliary tree was swept with a       retrieval balloon starting at the bifurcation. Small amount of sludge       was swept from the duct. An occlusion cholangiogram was performed that       showed no further significant biliary pathology. Cells for cytology were       obtained by brushing in the lower third of the main bile duct. The       endoscope could look into the CBD without issue at the end of today's       procedure and I saw no further evidence of a stenosis or abnormal tissue       other than what was just dilated as a sphincteroplasty.      A pancreatogram was not performed.      The duodenoscope was withdrawn from the patient. Impression:               - Prior biliary sphincterotomy appeared open.                           - The entire main bile duct was moderately dilated.                           - Sphincteroplasty was performed.                           - The biliary tree was swept and small amount of                            sludge was found.                           -   Cells for cytology obtained in the lower third of                            the main duct. Moderate Sedation:      Not Applicable - Patient had care per Anesthesia. Recommendation:           - The patient will be observed post-procedure,                            until all discharge criteria are met.                           - Discharge patient to home.                           - Patient has a contact number available for                            emergencies. The signs  and symptoms of potential                            delayed complications were discussed with the                            patient. Return to normal activities tomorrow.                            Written discharge instructions were provided to the                            patient.                           - Observe patient's clinical course.                           - Watch for pancreatitis, bleeding, perforation,                            and cholangitis.                           - May restart Aspirin tomorrow.                           - HFP in 2-weeks to be done in clinic.                           - Await cytology results.                           - Follow up with Dr. Beavers as needed.                           - If patient has evidence of adenomatous tissue on                              the tail of the ampulla then will need to discuss                            possible role of Ampullectomy for patient. If                            biopsies and cytology are unremarkable, then no                            further workup/management will be required at this                            time.                           - The findings and recommendations were discussed                            with the patient.                           - The findings and recommendations were discussed                            with the patient's family. Procedure Code(s):        --- Professional ---                           [phone], Endoscopic retrograde                            cholangiopancreatography (ERCP); with removal of                            calculi/debris from biliary/pancreatic duct(s) Diagnosis Code(s):        --- Professional ---                           K83.1, Obstruction of bile duct                           Z98.890, Other specified postprocedural states                           K83.8, Other specified diseases of biliary tract CPT copyright 2019 American Medical  Association. All rights reserved. The codes documented in this report are preliminary and upon coder review may  be revised to meet current compliance requirements. Justice Britain, MD 07/04/2019 12:30:13 PM Number of Addenda: 0

## 2019-07-04 NOTE — H&P (Signed)
GASTROENTEROLOGY PROCEDURE H&P NOTE   Primary Care Physician: Monico Blitz, MD  HPI: Carol Keith is a 68 y.o. female who presents for EUS/ERCP  Past Medical History:  Diagnosis Date  . Allergic rhinitis   . Arthritis    kneees, back   . Bursitis of right hip   . Cancer [facility])    breast cancer, right  . Cerumen impaction   . Chronic kidney disease    appt. schedule for Dr. Lynnette Caffey, october 2020  . Complication of anesthesia   . Diabetes mellitus without complication ([place])    Type II  . Dizziness   . Fatigue   . Frequent urination   . Gout   . Heart murmur    Echo11/21/16 ([facility]): LVEF 50-55%, reduced LV diastolic function, mild TR   . Hypercholesteremia   . Hypertension   . Hypokalemia   . Left foot pain   . Left shoulder pain   . Lumbago   . Oral candida   . Ovarian failure   . Plantar fascial fibromatosis   . PONV (postoperative nausea and vomiting)   . Proteinuria   . Right knee pain    Past Surgical History:  Procedure Laterality Date  . ABDOMINAL HYSTERECTOMY     complete  . APPENDECTOMY    . BILIARY BRUSHING  02/07/2019   Procedure: BILIARY BRUSHING;  Surgeon: Rush Landmark Telford Nab., MD;  Location: [place];  Service: Gastroenterology;;  . BILIARY DILATION  02/07/2019   Procedure: BILIARY DILATION;  Surgeon: Irving Copas., MD;  Location: [place];  Service: Gastroenterology;;  . BIOPSY  02/07/2019   Procedure: BIOPSY;  Surgeon: Irving Copas., MD;  Location: [place];  Service: Gastroenterology;;  . BREAST SURGERY Right    for Breast Ca, tx with surg. & chemo & radiation    . CHOLECYSTECTOMY    . COLONOSCOPY    . ERCP N/A 02/07/2019   Procedure: ENDOSCOPIC RETROGRADE CHOLANGIOPANCREATOGRAPHY (ERCP);  Surgeon: Irving Copas., MD;  Location: [place];  Service: Gastroenterology;  Laterality: N/A;  . ESOPHAGOGASTRODUODENOSCOPY (EGD) WITH PROPOFOL N/A 02/07/2019   Procedure: ESOPHAGOGASTRODUODENOSCOPY  (EGD) WITH PROPOFOL;  Surgeon: Rush Landmark Telford Nab., MD;  Location: [place];  Service: Gastroenterology;  Laterality: N/A;  . JOINT REPLACEMENT Bilateral    knee  . KNEE ARTHROPLASTY Right 05/17/2016   Procedure: RIGHT TOTAL KNEE ARTHROPLASTY WITH COMPUTER NAVIGATION;  Surgeon: Rod Can, MD;  Location: [place];  Service: Orthopedics;  Laterality: Right;  Needs RNFA  . KNEE ARTHROPLASTY Left 01/24/2017   Procedure: LEFT TOTAL KNEE ARTHROPLASTY WITH COMPUTER NAVIGATION;  Surgeon: Rod Can, MD;  Location: [place];  Service: Orthopedics;  Laterality: Left;  Dr. requesting RNFA  . LAPAROSCOPY  1999   done at [facility], "something with separating my organs because things had grown together"  . REMOVAL OF STONES  02/07/2019   Procedure: REMOVAL OF STONES;  Surgeon: Rush Landmark Telford Nab., MD;  Location: [place];  Service: Gastroenterology;;  . [name]  02/07/2019   Procedure: [name];  Surgeon: Irving Copas., MD;  Location: [place];  Service: Gastroenterology;;  . TONSILLECTOMY    . TUBAL LIGATION    . UPPER ESOPHAGEAL ENDOSCOPIC ULTRASOUND (EUS)  02/07/2019   Procedure: UPPER ESOPHAGEAL ENDOSCOPIC ULTRASOUND (EUS);  Surgeon: Irving Copas., MD;  Location: [place];  Service: Gastroenterology;;   Current Facility-Administered Medications  Medication Dose Route Frequency Provider Last Rate Last Dose  . lactated ringers infusion   Intravenous Continuous Mansouraty, Telford Nab., MD 20  mL/hr at 07/04/19 0854    . ondansetron (ZOFRAN) injection 4 mg  4 mg Intravenous Once PRN Audry Pili, MD       Allergies  Allergen Reactions  . Ace Inhibitors     Cough   . Actos [Pioglitazone]     Edema   . Lipitor [Atorvastatin]     Chest tightness   . Metformin And Related Diarrhea   Family History  Problem Relation Age of Onset  . Breast cancer Sister        age 33   Social History   Socioeconomic History  . Marital status: Married     Spouse name: Not on file  . Number of children: Not on file  . Years of education: Not on file  . Highest education level: Not on file  Occupational History    Comment: [employer]  Social Needs  . Financial resource strain: Not on file  . Food insecurity    Worry: Not on file    Inability: Not on file  . Transportation needs    Medical: Not on file    Non-medical: Not on file  Tobacco Use  . Smoking status: Never Smoker  . Smokeless tobacco: Never Used  Substance and Sexual Activity  . Alcohol use: No  . Drug use: No  . Sexual activity: Not on file  Lifestyle  . Physical activity    Days per week: Not on file    Minutes per session: Not on file  . Stress: Not on file  Relationships  . Social Herbalist on phone: Not on file    Gets together: Not on file    Attends religious service: Not on file    Active member of club or organization: Not on file    Attends meetings of clubs or organizations: Not on file    Relationship status: Not on file  . Intimate partner violence    Fear of current or ex partner: Not on file    Emotionally abused: Not on file    Physically abused: Not on file    Forced sexual activity: Not on file  Other Topics Concern  . Not on file  Social History Narrative  . Not on file    Physical Exam: Vital signs in last 24 hours: Temp:  [97.9 F (36.6 C)] 97.9 F (36.6 C) (09/23 0834) Pulse Rate:  [72] 72 (09/23 0834) Resp:  [18] 18 (09/23 0834) BP: (165)/(76) 165/76 (09/23 0834) SpO2:  [100 %] 100 % (09/23 0834) Weight:  [86.2 kg] 86.2 kg (09/23 0851)   GEN: NAD EYE: Sclerae anicteric ENT: MMM CV: Non-tachycardic GI: Soft, NT/ND NEURO:  Alert & Oriented x 3  Lab Results: No results for input(s): WBC, HGB, HCT, PLT in the last 72 hours. BMET No results for input(s): NA, K, CL, CO2, GLUCOSE, BUN, CREATININE, CALCIUM in the last 72 hours. LFT No results for input(s): PROT, ALBUMIN, AST, ALT, ALKPHOS, BILITOT, BILIDIR, IBILI  in the last 72 hours. PT/INR No results for input(s): LABPROT, INR in the last 72 hours.   Impression / Plan: This is a 68 y.o.female who presents for EUS/ERCP.  The risks of EUS including bleeding, infection, aspiration pneumonia and intestinal perforation were discussed as was the possibility it may not give a definitive diagnosis.  If a biopsy of the pancreas is done as part of the EUS, there is an additional risk of pancreatitis at the rate of about 1%.  It was  explained that procedure related pancreatitis is typically mild, although can be severe and even life threatening, which is why we do not perform random pancreatic biopsies and only biopsy a lesion we feel is concerning enough to warrant the risk.  The risks of an ERCP were discussed at length, including but not limited to the risk of perforation, bleeding, abdominal pain, post-ERCP pancreatitis (while usually mild can be severe and even life threatening).  The risks and benefits of endoscopic evaluation were discussed with the patient; these include but are not limited to the risk of perforation, infection, bleeding, missed lesions, lack of diagnosis, severe illness requiring hospitalization, as well as anesthesia and sedation related illnesses.  The patient is agreeable to proceed.    Justice Britain, MD [facility]oscopy Office # [phone]

## 2019-07-04 NOTE — Anesthesia Procedure Notes (Signed)
Procedure Name: Intubation Date/Time: 07/04/2019 10:39 AM Performed by: Lavina Hamman, CRNA Pre-anesthesia Checklist: Patient identified, Emergency Drugs available, Suction available, Patient being monitored and Timeout performed Patient Re-evaluated:Patient Re-evaluated prior to induction Oxygen Delivery Method: Circle system utilized Preoxygenation: Pre-oxygenation with 100% oxygen Induction Type: IV induction Ventilation: Mask ventilation without difficulty Laryngoscope Size: Mac and 3 Grade View: Grade II Tube type: Oral Tube size: 7.0 mm Number of attempts: 1 Airway Equipment and Method: Stylet Placement Confirmation: ETT inserted through vocal cords under direct vision,  positive ETCO2,  CO2 detector and breath sounds checked- equal and bilateral Secured at: 21 cm Tube secured with: Tape Dental Injury: Teeth and Oropharynx as per pre-operative assessment

## 2019-07-04 NOTE — Anesthesia Preprocedure Evaluation (Addendum)
Anesthesia Evaluation  Patient identified by MRN, date of birth, ID band Patient awake    Reviewed: Allergy & Precautions, NPO status , Patient's Chart, lab work & pertinent test results, reviewed documented beta blocker date and time   History of Anesthesia Complications (+) PONV and history of anesthetic complications  Airway Mallampati: II  TM Distance: >3 FB Neck ROM: Full    Dental  (+) Dental Advisory Given, Teeth Intact   Pulmonary neg pulmonary ROS,    Pulmonary exam normal        Cardiovascular hypertension, Pt. on medications and Pt. on home beta blockers (-) anginaNormal cardiovascular exam   Echo 09/01/15 ([facility], scanned under Media tab, Correspondence, 01/24/17): The LV is normal in size and shape. The LV EF is estimated at 50-55%. The LV diastolic function is reduced. Consider diastolic dysfunction. Mild TR.    Neuro/Psych negative neurological ROS  negative psych ROS   GI/Hepatic Neg liver ROS, GERD  Controlled and Medicated,  Endo/Other  diabetes, Type 2, Insulin Dependent Obesity   Renal/GU CRFRenal disease     Musculoskeletal  (+) Arthritis ,  Gout    Abdominal   Peds  Hematology negative hematology ROS (+)   Anesthesia Other Findings   Reproductive/Obstetrics  Breast cancer                             Anesthesia Physical Anesthesia Plan  ASA: III  Anesthesia Plan: General   Post-op Pain Management:    Induction: Intravenous  PONV Risk Score and Plan: 4 or greater and Treatment may vary due to age or medical condition, Ondansetron, Propofol infusion and Dexamethasone  Airway Management Planned: Oral ETT  Additional Equipment: None  Intra-op Plan:   Post-operative Plan: Extubation in OR  Informed Consent: I have reviewed the patients History and Physical, chart, labs and discussed the procedure including the risks, benefits and alternatives for the  proposed anesthesia with the patient or authorized representative who has indicated his/her understanding and acceptance.     Dental advisory given  Plan Discussed with: CRNA and Anesthesiologist  Anesthesia Plan Comments:        Anesthesia Quick Evaluation

## 2019-07-05 ENCOUNTER — Encounter (HOSPITAL_COMMUNITY): Payer: Self-pay | Admitting: Gastroenterology

## 2019-07-06 LAB — CYTOLOGY - NON PAP

## 2019-07-06 LAB — SURGICAL PATHOLOGY

## 2019-07-09 ENCOUNTER — Encounter: Payer: Self-pay | Admitting: Gastroenterology

## 2019-07-09 ENCOUNTER — Other Ambulatory Visit: Payer: Self-pay

## 2019-07-09 DIAGNOSIS — K838 Other specified diseases of biliary tract: Secondary | ICD-10-CM

## 2019-07-16 DIAGNOSIS — H40013 Open angle with borderline findings, low risk, bilateral: Secondary | ICD-10-CM | POA: Diagnosis not present

## 2019-07-16 DIAGNOSIS — H25813 Combined forms of age-related cataract, bilateral: Secondary | ICD-10-CM | POA: Diagnosis not present

## 2019-07-23 DIAGNOSIS — E1165 Type 2 diabetes mellitus with hyperglycemia: Secondary | ICD-10-CM | POA: Diagnosis not present

## 2019-07-23 DIAGNOSIS — Z299 Encounter for prophylactic measures, unspecified: Secondary | ICD-10-CM | POA: Diagnosis not present

## 2019-07-23 DIAGNOSIS — Z6835 Body mass index (BMI) 35.0-35.9, adult: Secondary | ICD-10-CM | POA: Diagnosis not present

## 2019-07-23 DIAGNOSIS — Z713 Dietary counseling and surveillance: Secondary | ICD-10-CM | POA: Diagnosis not present

## 2019-07-23 DIAGNOSIS — I10 Essential (primary) hypertension: Secondary | ICD-10-CM | POA: Diagnosis not present

## 2019-07-27 DIAGNOSIS — Z23 Encounter for immunization: Secondary | ICD-10-CM | POA: Diagnosis not present

## 2019-07-30 DIAGNOSIS — H25812 Combined forms of age-related cataract, left eye: Secondary | ICD-10-CM | POA: Diagnosis not present

## 2019-07-30 DIAGNOSIS — E119 Type 2 diabetes mellitus without complications: Secondary | ICD-10-CM | POA: Diagnosis not present

## 2019-07-30 DIAGNOSIS — I10 Essential (primary) hypertension: Secondary | ICD-10-CM | POA: Diagnosis not present

## 2019-07-30 DIAGNOSIS — E7800 Pure hypercholesterolemia, unspecified: Secondary | ICD-10-CM | POA: Diagnosis not present

## 2019-07-30 DIAGNOSIS — I2510 Atherosclerotic heart disease of native coronary artery without angina pectoris: Secondary | ICD-10-CM | POA: Diagnosis not present

## 2019-07-30 NOTE — Patient Instructions (Signed)
Your procedure is scheduled on:   08/06/2019              Report to [facility] at 10:35    AM.  Call this number if you have problems the morning of surgery: [phone]   Do not eat or drink :After Midnight.    Take these medicines the morning of surgery with A SIP OF WATER:Protonix, Metoprolol, losartan, Amlodipine, and Flonase.                        Take only 1/2 dose of Toujeo insulin 22.5 units the night before surgery           Do not wear jewelry, make-up or nail polish.  Do not wear lotions, powders, or perfumes. You may wear deodorant.  Do not bring valuables to the hospital.  Contacts, dentures or bridgework may not be worn into surgery.  Patients discharged the day of surgery will not be allowed to drive home.  Name and phone number of your driver.                                                                                                                                       Cataract Surgery  A cataract is a clouding of the lens of the eye. When a lens becomes cloudy, vision is reduced based on the degree and nature of the clouding. Surgery may be needed to improve vision. Surgery removes the cloudy lens and usually replaces it with a substitute lens (intraocular lens, IOL). LET YOUR EYE DOCTOR KNOW ABOUT:  Allergies to food or medicine.   Medicines taken including herbs, eyedrops, over-the-counter medicines, and creams.   Use of steroids (by mouth or creams).   Previous problems with anesthetics or numbing medicine.   History of bleeding problems or blood clots.   Previous surgery.   Other health problems, including diabetes and kidney problems.   Possibility of pregnancy, if this applies.  RISKS AND COMPLICATIONS  Infection.   Inflammation of the eyeball (endophthalmitis) that can spread to both eyes (sympathetic ophthalmia).   Poor wound healing.   If an IOL is inserted, it can later fall out of proper position. This is very uncommon.   Clouding of  the part of your eye that holds an IOL in place. This is called an "after-cataract." These are uncommon, but easily treated.  BEFORE THE PROCEDURE  Do not eat or drink anything except small amounts of water for 8 to 12 before your surgery, or as directed by your caregiver.    Unless you are told otherwise, continue any eyedrops you have been prescribed.   Talk to your primary caregiver about all other medicines that you take (both prescription and non-prescription). In some cases, you may need to stop or change medicines near the time of your surgery. This is most important if you are taking  blood-thinning medicine. Do not stop medicines unless you are told to do so.   Arrange for someone to drive you to and from the procedure.   Do not put contact lenses in either eye on the day of your surgery.  PROCEDURE There is more than one method for safely removing a cataract. Your doctor can explain the differences and help determine which is best for you. Phacoemulsification surgery is the most common form of cataract surgery.  An injection is given behind the eye or eyedrops are given to make this a painless procedure.   A small cut (incision) is made on the edge of the clear, dome-shaped surface that covers the front of the eye (cornea).   A tiny probe is painlessly inserted into the eye. This device gives off ultrasound waves that soften and break up the cloudy center of the lens. This makes it easier for the cloudy lens to be removed by suction.   An IOL may be implanted.   The normal lens of the eye is covered by a clear capsule. Part of that capsule is intentionally left in the eye to support the IOL.   Your surgeon may or may not use stitches to close the incision.  There are other forms of cataract surgery that require a larger incision and stiches to close the eye. This approach is taken in cases where the doctor feels that the cataract cannot be easily removed using phacoemulsification.  AFTER THE PROCEDURE  When an IOL is implanted, it does not need care. It becomes a permanent part of your eye and cannot be seen or felt.   Your doctor will schedule follow-up exams to check on your progress.   Review your other medicines with your doctor to see which can be resumed after surgery.   Use eyedrops or take medicine as prescribed by your doctor.  Document Released: 09/16/2011 Document Reviewed: 09/13/2011 [facility] Patient Information 2012 [place].  .Cataract Surgery Care After Refer to this sheet in the next few weeks. These instructions provide you with information on caring for yourself after your procedure. Your caregiver may also give you more specific instructions. Your treatment has been planned according to current medical practices, but problems sometimes occur. Call your caregiver if you have any problems or questions after your procedure.  HOME CARE INSTRUCTIONS   Avoid strenuous activities as directed by your caregiver.   Ask your caregiver when you can resume driving.   Use eyedrops or other medicines to help healing and control pressure inside your eye as directed by your caregiver.   Only take over-the-counter or prescription medicines for pain, discomfort, or fever as directed by your caregiver.   Do not to touch or rub your eyes.   You may be instructed to use a protective shield during the first few days and nights after surgery. If not, wear sunglasses to protect your eyes. This is to protect the eye from pressure or from being accidentally bumped.   Keep the area around your eye clean and dry. Avoid swimming or allowing water to hit you directly in the face while showering. Keep soap and shampoo out of your eyes.   Do not bend or lift heavy objects. Bending increases pressure in the eye. You can walk, climb stairs, and do light household chores.   Do not put a contact lens into the eye that had surgery until your caregiver says it is okay to  do so.   Ask your doctor when you  can return to work. This will depend on the kind of work that you do. If you work in a dusty environment, you may be advised to wear protective eyewear for a period of time.   Ask your caregiver when it will be safe to engage in sexual activity.   Continue with your regular eye exams as directed by your caregiver.  What to expect:  It is normal to feel itching and mild discomfort for a few days after cataract surgery. Some fluid discharge is also common, and your eye may be sensitive to light and touch.   After 1 to 2 days, even moderate discomfort should disappear. In most cases, healing will take about 6 weeks.   If you received an intraocular lens (IOL), you may notice that colors are very bright or have a blue tinge. Also, if you have been in bright sunlight, everything may appear reddish for a few hours. If you see these color tinges, it is because your lens is clear and no longer cloudy. Within a few months after receiving an IOL, these extra colors should go away. When you have healed, you will probably need new glasses.  SEEK MEDICAL CARE IF:   You have increased bruising around your eye.   You have discomfort not helped by medicine.  SEEK IMMEDIATE MEDICAL CARE IF:   You have a  fever.   You have a worsening or sudden vision loss.   You have redness, swelling, or increasing pain in the eye.   You have a thick discharge from the eye that had surgery.  MAKE SURE YOU:  Understand these instructions.   Will watch your condition.   Will get help right away if you are not doing well or get worse.  Document Released: 04/16/2005 Document Revised: 09/16/2011 Document Reviewed: 05/21/2011 [facility] Patient Information 2012 [place].    Monitored Anesthesia Care  Monitored anesthesia care is an anesthesia service for a medical procedure. Anesthesia is the loss of the ability to feel pain. It is produced by medications called anesthetics.  It may affect a small area of your body (local anesthesia), a large area of your body (regional anesthesia), or your entire body (general anesthesia). The need for monitored anesthesia care depends your procedure, your condition, and the potential need for regional or general anesthesia. It is often provided during procedures where:   General anesthesia may be needed if there are complications. This is because you need special care when you are under general anesthesia.    You will be under local or regional anesthesia. This is so that you are able to have higher levels of anesthesia if needed.    You will receive calming medications (sedatives). This is especially the case if sedatives are given to put you in a semi-conscious state of relaxation (deep sedation). This is because the amount of sedative needed to produce this state can be hard to predict. Too much of a sedative can produce general anesthesia. Monitored anesthesia care is performed by one or more caregivers who have special training in all types of anesthesia. You will need to meet with these caregivers before your procedure. During this meeting, they will ask you about your medical history. They will also give you instructions to follow. (For example, you will need to stop eating and drinking before your procedure. You may also need to stop or change medications you are taking.) During your procedure, your caregivers will stay with you. They will:   Watch your condition.  This includes watching you blood pressure, breathing, and level of pain.    Diagnose and treat problems that occur.    Give medications if they are needed. These may include calming medications (sedatives) and anesthetics.    Make sure you are comfortable.   Having monitored anesthesia care does not necessarily mean that you will be under anesthesia. It does mean that your caregivers will be able to manage anesthesia if you need it or if it occurs. It also means that  you will be able to have a different type of anesthesia than you are having if you need it. When your procedure is complete, your caregivers will continue to watch your condition. They will make sure any medications wear off before you are allowed to go home.  Document Released: 06/23/2005 Document Revised: 01/22/2013 Document Reviewed: 11/08/2012 [facility] Patient Information 2014 [place], [place].

## 2019-07-31 NOTE — H&P (Signed)
Surgical History & Physical  Patient Name: Carol Keith DOB: [birth year]  Surgery: Cataract extraction with intraocular lens implant phacoemulsification; Left Eye  Surgeon: Baruch Goldmann MD Surgery Date:  08/06/2019 Pre-Op Date:  07/16/2019  HPI: A 10 Yr. old female patient 1. 1. The patient complains of difficulty when viewing TV, reading closed caption, news scrolls on TV, which began 2 years ago. Both eyes are affected. The episode is constant and gradual. The patient describes dull, foggy and hazy symptoms affecting their eyes/vision. The condition's severity decreased since last visit. Symptoms occur when the patient is reading. Reading is worse but also notices distance [place] blurred when driving. This is negatively affecting the patient's quality of life. No significant or sudden changes. Patient did have new glasses in the last year which did not seem to help. Patient has no complaint of glare or halo. Patient interested in considering cataract surgery for BCVA. OS seems worse c/w OD per patient. HPI was performed by Baruch Goldmann .  Medical History: Cataracts Arthritis Cancer Diabetes Gout Allergies/Sinus Depression Heart Problem High Blood Pressure LDL  Review of Systems Musculoskeletal Joint Ache All recorded systems are negative except as noted above.  Social   Never smoked    Alcohol Never  Medication Prednisolone,  Allopurinol, Amlodipine Besylate, Aspirin, Bumetanide, Bydureon, Cetirizine, Diclofenac, Doxazosin, Fluticasone, Losartan Potassium, Metoprolol, Pantoprazole, Polyethlene glycol, Potassium chloride, Rosuvastatin, Salonpas, Tizandine, Toujeo,   Sx/Procedures Hysterectomy, Appendectomy, Lumpectomy (Breast),   Drug Allergies  Lipitor, Actos, Ace inhibitors, Metformin,   History & Physical: Heent:  Cataract, Left eye NECK: supple without bruits LUNGS: lungs clear to auscultation CV: regular rate and rhythm Abdomen: soft and non-tender  Impression &  Plan: Assessment: 1.  COMBINED FORMS AGE RELATED CATARACT; Both Eyes (H25.813) 2.  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  Cataract accounts for the patient's decreased vision. This visual impairment is not correctable with a tolerable change in glasses or contact lenses. Cataract surgery with an implantation of a new lens should significantly improve the visual and functional status of the patient. Discussed all risks, benefits, alternatives, and potential complications. Discussed the procedures and recovery. Patient desires to have surgery. A-scan ordered and performed today for intra-ocular lens calculations. The surgery will be performed in order to improve vision for driving, reading, and for eye examinations. Recommend phacoemulsification with intra-ocular lens. Left Eye worse - first. Dilates well - shugarcaine by protocol. Discussed multifocal - declined. 2.  Based on cup-to-disc ratio. Negative Family history. OCT rFNL today shows: WNL OU. Detailed discussion about glaucoma today including importance of maintaining good follow up and following treatment plan, and the possibility of irreversible blindness as part of this disease process.

## 2019-08-02 ENCOUNTER — Other Ambulatory Visit (HOSPITAL_COMMUNITY)
Admission: RE | Admit: 2019-08-02 | Discharge: 2019-08-02 | Disposition: A | Discharge status: Home / Self Care | Payer: Medicare Other | Source: Ambulatory Visit | Attending: Ophthalmology | Admitting: Ophthalmology

## 2019-08-02 ENCOUNTER — Other Ambulatory Visit: Payer: Self-pay

## 2019-08-02 ENCOUNTER — Encounter (HOSPITAL_COMMUNITY)
Admission: RE | Admit: 2019-08-02 | Discharge: 2019-08-02 | Disposition: A | Discharge status: Home / Self Care | Payer: Medicare Other | Source: Ambulatory Visit | Attending: Ophthalmology | Admitting: Ophthalmology

## 2019-08-02 DIAGNOSIS — Z01812 Encounter for preprocedural laboratory examination: Secondary | ICD-10-CM | POA: Diagnosis not present

## 2019-08-02 DIAGNOSIS — Z20828 Contact with and (suspected) exposure to other viral communicable diseases: Secondary | ICD-10-CM | POA: Insufficient documentation

## 2019-08-02 LAB — COMPREHENSIVE METABOLIC PANEL
ALT: 21 U/L (ref 0–44)
AST: 23 U/L (ref 15–41)
Albumin: 4 g/dL (ref 3.5–5.0)
Alkaline Phosphatase: 69 U/L (ref 38–126)
Anion gap: 9 (ref 5–15)
BUN: 16 mg/dL (ref 8–23)
CO2: 24 mmol/L (ref 22–32)
Calcium: 10 mg/dL (ref 8.9–10.3)
Chloride: 108 mmol/L (ref 98–111)
Creatinine, Ser: 1.31 mg/dL — ABNORMAL HIGH (ref 0.44–1.00)
GFR calc Af Amer: 48 mL/min — ABNORMAL LOW (ref >60)
GFR calc non Af Amer: 41 mL/min — ABNORMAL LOW (ref >60)
Glucose, Bld: 129 mg/dL — ABNORMAL HIGH (ref 70–99)
Potassium: 3.6 mmol/L (ref 3.5–5.1)
Sodium: 141 mmol/L (ref 135–145)
Total Bilirubin: 0.4 mg/dL (ref 0.3–1.2)
Total Protein: 6.8 g/dL (ref 6.5–8.1)

## 2019-08-02 LAB — HEMOGLOBIN A1C
Hgb A1c MFr Bld: 6.6 % — ABNORMAL HIGH (ref 4.8–5.6)
Mean Plasma Glucose: 142.72 mg/dL

## 2019-08-02 LAB — SARS CORONAVIRUS 2 (TAT 6-24 HRS): SARS Coronavirus 2: NEGATIVE

## 2019-08-06 ENCOUNTER — Other Ambulatory Visit: Payer: Self-pay

## 2019-08-06 ENCOUNTER — Ambulatory Visit (HOSPITAL_COMMUNITY): Payer: Medicare Other | Admitting: Anesthesiology

## 2019-08-06 ENCOUNTER — Encounter (HOSPITAL_COMMUNITY)
Admission: RE | Disposition: A | Discharge status: Home / Self Care | Payer: Self-pay | Source: Home / Self Care | Attending: Ophthalmology

## 2019-08-06 ENCOUNTER — Encounter (HOSPITAL_COMMUNITY): Payer: Self-pay | Admitting: Anesthesiology

## 2019-08-06 ENCOUNTER — Ambulatory Visit (HOSPITAL_COMMUNITY)
Admission: RE | Admit: 2019-08-06 | Discharge: 2019-08-06 | Disposition: A | Discharge status: Home / Self Care | Payer: Medicare Other | Attending: Ophthalmology | Admitting: Ophthalmology

## 2019-08-06 DIAGNOSIS — Z7982 Long term (current) use of aspirin: Secondary | ICD-10-CM | POA: Insufficient documentation

## 2019-08-06 DIAGNOSIS — I10 Essential (primary) hypertension: Secondary | ICD-10-CM | POA: Insufficient documentation

## 2019-08-06 DIAGNOSIS — Z79899 Other long term (current) drug therapy: Secondary | ICD-10-CM | POA: Insufficient documentation

## 2019-08-06 DIAGNOSIS — H25812 Combined forms of age-related cataract, left eye: Secondary | ICD-10-CM | POA: Diagnosis not present

## 2019-08-06 DIAGNOSIS — E119 Type 2 diabetes mellitus without complications: Secondary | ICD-10-CM | POA: Diagnosis not present

## 2019-08-06 DIAGNOSIS — H25813 Combined forms of age-related cataract, bilateral: Secondary | ICD-10-CM | POA: Diagnosis not present

## 2019-08-06 DIAGNOSIS — Z794 Long term (current) use of insulin: Secondary | ICD-10-CM | POA: Insufficient documentation

## 2019-08-06 DIAGNOSIS — E1136 Type 2 diabetes mellitus with diabetic cataract: Secondary | ICD-10-CM | POA: Insufficient documentation

## 2019-08-06 DIAGNOSIS — M109 Gout, unspecified: Secondary | ICD-10-CM | POA: Diagnosis not present

## 2019-08-06 HISTORY — PX: CATARACT EXTRACTION W/PHACO: SHX586

## 2019-08-06 LAB — GLUCOSE, CAPILLARY: Glucose-Capillary: 112 mg/dL — ABNORMAL HIGH (ref 70–99)

## 2019-08-06 SURGERY — PHACOEMULSIFICATION, CATARACT, WITH IOL INSERTION
Anesthesia: Monitor Anesthesia Care | Site: Eye | Laterality: Left

## 2019-08-06 MED ORDER — EPINEPHRINE PF 1 MG/ML IJ SOLN
INTRAMUSCULAR | Status: AC
  Filled 2019-08-06: qty 2

## 2019-08-06 MED ORDER — TETRACAINE HCL 0.5 % OP SOLN
1.0000 [drp] | OPHTHALMIC | Status: AC | PRN
  Administered 2019-08-06 (×3): 1 [drp] via OPHTHALMIC

## 2019-08-06 MED ORDER — POVIDONE-IODINE 5 % OP SOLN
OPHTHALMIC | Status: DC | PRN
  Administered 2019-08-06: 1 via OPHTHALMIC

## 2019-08-06 MED ORDER — LIDOCAINE HCL (PF) 1 % IJ SOLN
INTRAOCULAR | Status: DC | PRN
  Administered 2019-08-06: 13:00:00 1 mL via OPHTHALMIC

## 2019-08-06 MED ORDER — LIDOCAINE HCL 3.5 % OP GEL
1.0000 "application " | Freq: Once | OPHTHALMIC | Status: AC
  Administered 2019-08-06: 1 via OPHTHALMIC
  Filled 2019-08-06: qty 1

## 2019-08-06 MED ORDER — BSS IO SOLN
INTRAOCULAR | Status: DC | PRN
  Administered 2019-08-06: 15 mL via INTRAOCULAR

## 2019-08-06 MED ORDER — MIDAZOLAM HCL 2 MG/2ML IJ SOLN
INTRAMUSCULAR | Status: AC
  Filled 2019-08-06: qty 2

## 2019-08-06 MED ORDER — PHENYLEPHRINE HCL 2.5 % OP SOLN
1.0000 [drp] | OPHTHALMIC | Status: AC | PRN
  Administered 2019-08-06 (×3): 1 [drp] via OPHTHALMIC

## 2019-08-06 MED ORDER — PROVISC 10 MG/ML IO SOLN
INTRAOCULAR | Status: DC | PRN
  Administered 2019-08-06: 0.85 mL via INTRAOCULAR

## 2019-08-06 MED ORDER — SODIUM HYALURONATE 23 MG/ML IO SOLN
INTRAOCULAR | Status: DC | PRN
  Administered 2019-08-06: 0.6 mL via INTRAOCULAR

## 2019-08-06 MED ORDER — CYCLOPENTOLATE-PHENYLEPHRINE 0.2-1 % OP SOLN
1.0000 [drp] | OPHTHALMIC | Status: AC | PRN
  Administered 2019-08-06 (×3): 1 [drp] via OPHTHALMIC

## 2019-08-06 MED ORDER — NEOMYCIN-POLYMYXIN-DEXAMETH 3.5-10000-0.1 OP SUSP
OPHTHALMIC | Status: DC | PRN
  Administered 2019-08-06: 1 [drp] via OPHTHALMIC

## 2019-08-06 MED ORDER — MIDAZOLAM HCL 2 MG/2ML IJ SOLN
INTRAMUSCULAR | Status: DC | PRN
  Administered 2019-08-06: 1 mg via INTRAVENOUS

## 2019-08-06 MED ORDER — EPINEPHRINE PF 1 MG/ML IJ SOLN
INTRAOCULAR | Status: DC | PRN
  Administered 2019-08-06: 13:00:00 500 mL

## 2019-08-06 SURGICAL SUPPLY — 13 items

## 2019-08-06 NOTE — Discharge Instructions (Addendum)
Please discharge patient when stable, will follow up today with Dr. Wrzosek at the [facility] office immediately following discharge.  Leave shield in place until visit.  All paperwork with discharge instructions will be given at the office. ° ° ° ° °Monitored Anesthesia Care, Care After °These instructions provide you with information about caring for yourself after your procedure. Your health care provider may also give you more specific instructions. Your treatment has been planned according to current medical practices, but problems sometimes occur. Call your health care provider if you have any problems or questions after your procedure. °What can I expect after the procedure? °After your procedure, you may: °· Feel sleepy for several hours. °· Feel clumsy and have poor balance for several hours. °· Feel forgetful about what happened after the procedure. °· Have poor judgment for several hours. °· Feel nauseous or vomit. °· Have a sore throat if you had a breathing tube during the procedure. °Follow these instructions at home: °For at least 24 hours after the procedure: ° °  ° °· Have a responsible adult stay with you. It is important to have someone help care for you until you are awake and alert. °· Rest as needed. °· Do not: °? Participate in activities in which you could fall or become injured. °? Drive. °? Use heavy machinery. °? Drink alcohol. °? Take sleeping pills or medicines that cause drowsiness. °? Make important decisions or sign legal documents. °? Take care of children on your own. °Eating and drinking °· Follow the diet that is recommended by your health care provider. °· If you vomit, drink water, juice, or soup when you can drink without vomiting. °· Make sure you have little or no nausea before eating solid foods. °General instructions °· Take over-the-counter and prescription medicines only as told by your health care provider. °· If you have sleep apnea, surgery and certain  medicines can increase your risk for breathing problems. Follow instructions from your health care provider about wearing your sleep device: °? Anytime you are sleeping, including during daytime naps. °? While taking prescription pain medicines, sleeping medicines, or medicines that make you drowsy. °· If you smoke, do not smoke without supervision. °· Keep all follow-up visits as told by your health care provider. This is important. °Contact a health care provider if: °· You keep feeling nauseous or you keep vomiting. °· You feel light-headed. °· You develop a rash. °· You have a fever. °Get help right away if: °· You have trouble breathing. °Summary °· For several hours after your procedure, you may feel sleepy and have poor judgment. °· Have a responsible adult stay with you for at least 24 hours or until you are awake and alert. °This information is not intended to replace advice given to you by your health care provider. Make sure you discuss any questions you have with your health care provider. °Document Released: 01/18/2016 Document Revised: 12/26/2017 Document Reviewed: 01/18/2016 °Elsevier Patient Education © 2020 [employer] ° °

## 2019-08-06 NOTE — Anesthesia Preprocedure Evaluation (Signed)
Anesthesia Evaluation  Patient identified by MRN, date of birth, ID band Patient awake    Reviewed: Allergy & Precautions, NPO status , Patient's Chart, lab work & pertinent test results  History of Anesthesia Complications (+) PONV and history of anesthetic complications  Airway Mallampati: II  TM Distance: >3 FB     Dental  (+) Partial Upper, Partial Lower, Missing   Pulmonary neg pulmonary ROS,    Pulmonary exam normal breath sounds clear to auscultation       Cardiovascular 3 - Mets hypertension, Pt. on medications + DOE  Normal cardiovascular exam+ Valvular Problems/Murmurs  Rhythm:Regular Rate:Normal + Systolic murmurs EKG - SR, Borderline T wave abnormalities    Neuro/Psych negative neurological ROS  negative psych ROS   GI/Hepatic negative GI ROS, Neg liver ROS,   Endo/Other  diabetes, Well Controlled, Type 2  Renal/GU Renal InsufficiencyRenal disease     Musculoskeletal  (+) Arthritis  (back pain),   Abdominal   Peds  Hematology   Anesthesia Other Findings   Reproductive/Obstetrics                             Anesthesia Physical Anesthesia Plan  ASA: III  Anesthesia Plan: MAC   Post-op Pain Management:    Induction:   PONV Risk Score and Plan:   Airway Management Planned: Nasal Cannula and Simple Face Mask  Additional Equipment:   Intra-op Plan:   Post-operative Plan:   Informed Consent: I have reviewed the patients History and Physical, chart, labs and discussed the procedure including the risks, benefits and alternatives for the proposed anesthesia with the patient or authorized representative who has indicated his/her understanding and acceptance.       Plan Discussed with: CRNA  Anesthesia Plan Comments:         Anesthesia Quick Evaluation

## 2019-08-06 NOTE — Interval H&P Note (Signed)
History and Physical Interval Note: The H and P was reviewed and updated. The patient was examined.  No changes were found after exam.  The surgical eye was marked.  08/06/2019 11:09 AM  Carol Keith  has presented today for surgery, with the diagnosis of Nuclear sclerotic cataract - Left eye.  The various methods of treatment have been discussed with the patient and family. After consideration of risks, benefits and other options for treatment, the patient has consented to  Procedure(s) with comments: CATARACT EXTRACTION PHACO AND INTRAOCULAR LENS PLACEMENT ([place]) (Left) - left as a surgical intervention.  The patient's history has been reviewed, patient examined, no change in status, stable for surgery.  I have reviewed the patient's chart and labs.  Questions were answered to the patient's satisfaction.     Baruch Goldmann

## 2019-08-06 NOTE — Op Note (Signed)
Date of procedure: 08/06/19  Pre-operative diagnosis: Visually significant age-related combined cataract, Left Eye (H25.812)  Post-operative diagnosis: Visually significant age-related combined cataract, Left Eye (H25.812)  Procedure: Removal of cataract via phacoemulsification and insertion of intra-ocular lens [employer] PCB00  +20.5D into the capsular bag of the Left Eye  Attending surgeon: Gerda Diss. Elysha Daw, MD, MA  Anesthesia: MAC, Topical Akten  Complications: None  Estimated Blood Loss: <27m (minimal)  Specimens: None  Implants: As above  Indications:  Visually significant age-related cataract, Left Eye  Procedure:  The patient was seen and identified in the pre-operative area. The operative eye was identified and dilated.  The operative eye was marked.  Topical anesthesia was administered to the operative eye.     The patient was then to the operative suite and placed in the supine position.  A timeout was performed confirming the patient, procedure to be performed, and all other relevant information.   The patient's face was prepped and draped in the usual fashion for intra-ocular surgery.  A lid speculum was placed into the operative eye and the surgical microscope moved into place and focused.  An inferotemporal paracentesis was created using a 20 gauge paracentesis blade.  Shugarcaine was injected into the anterior chamber.  Viscoelastic was injected into the anterior chamber.  A temporal clear-corneal main wound incision was created using a 2.421mmicrokeratome.  A continuous curvilinear capsulorrhexis was initiated using an irrigating cystitome and completed using capsulorrhexis forceps.  Hydrodissection and hydrodeliniation were performed.  Viscoelastic was injected into the anterior chamber.  A phacoemulsification handpiece and a chopper as a second instrument were used to remove the nucleus and epinucleus. The irrigation/aspiration handpiece was used to remove  any remaining cortical material.   The capsular bag was reinflated with viscoelastic, checked, and found to be intact.  The intraocular lens was inserted into the capsular bag.  The irrigation/aspiration handpiece was used to remove any remaining viscoelastic.  The clear corneal wound and paracentesis wounds were then hydrated and checked with Weck-Cels to be watertight.  The lid-speculum and drape was removed, and the patient's face was cleaned with a wet and dry 4x4.  Maxitrol was instilled in the eye before a clear shield was taped over the eye. The patient was taken to the post-operative care unit in good condition, having tolerated the procedure well.  Post-Op Instructions: The [facility]operative evaluation and will receive all other orders and instructions.

## 2019-08-06 NOTE — OR Nursing (Signed)
Lidocaine gel given by t Franziska Podgurski at 1222

## 2019-08-06 NOTE — Transfer of Care (Signed)
Immediate Anesthesia Transfer of Care Note  Patient: Carol Keith  Procedure(s) Performed: CATARACT EXTRACTION PHACO AND INTRAOCULAR LENS PLACEMENT LEFT EYE (Left Eye)  Patient Location: Short Stay  Anesthesia Type:MAC  Level of Consciousness: awake, alert , oriented and patient cooperative  Airway & Oxygen Therapy: Patient Spontanous Breathing  Post-op Assessment: Report given to RN and Post -op Vital signs reviewed and stable  Post vital signs: Reviewed and stable  Last Vitals:  Vitals Value Taken Time  BP    Temp    Pulse    Resp    SpO2      Last Pain:  Vitals:   08/06/19 1041  TempSrc: Oral         Complications: No apparent anesthesia complications

## 2019-08-06 NOTE — Anesthesia Postprocedure Evaluation (Signed)
Anesthesia Post Note  Patient: Carol Keith  Procedure(s) Performed: CATARACT EXTRACTION PHACO AND INTRAOCULAR LENS PLACEMENT LEFT EYE (Left Eye)  Patient location during evaluation: Short Stay Anesthesia Type: MAC Level of consciousness: awake and alert Pain management: pain level controlled Vital Signs Assessment: post-procedure vital signs reviewed and stable Respiratory status: spontaneous breathing Cardiovascular status: stable Anesthetic complications: no     Last Vitals:  Vitals:   08/06/19 1041  Pulse: 71  Resp: 18  Temp: 36.7 C  SpO2: 99%    Last Pain:  Vitals:   08/06/19 1041  TempSrc: Oral                 Everette Rank

## 2019-08-07 ENCOUNTER — Encounter (HOSPITAL_COMMUNITY): Payer: Self-pay | Admitting: Ophthalmology

## 2019-08-07 NOTE — Addendum Note (Signed)
Addendum  created 08/07/19 5625 by Mickel Baas, CRNA   Charge Capture section accepted

## 2019-08-13 DIAGNOSIS — H25811 Combined forms of age-related cataract, right eye: Secondary | ICD-10-CM | POA: Diagnosis not present

## 2019-08-14 NOTE — H&P (Signed)
Surgical History & Physical  Patient Name: Carol Keith DOB: 09-22-1951  Surgery: Cataract extraction with intraocular lens implant phacoemulsification; Right Eye  Surgeon: Baruch Goldmann MD Surgery Date:  08/20/2019 Pre-Op Date:  08/13/2019  HPI: A 40 Yr. old female patient 1. 1. The patient complains of difficulty when viewing TV, reading closed caption, news scrolls on TV, which began 2 years ago. The right eye is affected. The episode is gradual. The condition's severity increased since last visit. Symptoms occur when the patient is inside and outside. This is negatively affecting the patient's quality of life. 2. The patient is returning after cataract post-op. The left eye is affected. Status post cataract post-op, which began less than 1 week ago: Since the last visit, the affected area is doing well. The patient's vision is improved. Patient is following medication instructions. HPI was performed by Baruch Goldmann .  Medical History: Cataracts Arthritis Cancer Diabetes Gout Allergies/Sinus Depression Heart Problem High Blood Pressure LDL  Review of Systems Musculoskeletal Joint Ache All recorded systems are negative except as noted above.  Social   Never smoked   Alcohol Never  Medication Prednisolone-gatiflox-bromfenac,  Allopurinol, Amlodipine Besylate, Aspirin, Bumetanide, Bydureon, Cetirizine, Diclofenac, Doxazosin, Fluticasone, Losartan Potassium, Metoprolol, Pantoprazole, Polyethlene glycol, Potassium chloride, Rosuvastatin, Salonpas, Tizandine, Toujeo,   Sx/Procedures Phaco c IOL,  Hysterectomy, Appendectomy, Lumpectomy (Breast),   Drug Allergies  Lipitor, Actos, Ace inhibitors, Metformin,   History & Physical: Heent:  Cataract, Right eye NECK: supple without bruits LUNGS: lungs clear to auscultation CV: regular rate and rhythm Abdomen: soft and non-tender  Impression & Plan: Assessment: 1.  COMBINED FORMS AGE RELATED CATARACT; , Right Eye  (H25.811) 2.  CATARACT EXTRACTION STATUS; Left Eye (Z98.42) 3.  NUCLEAR SCLEROSIS AGE RELATED; Right Eye (H25.11) 4.  PCO; Left Eye [phone])  Plan: 1.  Dilates well - shugarcaine by protocol. Cataract accounts for the patient's decreased vision. This visual impairment is not correctable with a tolerable change in glasses or contact lenses. Cataract surgery with an implantation of a new lens should significantly improve the visual and functional status of the patient. Discussed all risks, benefits, alternatives, and potential complications. Discussed the procedures and recovery. Patient desires to have surgery. A-scan ordered and performed today for intra-ocular lens calculations. The surgery will be performed in order to improve vision for driving, reading, and for eye examinations. Recommend phacoemulsification with intra-ocular lens. Right Eye. Surgery required to correct imbalance of vision. Dilates well - shugarcaine by protocol. 2.  1 week after cataract surgery. Doing well with improved vision and normal eye pressure. Call with any problems or concerns. Continue Gati-Brom-Pred 2x/day for 3 more weeks.

## 2019-08-16 ENCOUNTER — Encounter (HOSPITAL_COMMUNITY)
Admission: RE | Admit: 2019-08-16 | Discharge: 2019-08-16 | Disposition: A | Discharge status: Home / Self Care | Payer: Medicare Other | Source: Ambulatory Visit | Attending: Ophthalmology | Admitting: Ophthalmology

## 2019-08-16 ENCOUNTER — Other Ambulatory Visit: Payer: Self-pay

## 2019-08-16 ENCOUNTER — Encounter (HOSPITAL_COMMUNITY): Payer: Self-pay

## 2019-08-17 ENCOUNTER — Other Ambulatory Visit (HOSPITAL_COMMUNITY)
Admission: RE | Admit: 2019-08-17 | Discharge: 2019-08-17 | Disposition: A | Discharge status: Home / Self Care | Payer: Medicare Other | Source: Ambulatory Visit | Attending: Ophthalmology | Admitting: Ophthalmology

## 2019-08-17 DIAGNOSIS — Z20828 Contact with and (suspected) exposure to other viral communicable diseases: Secondary | ICD-10-CM | POA: Insufficient documentation

## 2019-08-17 DIAGNOSIS — Z01812 Encounter for preprocedural laboratory examination: Secondary | ICD-10-CM | POA: Insufficient documentation

## 2019-08-17 LAB — SARS CORONAVIRUS 2 (TAT 6-24 HRS): SARS Coronavirus 2: NEGATIVE

## 2019-08-20 ENCOUNTER — Encounter (HOSPITAL_COMMUNITY)
Admission: RE | Disposition: A | Discharge status: Home / Self Care | Payer: Self-pay | Source: Home / Self Care | Attending: Ophthalmology

## 2019-08-20 ENCOUNTER — Ambulatory Visit (HOSPITAL_COMMUNITY)
Admission: RE | Admit: 2019-08-20 | Discharge: 2019-08-20 | Disposition: A | Discharge status: Home / Self Care | Payer: Medicare Other | Attending: Ophthalmology | Admitting: Ophthalmology

## 2019-08-20 ENCOUNTER — Ambulatory Visit (HOSPITAL_COMMUNITY): Payer: Medicare Other | Admitting: Anesthesiology

## 2019-08-20 ENCOUNTER — Encounter (HOSPITAL_COMMUNITY): Payer: Self-pay | Admitting: *Deleted

## 2019-08-20 DIAGNOSIS — E1136 Type 2 diabetes mellitus with diabetic cataract: Secondary | ICD-10-CM | POA: Diagnosis not present

## 2019-08-20 DIAGNOSIS — Z90710 Acquired absence of both cervix and uterus: Secondary | ICD-10-CM | POA: Diagnosis not present

## 2019-08-20 DIAGNOSIS — Z859 Personal history of malignant neoplasm, unspecified: Secondary | ICD-10-CM | POA: Insufficient documentation

## 2019-08-20 DIAGNOSIS — M1990 Unspecified osteoarthritis, unspecified site: Secondary | ICD-10-CM | POA: Insufficient documentation

## 2019-08-20 DIAGNOSIS — M109 Gout, unspecified: Secondary | ICD-10-CM | POA: Diagnosis not present

## 2019-08-20 DIAGNOSIS — Z888 Allergy status to other drugs, medicaments and biological substances status: Secondary | ICD-10-CM | POA: Insufficient documentation

## 2019-08-20 DIAGNOSIS — H25811 Combined forms of age-related cataract, right eye: Secondary | ICD-10-CM | POA: Diagnosis not present

## 2019-08-20 DIAGNOSIS — I10 Essential (primary) hypertension: Secondary | ICD-10-CM | POA: Diagnosis not present

## 2019-08-20 DIAGNOSIS — E119 Type 2 diabetes mellitus without complications: Secondary | ICD-10-CM | POA: Diagnosis not present

## 2019-08-20 HISTORY — PX: CATARACT EXTRACTION W/PHACO: SHX586

## 2019-08-20 LAB — GLUCOSE, CAPILLARY: Glucose-Capillary: 118 mg/dL — ABNORMAL HIGH (ref 70–99)

## 2019-08-20 SURGERY — PHACOEMULSIFICATION, CATARACT, WITH IOL INSERTION
Anesthesia: Monitor Anesthesia Care | Site: Eye | Laterality: Right

## 2019-08-20 MED ORDER — LIDOCAINE HCL 3.5 % OP GEL
1.0000 "application " | Freq: Once | OPHTHALMIC | Status: AC
  Administered 2019-08-20: 1 via OPHTHALMIC

## 2019-08-20 MED ORDER — POVIDONE-IODINE 5 % OP SOLN
OPHTHALMIC | Status: DC | PRN
  Administered 2019-08-20: 1 via OPHTHALMIC

## 2019-08-20 MED ORDER — BSS IO SOLN
INTRAOCULAR | Status: DC | PRN
  Administered 2019-08-20: 15 mL

## 2019-08-20 MED ORDER — PROVISC 10 MG/ML IO SOLN
INTRAOCULAR | Status: DC | PRN
  Administered 2019-08-20: 0.85 mL via INTRAOCULAR

## 2019-08-20 MED ORDER — PHENYLEPHRINE HCL 2.5 % OP SOLN
1.0000 [drp] | OPHTHALMIC | Status: AC | PRN
  Administered 2019-08-20 (×3): 1 [drp] via OPHTHALMIC

## 2019-08-20 MED ORDER — EPINEPHRINE PF 1 MG/ML IJ SOLN
INTRAOCULAR | Status: DC | PRN
  Administered 2019-08-20: 500 mL

## 2019-08-20 MED ORDER — SODIUM HYALURONATE 23 MG/ML IO SOLN
INTRAOCULAR | Status: DC | PRN
  Administered 2019-08-20: 0.6 mL via INTRAOCULAR

## 2019-08-20 MED ORDER — NEOMYCIN-POLYMYXIN-DEXAMETH 3.5-10000-0.1 OP SUSP
OPHTHALMIC | Status: DC | PRN
  Administered 2019-08-20: 2 [drp] via OPHTHALMIC

## 2019-08-20 MED ORDER — LIDOCAINE HCL (PF) 1 % IJ SOLN
INTRAOCULAR | Status: DC | PRN
  Administered 2019-08-20: 1 mL via OPHTHALMIC

## 2019-08-20 MED ORDER — EPINEPHRINE PF 1 MG/ML IJ SOLN
INTRAMUSCULAR | Status: AC
  Filled 2019-08-20: qty 1

## 2019-08-20 MED ORDER — TETRACAINE HCL 0.5 % OP SOLN
1.0000 [drp] | OPHTHALMIC | Status: AC | PRN
  Administered 2019-08-20 (×3): 1 [drp] via OPHTHALMIC

## 2019-08-20 MED ORDER — MIDAZOLAM HCL 5 MG/5ML IJ SOLN
INTRAMUSCULAR | Status: DC | PRN
  Administered 2019-08-20: 1 mg via INTRAVENOUS

## 2019-08-20 MED ORDER — CYCLOPENTOLATE-PHENYLEPHRINE 0.2-1 % OP SOLN
1.0000 [drp] | OPHTHALMIC | Status: AC | PRN
  Administered 2019-08-20 (×3): 1 [drp] via OPHTHALMIC

## 2019-08-20 MED ORDER — MIDAZOLAM HCL 2 MG/2ML IJ SOLN
INTRAMUSCULAR | Status: AC
  Filled 2019-08-20: qty 2

## 2019-08-20 SURGICAL SUPPLY — 13 items

## 2019-08-20 NOTE — Transfer of Care (Signed)
Immediate Anesthesia Transfer of Care Note  Patient: Carol Keith  Procedure(s) Performed: CATARACT EXTRACTION PHACO AND INTRAOCULAR LENS PLACEMENT RIGHT EYE CDE=5.88 (Right Eye)  Patient Location: PACU  Anesthesia Type:MAC  Level of Consciousness: awake, alert  and oriented  Airway & Oxygen Therapy: Patient Spontanous Breathing  Post-op Assessment: Report given to RN and Post -op Vital signs reviewed and stable  Post vital signs: Reviewed and stable  Last Vitals:  Vitals Value Taken Time  BP    Temp    Pulse    Resp    SpO2      Last Pain:  Vitals:   08/20/19 0801  TempSrc: Oral  PainSc: 0-No pain         Complications: No apparent anesthesia complications

## 2019-08-20 NOTE — Anesthesia Postprocedure Evaluation (Signed)
Anesthesia Post Note  Patient: Carol Keith  Procedure(s) Performed: CATARACT EXTRACTION PHACO AND INTRAOCULAR LENS PLACEMENT RIGHT EYE CDE=5.88 (Right Eye)  Patient location during evaluation: Phase II Anesthesia Type: MAC Level of consciousness: awake and alert Pain management: pain level controlled Vital Signs Assessment: post-procedure vital signs reviewed and stable Respiratory status: spontaneous breathing, nonlabored ventilation and respiratory function stable Cardiovascular status: stable Postop Assessment: able to ambulate and adequate PO intake Anesthetic complications: no     Last Vitals:  Vitals:   08/20/19 0801  BP: (!) 170/78  Pulse: 69  Resp: 18  Temp: 36.7 C  SpO2: 99%    Last Pain:  Vitals:   08/20/19 0801  TempSrc: Oral  PainSc: 0-No pain                 [name]

## 2019-08-20 NOTE — Discharge Instructions (Signed)
Please discharge patient when stable, will follow up today with Dr. Tamiya Colello at the [facility] office immediately following discharge.  Leave shield in place until visit.  All paperwork with discharge instructions will be given at the office. ° °

## 2019-08-20 NOTE — Op Note (Signed)
Date of procedure: 08/20/19  Pre-operative diagnosis:  Visually significant combined form age-related cataract, Right Eye (H25.811)  Post-operative diagnosis:  Visually significant combined form age-related cataract, Right Eye (H25.811)  Procedure: Removal of cataract via phacoemulsification and insertion of intra-ocular lens [employer] Vision PCB00  +21.0D into the capsular bag of the Right Eye  Attending surgeon: Gerda Diss. Kyelle Urbas, MD, MA  Anesthesia: MAC, Topical Akten  Complications: None  Estimated Blood Loss: <35m (minimal)  Specimens: None  Implants: As above  Indications:  Visually significant age-related cataract, Right Eye  Procedure:  The patient was seen and identified in the pre-operative area. The operative eye was identified and dilated.  The operative eye was marked.  Topical anesthesia was administered to the operative eye.     The patient was then to the operative suite and placed in the supine position.  A timeout was performed confirming the patient, procedure to be performed, and all other relevant information.   The patient's face was prepped and draped in the usual fashion for intra-ocular surgery.  A lid speculum was placed into the operative eye and the surgical microscope moved into place and focused.  A superotemporal paracentesis was created using a 20 gauge paracentesis blade.  Shugarcaine was injected into the anterior chamber.  Viscoelastic was injected into the anterior chamber.  A temporal clear-corneal main wound incision was created using a 2.473mmicrokeratome.  A continuous curvilinear capsulorrhexis was initiated using an irrigating cystitome and completed using capsulorrhexis forceps.  Hydrodissection and hydrodeliniation were performed.  Viscoelastic was injected into the anterior chamber.  A phacoemulsification handpiece and a chopper as a second instrument were used to remove the nucleus and epinucleus. The irrigation/aspiration handpiece was  used to remove any remaining cortical material.   The capsular bag was reinflated with viscoelastic, checked, and found to be intact.  The intraocular lens was inserted into the capsular bag.  The irrigation/aspiration handpiece was used to remove any remaining viscoelastic.  The clear corneal wound and paracentesis wounds were then hydrated and checked with Weck-Cels to be watertight.  The lid-speculum and drape was removed, and the patient's face was cleaned with a wet and dry 4x4.  Maxitrol was instilled in the eye before a clear shield was taped over the eye. The patient was taken to the post-operative care unit in good condition, having tolerated the procedure well.  Post-Op Instructions: [facility] and will receive all other orders and instructions.

## 2019-08-20 NOTE — Anesthesia Preprocedure Evaluation (Signed)
Anesthesia Evaluation  Patient identified by MRN, date of birth, ID band Patient awake    Reviewed: Allergy & Precautions, NPO status , Patient's Chart, lab work & pertinent test results, reviewed documented beta blocker date and time   History of Anesthesia Complications (+) PONV and history of anesthetic complications  Airway Mallampati: II  TM Distance: >3 FB Neck ROM: Full    Dental no notable dental hx. (+) Partial Upper, Partial Lower   Pulmonary neg pulmonary ROS,    Pulmonary exam normal breath sounds clear to auscultation       Cardiovascular Exercise Tolerance: Good hypertension, Pt. on medications and Pt. on home beta blockers negative cardio ROS Normal cardiovascular exam Rhythm:Regular Rate:Normal     Neuro/Psych negative neurological ROS  negative psych ROS   GI/Hepatic negative GI ROS, Neg liver ROS,   Endo/Other  negative endocrine ROSdiabetes  Renal/GU Renal InsufficiencyRenal disease  negative genitourinary   Musculoskeletal  (+) Arthritis , Osteoarthritis,    Abdominal   Peds negative pediatric ROS (+)  Hematology negative hematology ROS (+)   Anesthesia Other Findings   Reproductive/Obstetrics negative OB ROS                             Anesthesia Physical Anesthesia Plan  ASA: II  Anesthesia Plan: MAC   Post-op Pain Management:    Induction: Intravenous  PONV Risk Score and Plan: 3 and Midazolam, TIVA, Propofol infusion, Ondansetron and Treatment may vary due to age or medical condition  Airway Management Planned:   Additional Equipment:   Intra-op Plan:   Post-operative Plan:   Informed Consent: I have reviewed the patients History and Physical, chart, labs and discussed the procedure including the risks, benefits and alternatives for the proposed anesthesia with the patient or authorized representative who has indicated his/her understanding and  acceptance.     Dental advisory given  Plan Discussed with: CRNA  Anesthesia Plan Comments: (Plan Full PPE use  Plan MAC- WTP with same after Q&A  Second IOL -last 08/06/19 -states will want a little something to relax)        Anesthesia Quick Evaluation

## 2019-08-21 ENCOUNTER — Encounter (HOSPITAL_COMMUNITY): Payer: Self-pay | Admitting: Ophthalmology

## 2019-08-21 DIAGNOSIS — H25011 Cortical age-related cataract, right eye: Secondary | ICD-10-CM | POA: Diagnosis not present

## 2019-08-21 NOTE — Addendum Note (Signed)
Addendum  created 08/21/19 1038 by Mickel Baas, CRNA   Charge Capture section accepted

## 2019-09-18 DIAGNOSIS — E7800 Pure hypercholesterolemia, unspecified: Secondary | ICD-10-CM | POA: Diagnosis not present

## 2019-09-18 DIAGNOSIS — I10 Essential (primary) hypertension: Secondary | ICD-10-CM | POA: Diagnosis not present

## 2019-09-18 DIAGNOSIS — I2510 Atherosclerotic heart disease of native coronary artery without angina pectoris: Secondary | ICD-10-CM | POA: Diagnosis not present

## 2019-09-18 DIAGNOSIS — E119 Type 2 diabetes mellitus without complications: Secondary | ICD-10-CM | POA: Diagnosis not present

## 2019-10-23 DIAGNOSIS — I10 Essential (primary) hypertension: Secondary | ICD-10-CM | POA: Diagnosis not present

## 2019-10-23 DIAGNOSIS — E119 Type 2 diabetes mellitus without complications: Secondary | ICD-10-CM | POA: Diagnosis not present

## 2019-10-23 DIAGNOSIS — E7800 Pure hypercholesterolemia, unspecified: Secondary | ICD-10-CM | POA: Diagnosis not present

## 2019-10-23 DIAGNOSIS — I2510 Atherosclerotic heart disease of native coronary artery without angina pectoris: Secondary | ICD-10-CM | POA: Diagnosis not present

## 2019-10-29 DIAGNOSIS — Z789 Other specified health status: Secondary | ICD-10-CM | POA: Diagnosis not present

## 2019-10-29 DIAGNOSIS — I10 Essential (primary) hypertension: Secondary | ICD-10-CM | POA: Diagnosis not present

## 2019-10-29 DIAGNOSIS — Z6834 Body mass index (BMI) 34.0-34.9, adult: Secondary | ICD-10-CM | POA: Diagnosis not present

## 2019-10-29 DIAGNOSIS — E1165 Type 2 diabetes mellitus with hyperglycemia: Secondary | ICD-10-CM | POA: Diagnosis not present

## 2019-10-29 DIAGNOSIS — E1122 Type 2 diabetes mellitus with diabetic chronic kidney disease: Secondary | ICD-10-CM | POA: Diagnosis not present

## 2019-10-29 DIAGNOSIS — E559 Vitamin D deficiency, unspecified: Secondary | ICD-10-CM | POA: Diagnosis not present

## 2019-10-29 DIAGNOSIS — N183 Chronic kidney disease, stage 3 unspecified: Secondary | ICD-10-CM | POA: Diagnosis not present

## 2019-10-29 DIAGNOSIS — Z299 Encounter for prophylactic measures, unspecified: Secondary | ICD-10-CM | POA: Diagnosis not present

## 2019-11-06 DIAGNOSIS — Z20828 Contact with and (suspected) exposure to other viral communicable diseases: Secondary | ICD-10-CM | POA: Diagnosis not present

## 2019-11-06 DIAGNOSIS — B349 Viral infection, unspecified: Secondary | ICD-10-CM | POA: Diagnosis not present

## 2019-11-08 DIAGNOSIS — Z299 Encounter for prophylactic measures, unspecified: Secondary | ICD-10-CM | POA: Diagnosis not present

## 2019-11-08 DIAGNOSIS — R251 Tremor, unspecified: Secondary | ICD-10-CM | POA: Diagnosis not present

## 2019-11-08 DIAGNOSIS — R1084 Generalized abdominal pain: Secondary | ICD-10-CM | POA: Diagnosis not present

## 2019-11-08 DIAGNOSIS — N183 Chronic kidney disease, stage 3 unspecified: Secondary | ICD-10-CM | POA: Diagnosis not present

## 2019-11-08 DIAGNOSIS — Z789 Other specified health status: Secondary | ICD-10-CM | POA: Diagnosis not present

## 2019-11-08 DIAGNOSIS — Z6834 Body mass index (BMI) 34.0-34.9, adult: Secondary | ICD-10-CM | POA: Diagnosis not present

## 2019-11-08 DIAGNOSIS — E1122 Type 2 diabetes mellitus with diabetic chronic kidney disease: Secondary | ICD-10-CM | POA: Diagnosis not present

## 2019-11-21 DIAGNOSIS — I10 Essential (primary) hypertension: Secondary | ICD-10-CM | POA: Diagnosis not present

## 2019-11-21 DIAGNOSIS — E7800 Pure hypercholesterolemia, unspecified: Secondary | ICD-10-CM | POA: Diagnosis not present

## 2019-11-21 DIAGNOSIS — E119 Type 2 diabetes mellitus without complications: Secondary | ICD-10-CM | POA: Diagnosis not present

## 2019-11-21 DIAGNOSIS — I2510 Atherosclerotic heart disease of native coronary artery without angina pectoris: Secondary | ICD-10-CM | POA: Diagnosis not present

## 2019-11-27 DIAGNOSIS — Z1231 Encounter for screening mammogram for malignant neoplasm of breast: Secondary | ICD-10-CM | POA: Diagnosis not present

## 2019-12-17 DIAGNOSIS — E119 Type 2 diabetes mellitus without complications: Secondary | ICD-10-CM | POA: Diagnosis not present

## 2019-12-17 DIAGNOSIS — I2510 Atherosclerotic heart disease of native coronary artery without angina pectoris: Secondary | ICD-10-CM | POA: Diagnosis not present

## 2019-12-17 DIAGNOSIS — I10 Essential (primary) hypertension: Secondary | ICD-10-CM | POA: Diagnosis not present

## 2019-12-17 DIAGNOSIS — E7800 Pure hypercholesterolemia, unspecified: Secondary | ICD-10-CM | POA: Diagnosis not present

## 2020-01-15 DIAGNOSIS — I2510 Atherosclerotic heart disease of native coronary artery without angina pectoris: Secondary | ICD-10-CM | POA: Diagnosis not present

## 2020-01-15 DIAGNOSIS — I10 Essential (primary) hypertension: Secondary | ICD-10-CM | POA: Diagnosis not present

## 2020-01-15 DIAGNOSIS — E7800 Pure hypercholesterolemia, unspecified: Secondary | ICD-10-CM | POA: Diagnosis not present

## 2020-01-15 DIAGNOSIS — E119 Type 2 diabetes mellitus without complications: Secondary | ICD-10-CM | POA: Diagnosis not present

## 2020-01-30 DIAGNOSIS — E1129 Type 2 diabetes mellitus with other diabetic kidney complication: Secondary | ICD-10-CM | POA: Diagnosis not present

## 2020-01-30 DIAGNOSIS — N183 Chronic kidney disease, stage 3 unspecified: Secondary | ICD-10-CM | POA: Diagnosis not present

## 2020-01-30 DIAGNOSIS — R809 Proteinuria, unspecified: Secondary | ICD-10-CM | POA: Diagnosis not present

## 2020-01-30 DIAGNOSIS — I10 Essential (primary) hypertension: Secondary | ICD-10-CM | POA: Diagnosis not present

## 2020-01-30 DIAGNOSIS — N764 Abscess of vulva: Secondary | ICD-10-CM | POA: Diagnosis not present

## 2020-01-30 DIAGNOSIS — Z299 Encounter for prophylactic measures, unspecified: Secondary | ICD-10-CM | POA: Diagnosis not present

## 2020-02-13 DIAGNOSIS — Z299 Encounter for prophylactic measures, unspecified: Secondary | ICD-10-CM | POA: Diagnosis not present

## 2020-02-13 DIAGNOSIS — Z853 Personal history of malignant neoplasm of breast: Secondary | ICD-10-CM | POA: Diagnosis not present

## 2020-02-13 DIAGNOSIS — J309 Allergic rhinitis, unspecified: Secondary | ICD-10-CM | POA: Diagnosis not present

## 2020-02-13 DIAGNOSIS — I10 Essential (primary) hypertension: Secondary | ICD-10-CM | POA: Diagnosis not present

## 2020-02-13 DIAGNOSIS — I890 Lymphedema, not elsewhere classified: Secondary | ICD-10-CM | POA: Diagnosis not present

## 2020-02-13 DIAGNOSIS — M25512 Pain in left shoulder: Secondary | ICD-10-CM | POA: Diagnosis not present

## 2020-02-21 DIAGNOSIS — R05 Cough: Secondary | ICD-10-CM | POA: Diagnosis not present

## 2020-03-09 DIAGNOSIS — E119 Type 2 diabetes mellitus without complications: Secondary | ICD-10-CM | POA: Diagnosis not present

## 2020-03-09 DIAGNOSIS — E7800 Pure hypercholesterolemia, unspecified: Secondary | ICD-10-CM | POA: Diagnosis not present

## 2020-03-09 DIAGNOSIS — I10 Essential (primary) hypertension: Secondary | ICD-10-CM | POA: Diagnosis not present

## 2020-03-09 DIAGNOSIS — I2510 Atherosclerotic heart disease of native coronary artery without angina pectoris: Secondary | ICD-10-CM | POA: Diagnosis not present

## 2020-03-19 DIAGNOSIS — Z299 Encounter for prophylactic measures, unspecified: Secondary | ICD-10-CM | POA: Diagnosis not present

## 2020-03-19 DIAGNOSIS — N183 Chronic kidney disease, stage 3 unspecified: Secondary | ICD-10-CM | POA: Diagnosis not present

## 2020-03-19 DIAGNOSIS — E1122 Type 2 diabetes mellitus with diabetic chronic kidney disease: Secondary | ICD-10-CM | POA: Diagnosis not present

## 2020-03-19 DIAGNOSIS — C50919 Malignant neoplasm of unspecified site of unspecified female breast: Secondary | ICD-10-CM | POA: Diagnosis not present

## 2020-03-19 DIAGNOSIS — E1165 Type 2 diabetes mellitus with hyperglycemia: Secondary | ICD-10-CM | POA: Diagnosis not present

## 2020-04-09 DIAGNOSIS — I10 Essential (primary) hypertension: Secondary | ICD-10-CM | POA: Diagnosis not present

## 2020-04-09 DIAGNOSIS — E119 Type 2 diabetes mellitus without complications: Secondary | ICD-10-CM | POA: Diagnosis not present

## 2020-04-09 DIAGNOSIS — E7800 Pure hypercholesterolemia, unspecified: Secondary | ICD-10-CM | POA: Diagnosis not present

## 2020-04-09 DIAGNOSIS — I2510 Atherosclerotic heart disease of native coronary artery without angina pectoris: Secondary | ICD-10-CM | POA: Diagnosis not present

## 2020-05-07 DIAGNOSIS — Z299 Encounter for prophylactic measures, unspecified: Secondary | ICD-10-CM | POA: Diagnosis not present

## 2020-05-07 DIAGNOSIS — Z6834 Body mass index (BMI) 34.0-34.9, adult: Secondary | ICD-10-CM | POA: Diagnosis not present

## 2020-05-07 DIAGNOSIS — E1129 Type 2 diabetes mellitus with other diabetic kidney complication: Secondary | ICD-10-CM | POA: Diagnosis not present

## 2020-05-07 DIAGNOSIS — I10 Essential (primary) hypertension: Secondary | ICD-10-CM | POA: Diagnosis not present

## 2020-05-07 DIAGNOSIS — R809 Proteinuria, unspecified: Secondary | ICD-10-CM | POA: Diagnosis not present

## 2020-05-07 DIAGNOSIS — K649 Unspecified hemorrhoids: Secondary | ICD-10-CM | POA: Diagnosis not present

## 2020-05-07 DIAGNOSIS — I739 Peripheral vascular disease, unspecified: Secondary | ICD-10-CM | POA: Diagnosis not present

## 2020-05-09 DIAGNOSIS — E119 Type 2 diabetes mellitus without complications: Secondary | ICD-10-CM | POA: Diagnosis not present

## 2020-05-09 DIAGNOSIS — I2510 Atherosclerotic heart disease of native coronary artery without angina pectoris: Secondary | ICD-10-CM | POA: Diagnosis not present

## 2020-05-09 DIAGNOSIS — I10 Essential (primary) hypertension: Secondary | ICD-10-CM | POA: Diagnosis not present

## 2020-05-09 DIAGNOSIS — E7800 Pure hypercholesterolemia, unspecified: Secondary | ICD-10-CM | POA: Diagnosis not present

## 2020-05-13 DIAGNOSIS — E119 Type 2 diabetes mellitus without complications: Secondary | ICD-10-CM | POA: Diagnosis not present

## 2020-05-13 DIAGNOSIS — I10 Essential (primary) hypertension: Secondary | ICD-10-CM | POA: Diagnosis not present

## 2020-05-13 DIAGNOSIS — I2510 Atherosclerotic heart disease of native coronary artery without angina pectoris: Secondary | ICD-10-CM | POA: Diagnosis not present

## 2020-05-13 DIAGNOSIS — E7800 Pure hypercholesterolemia, unspecified: Secondary | ICD-10-CM | POA: Diagnosis not present

## 2020-06-04 DIAGNOSIS — Z86010 Personal history of colonic polyps: Secondary | ICD-10-CM | POA: Diagnosis not present

## 2020-07-01 DIAGNOSIS — Z20822 Contact with and (suspected) exposure to covid-19: Secondary | ICD-10-CM | POA: Diagnosis not present

## 2020-07-01 DIAGNOSIS — Z01812 Encounter for preprocedural laboratory examination: Secondary | ICD-10-CM | POA: Diagnosis not present

## 2020-07-03 DIAGNOSIS — Z7982 Long term (current) use of aspirin: Secondary | ICD-10-CM | POA: Diagnosis not present

## 2020-07-03 DIAGNOSIS — M109 Gout, unspecified: Secondary | ICD-10-CM | POA: Diagnosis not present

## 2020-07-03 DIAGNOSIS — E119 Type 2 diabetes mellitus without complications: Secondary | ICD-10-CM | POA: Diagnosis not present

## 2020-07-03 DIAGNOSIS — Z7984 Long term (current) use of oral hypoglycemic drugs: Secondary | ICD-10-CM | POA: Diagnosis not present

## 2020-07-03 DIAGNOSIS — D122 Benign neoplasm of ascending colon: Secondary | ICD-10-CM | POA: Diagnosis not present

## 2020-07-03 DIAGNOSIS — I10 Essential (primary) hypertension: Secondary | ICD-10-CM | POA: Diagnosis not present

## 2020-07-03 DIAGNOSIS — Z1211 Encounter for screening for malignant neoplasm of colon: Secondary | ICD-10-CM | POA: Diagnosis not present

## 2020-07-03 DIAGNOSIS — Z86010 Personal history of colonic polyps: Secondary | ICD-10-CM | POA: Diagnosis not present

## 2020-07-03 DIAGNOSIS — D126 Benign neoplasm of colon, unspecified: Secondary | ICD-10-CM | POA: Diagnosis not present

## 2020-07-03 DIAGNOSIS — Z853 Personal history of malignant neoplasm of breast: Secondary | ICD-10-CM | POA: Diagnosis not present

## 2020-07-03 DIAGNOSIS — Z79899 Other long term (current) drug therapy: Secondary | ICD-10-CM | POA: Diagnosis not present

## 2020-07-03 DIAGNOSIS — K635 Polyp of colon: Secondary | ICD-10-CM | POA: Diagnosis not present

## 2020-07-07 IMAGING — MR MR ABDOMEN WO/W CM MRCP
10 of 17 series · 28 of 48 positions shown · IV contrast (multihance)
Comparison: CT on 01/25/2019

CLINICAL DATA: Elevated liver function tests. Previous
cholecystectomy, and sphincterotomy for choledocholithiasis.

Creatinine was obtained on site at [HOSPITAL] at [HOSPITAL].
Results: Creatinine 1.0 mg/dL.
EXAM:
MRI ABDOMEN WITHOUT AND WITH CONTRAST (INCLUDING MRCP)
TECHNIQUE: Multiplanar multisequence MR imaging of the abdomen was performed
both before and after the administration of intravenous contrast.
Heavily T2-weighted images of the biliary and pancreatic ducts were
obtained, and three-dimensional MRCP images were rendered by post
processing.
CONTRAST:  18mL MULTIHANCE GADOBENATE DIMEGLUMINE 529 MG/ML IV SOLN

[Series 3: cor haste · coronal · 5.0mm · 0.70mm/px · 2 of 32 slices shown]
[im 1/32]
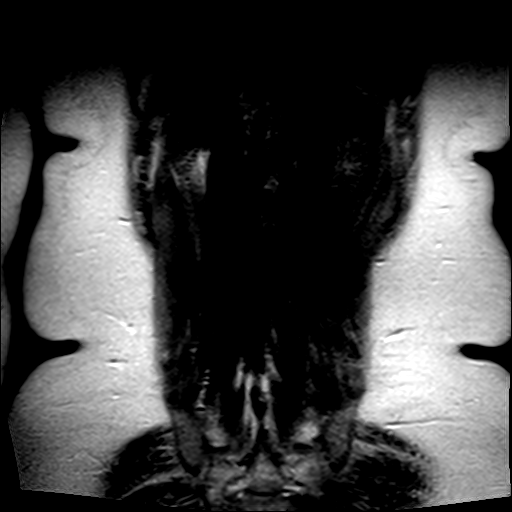
[im 32/32]
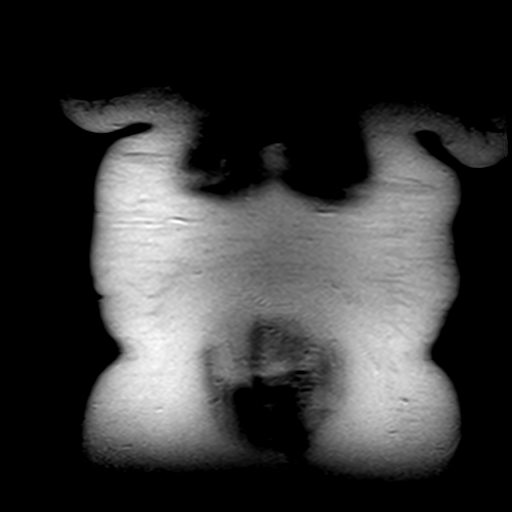

[Series 4: axial haste · axial · 6.0mm · 0.70mm/px · z∈[-53,+185]mm · 2 of 37 slices shown]
[im 1/37]
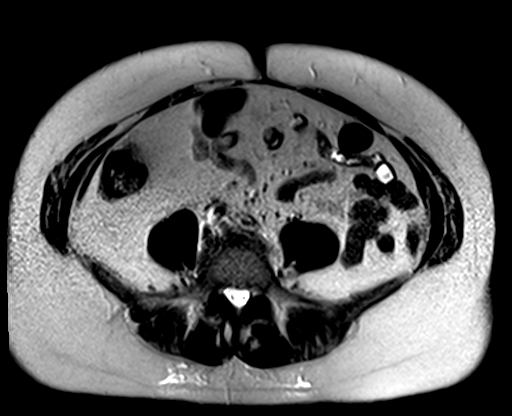
[im 37/37]
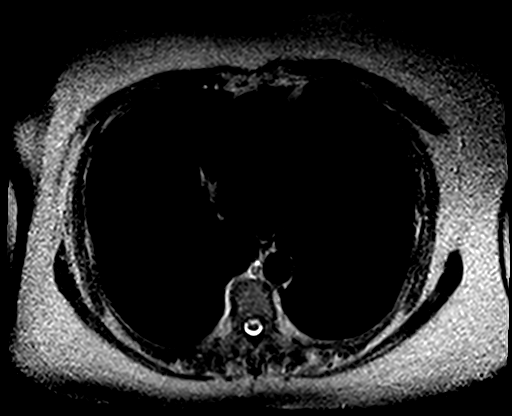

[Series 5: T1 · axial · 6.0mm · 0.70mm/px · z∈[-53,+185]mm · 4 of 74 slices shown]
[im 1/74]
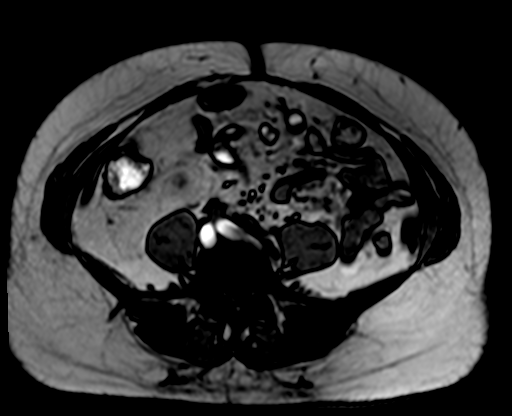
[im 25/74]
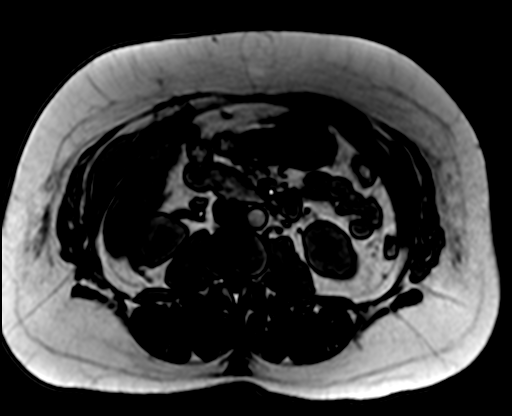
[im 49/74]
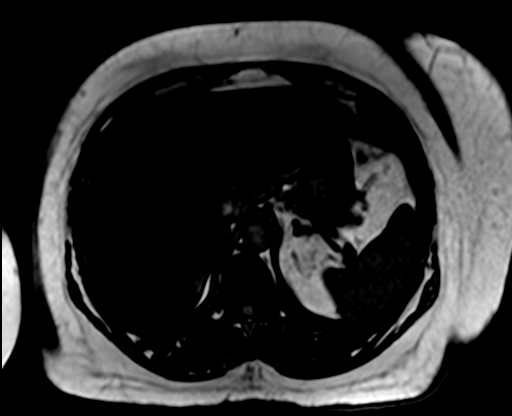
[im 74/74]
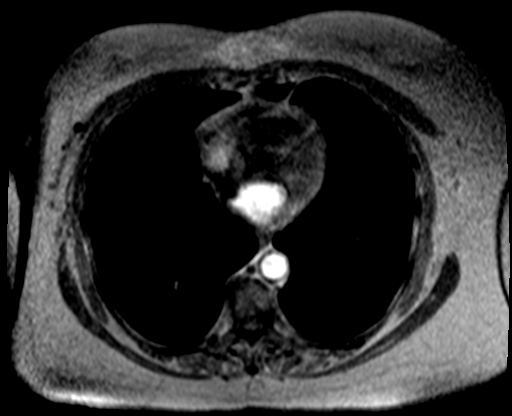

[Series 6: ep2d_diff_b50_500_800_p2_trig · axial · 6.0mm · 1.88mm/px · z∈[-6,+232]mm · 5 of 111 slices shown]
[im 1/111]
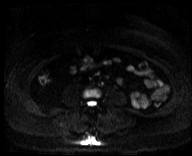
[im 28/111]
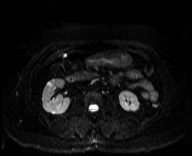
[im 56/111]
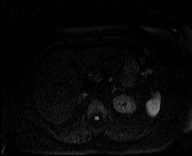
[im 83/111]
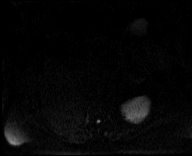
[im 111/111]
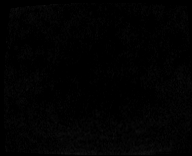

[Series 7: ep2d_diff_b50_500_800_p2_trig_adc · axial · 6.0mm · 1.88mm/px · z∈[-6,+232]mm · 2 of 37 slices shown]
[im 1/37]
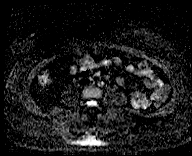
[im 37/37]
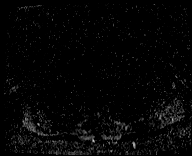

[Series 8: T2 · axial · 6.0mm · 1.12mm/px · 1 of 38 slices shown]
[im 1/38]
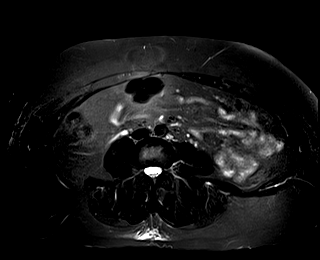

[Series 13: T1 dynamic · axial · non-contrast · 2.5mm · 0.70mm/px · z∈[-53,+185]mm · 3 of 96 slices shown]
[im 1/96]
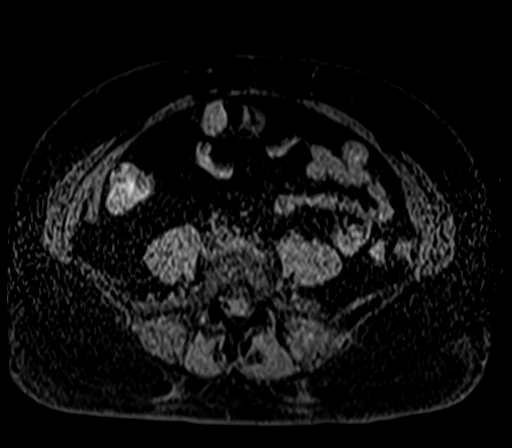
[im 48/96]
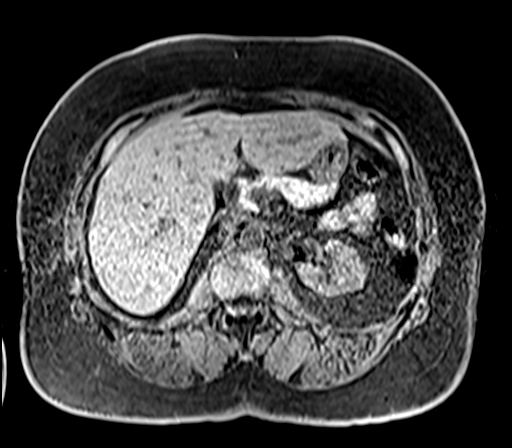
[im 96/96]
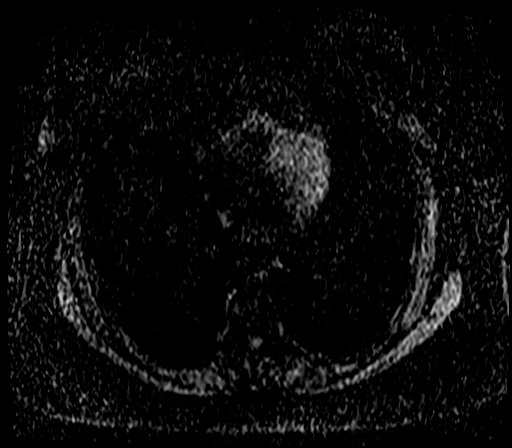

[Series 14: T1 dynamic post-contrast · axial · 2.5mm · 0.70mm/px · z∈[-53,+185]mm · 3 of 96 slices shown (1 of 3)]
[im 1/96]
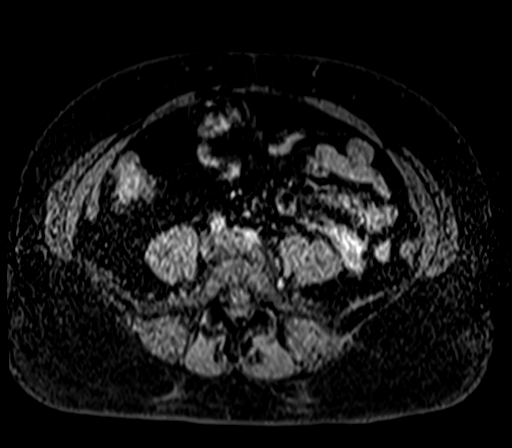
[im 48/96]
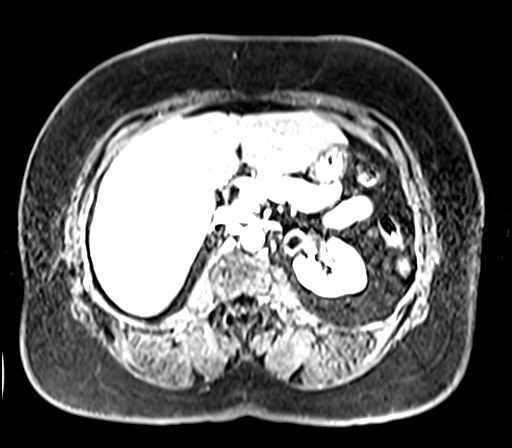
[im 96/96]
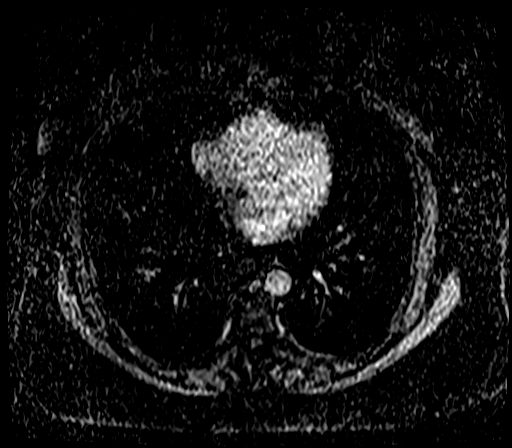

[Series 15: T1 dynamic post-contrast · axial · 2.5mm · 0.70mm/px · z∈[-53,+185]mm · 3 of 96 slices shown (2 of 3)]
[im 1/96]
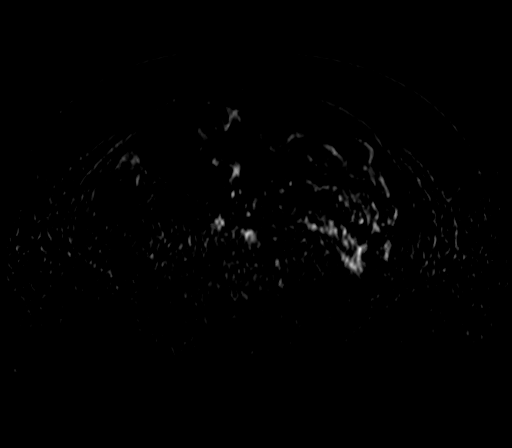
[im 48/96]
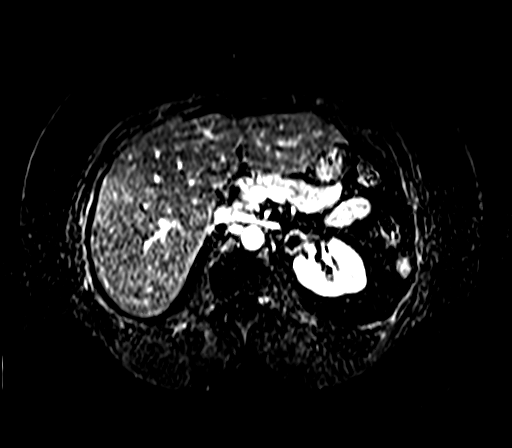
[im 96/96]
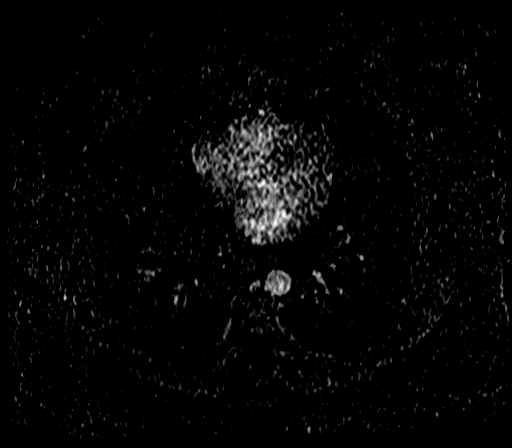

[Series 16: T1 dynamic post-contrast · axial · 2.5mm · 0.70mm/px · z∈[-53,+185]mm · 3 of 96 slices shown (3 of 3)]
[im 1/96]
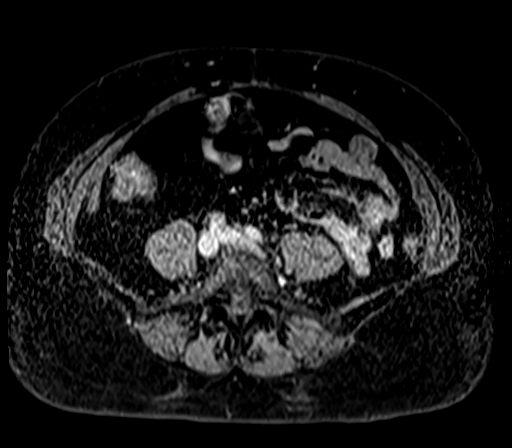
[im 48/96]
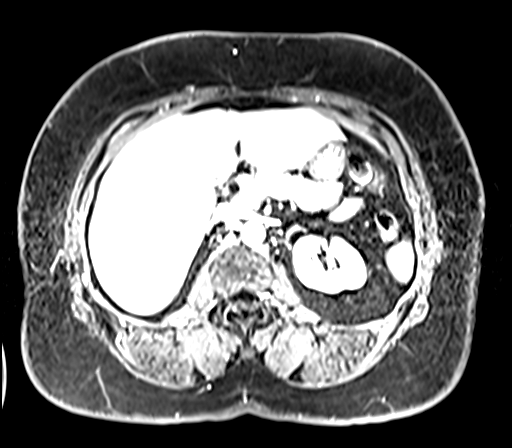
[im 96/96]
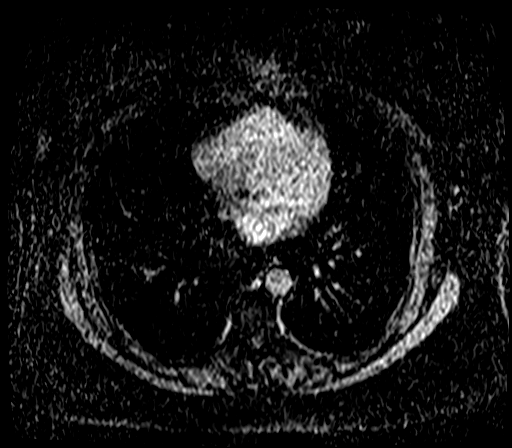

[28 of 48 positions shown; findings below may reference images not displayed]

FINDINGS: Lower chest: No acute findings.

Hepatobiliary: No hepatic masses identified. Mild-to-moderate
diffuse hepatic steatosis on chemical shift imaging. Prior
cholecystectomy. Common bile duct measures 9 mm in diameter which is
within normal limits status post cholecystectomy. No evidence of
choledocholithiasis or biliary stricture.

Pancreas: No mass or inflammatory changes. No evidence of pancreatic
ductal dilatation or pancreas divisum.

Spleen:  Within normal limits in size and appearance.

Adrenals/Urinary Tract: No masses identified. Small bilateral renal
cysts noted. No evidence of hydronephrosis.

Stomach/Bowel: Visualized portion unremarkable.

Vascular/Lymphatic: No pathologically enlarged lymph nodes
identified. No abdominal aortic aneurysm.

Other:  None.

Musculoskeletal:  No suspicious bone lesions identified.
IMPRESSION: Prior cholecystectomy. No evidence of choledocholithiasis, biliary
obstruction, or other acute findings.

Diffuse hepatic steatosis.

## 2020-07-10 DIAGNOSIS — I2510 Atherosclerotic heart disease of native coronary artery without angina pectoris: Secondary | ICD-10-CM | POA: Diagnosis not present

## 2020-07-10 DIAGNOSIS — I10 Essential (primary) hypertension: Secondary | ICD-10-CM | POA: Diagnosis not present

## 2020-07-10 DIAGNOSIS — E7800 Pure hypercholesterolemia, unspecified: Secondary | ICD-10-CM | POA: Diagnosis not present

## 2020-07-10 DIAGNOSIS — E119 Type 2 diabetes mellitus without complications: Secondary | ICD-10-CM | POA: Diagnosis not present

## 2020-07-18 DIAGNOSIS — Z23 Encounter for immunization: Secondary | ICD-10-CM | POA: Diagnosis not present

## 2020-07-28 DIAGNOSIS — Z86010 Personal history of colonic polyps: Secondary | ICD-10-CM | POA: Diagnosis not present

## 2020-08-08 DIAGNOSIS — I2510 Atherosclerotic heart disease of native coronary artery without angina pectoris: Secondary | ICD-10-CM | POA: Diagnosis not present

## 2020-08-08 DIAGNOSIS — I10 Essential (primary) hypertension: Secondary | ICD-10-CM | POA: Diagnosis not present

## 2020-08-08 DIAGNOSIS — E119 Type 2 diabetes mellitus without complications: Secondary | ICD-10-CM | POA: Diagnosis not present

## 2020-08-08 DIAGNOSIS — E7800 Pure hypercholesterolemia, unspecified: Secondary | ICD-10-CM | POA: Diagnosis not present

## 2020-08-11 DIAGNOSIS — E1122 Type 2 diabetes mellitus with diabetic chronic kidney disease: Secondary | ICD-10-CM | POA: Diagnosis not present

## 2020-08-11 DIAGNOSIS — I10 Essential (primary) hypertension: Secondary | ICD-10-CM | POA: Diagnosis not present

## 2020-08-11 DIAGNOSIS — Z299 Encounter for prophylactic measures, unspecified: Secondary | ICD-10-CM | POA: Diagnosis not present

## 2020-08-11 DIAGNOSIS — E1165 Type 2 diabetes mellitus with hyperglycemia: Secondary | ICD-10-CM | POA: Diagnosis not present

## 2020-08-11 DIAGNOSIS — N183 Chronic kidney disease, stage 3 unspecified: Secondary | ICD-10-CM | POA: Diagnosis not present

## 2020-09-09 DIAGNOSIS — I10 Essential (primary) hypertension: Secondary | ICD-10-CM | POA: Diagnosis not present

## 2020-09-09 DIAGNOSIS — E7800 Pure hypercholesterolemia, unspecified: Secondary | ICD-10-CM | POA: Diagnosis not present

## 2020-09-09 DIAGNOSIS — I2510 Atherosclerotic heart disease of native coronary artery without angina pectoris: Secondary | ICD-10-CM | POA: Diagnosis not present

## 2020-09-09 DIAGNOSIS — E119 Type 2 diabetes mellitus without complications: Secondary | ICD-10-CM | POA: Diagnosis not present

## 2020-09-22 DIAGNOSIS — H35033 Hypertensive retinopathy, bilateral: Secondary | ICD-10-CM | POA: Diagnosis not present

## 2020-10-06 DIAGNOSIS — Z7189 Other specified counseling: Secondary | ICD-10-CM | POA: Diagnosis not present

## 2020-10-06 DIAGNOSIS — R5383 Other fatigue: Secondary | ICD-10-CM | POA: Diagnosis not present

## 2020-10-06 DIAGNOSIS — Z299 Encounter for prophylactic measures, unspecified: Secondary | ICD-10-CM | POA: Diagnosis not present

## 2020-10-06 DIAGNOSIS — Z79899 Other long term (current) drug therapy: Secondary | ICD-10-CM | POA: Diagnosis not present

## 2020-10-06 DIAGNOSIS — Z6835 Body mass index (BMI) 35.0-35.9, adult: Secondary | ICD-10-CM | POA: Diagnosis not present

## 2020-10-06 DIAGNOSIS — Z0000 Encounter for general adult medical examination without abnormal findings: Secondary | ICD-10-CM | POA: Diagnosis not present

## 2020-10-06 DIAGNOSIS — E559 Vitamin D deficiency, unspecified: Secondary | ICD-10-CM | POA: Diagnosis not present

## 2020-10-06 DIAGNOSIS — E7800 Pure hypercholesterolemia, unspecified: Secondary | ICD-10-CM | POA: Diagnosis not present

## 2020-10-06 DIAGNOSIS — Z1339 Encounter for screening examination for other mental health and behavioral disorders: Secondary | ICD-10-CM | POA: Diagnosis not present

## 2020-10-06 DIAGNOSIS — Z1331 Encounter for screening for depression: Secondary | ICD-10-CM | POA: Diagnosis not present

## 2020-10-09 DIAGNOSIS — I2510 Atherosclerotic heart disease of native coronary artery without angina pectoris: Secondary | ICD-10-CM | POA: Diagnosis not present

## 2020-10-09 DIAGNOSIS — I10 Essential (primary) hypertension: Secondary | ICD-10-CM | POA: Diagnosis not present

## 2020-10-09 DIAGNOSIS — E7800 Pure hypercholesterolemia, unspecified: Secondary | ICD-10-CM | POA: Diagnosis not present

## 2020-10-09 DIAGNOSIS — E119 Type 2 diabetes mellitus without complications: Secondary | ICD-10-CM | POA: Diagnosis not present

## 2020-10-20 IMAGING — RF DG ERCP WO/W SPHINCTEROTOMY
1 series · 11 of 11 positions shown · non-contrast
Comparison: MRCP 03/21/2019

CLINICAL DATA: Elevated LFTs. Previous cholecystectomy and
sphincterotomy.

EXAM:
ERCP
TECHNIQUE: Multiple spot images obtained with the fluoroscopic device and
submitted for interpretation post-procedure.

[Series 1: run · 8 acquisitions, 11 frames shown]
[im 1/8]
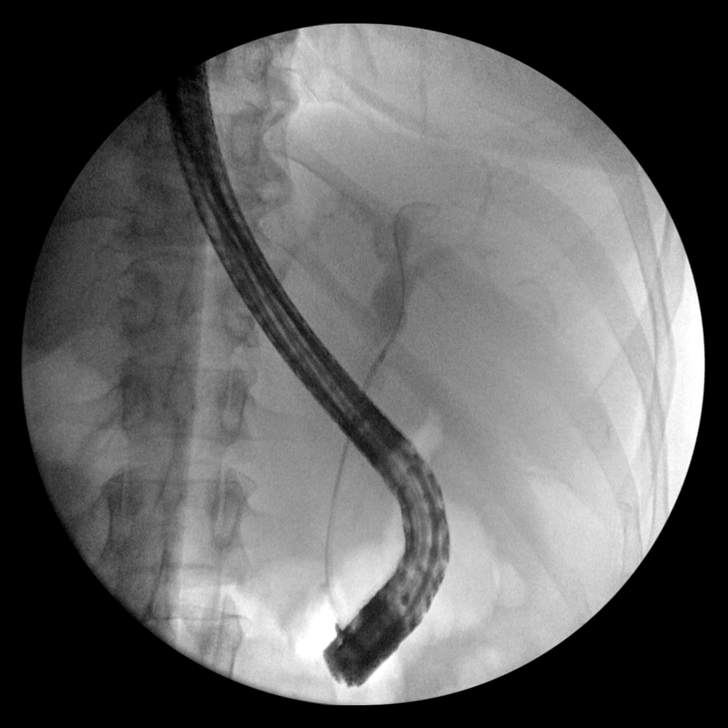
[im 2/8]
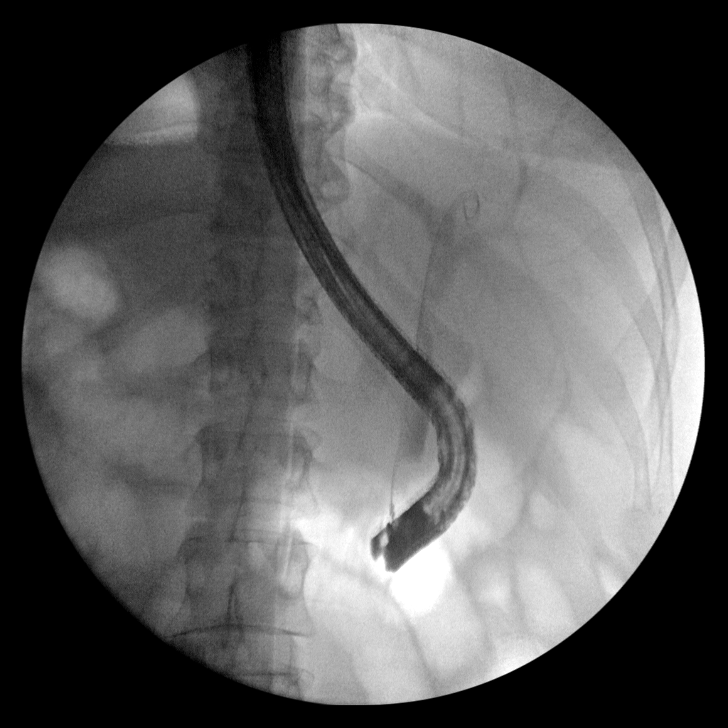
[im 3/8]
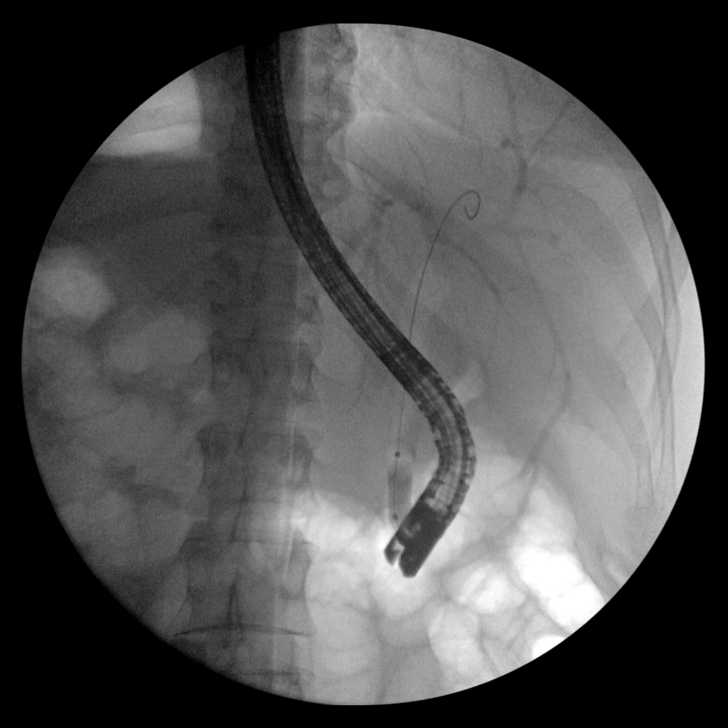
[im 4/8]
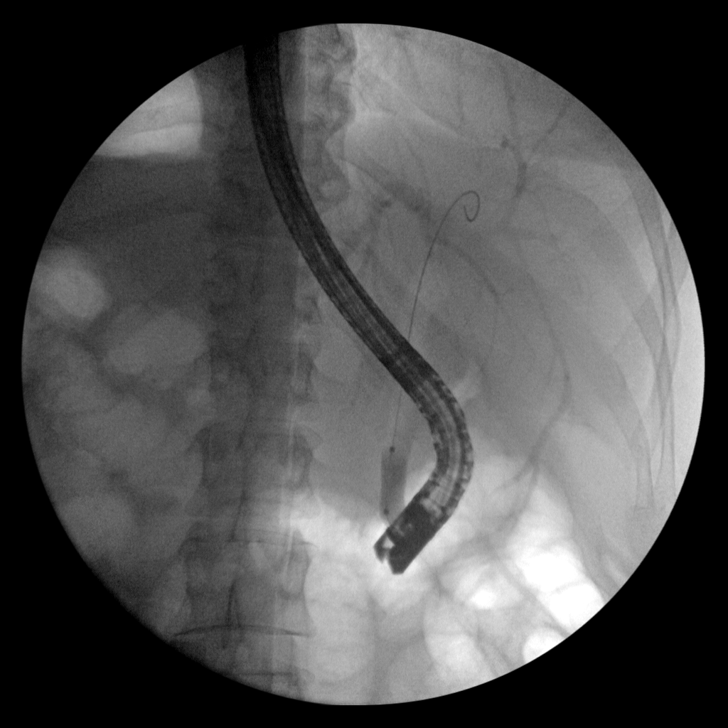
[im 5/8]
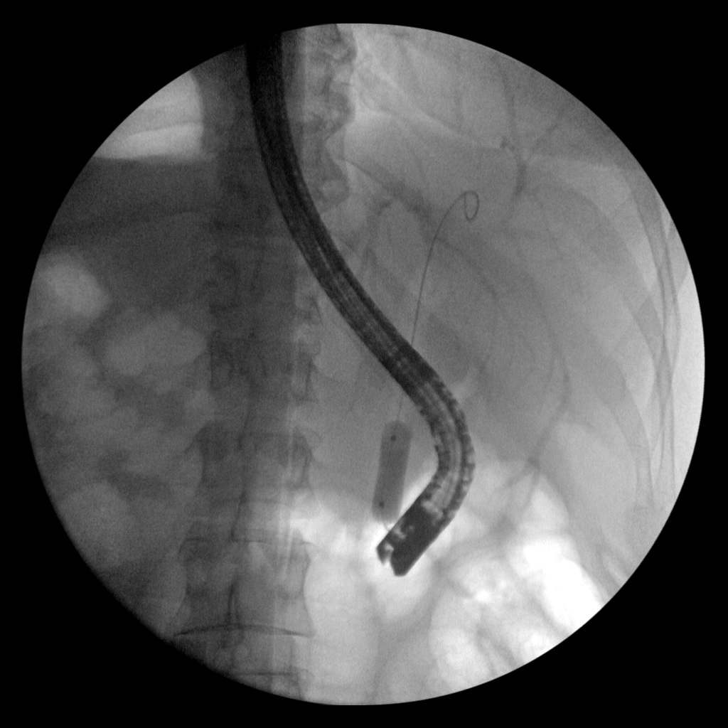
[im 6/8]
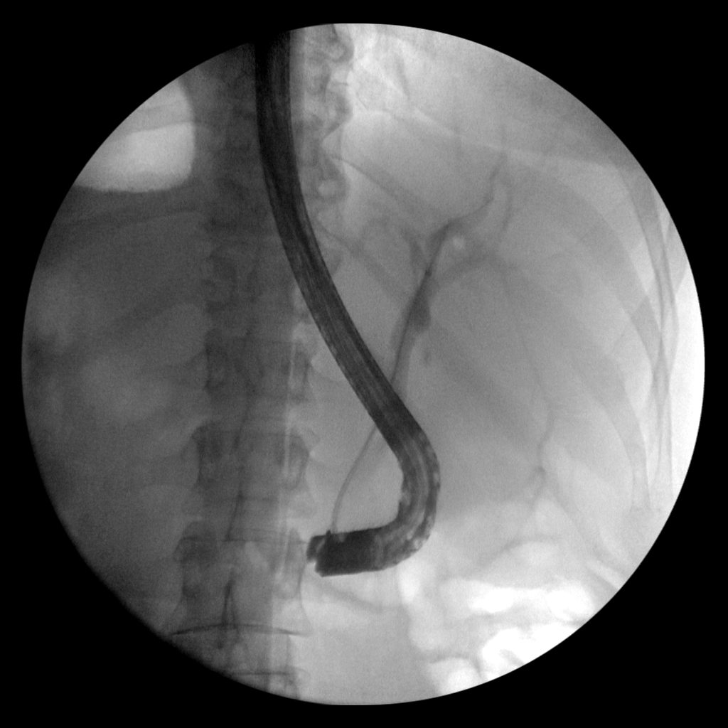
[im 6/8]
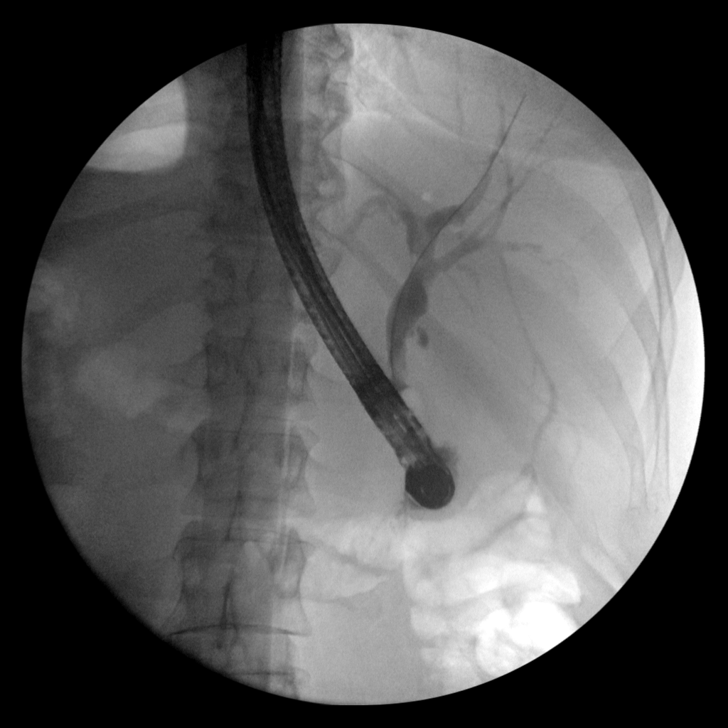
[im 6/8]
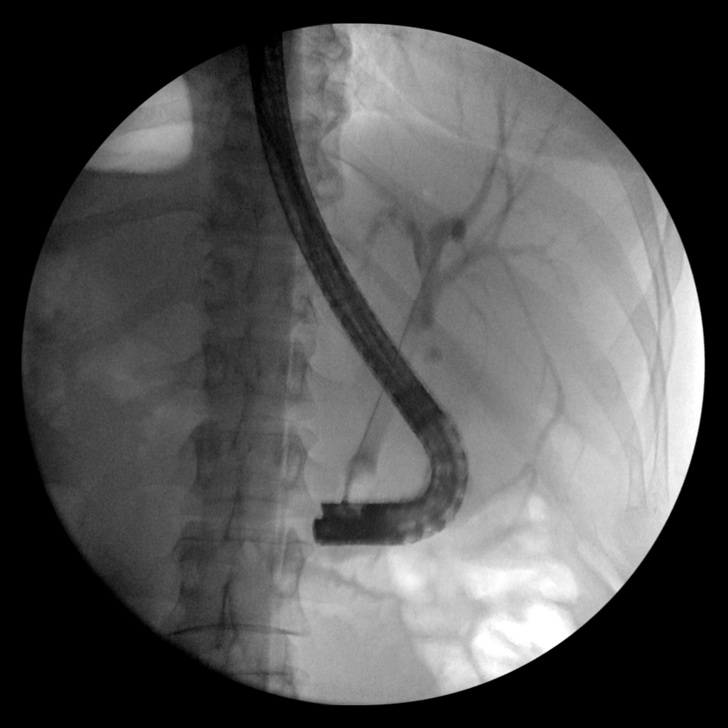
[im 6/8]
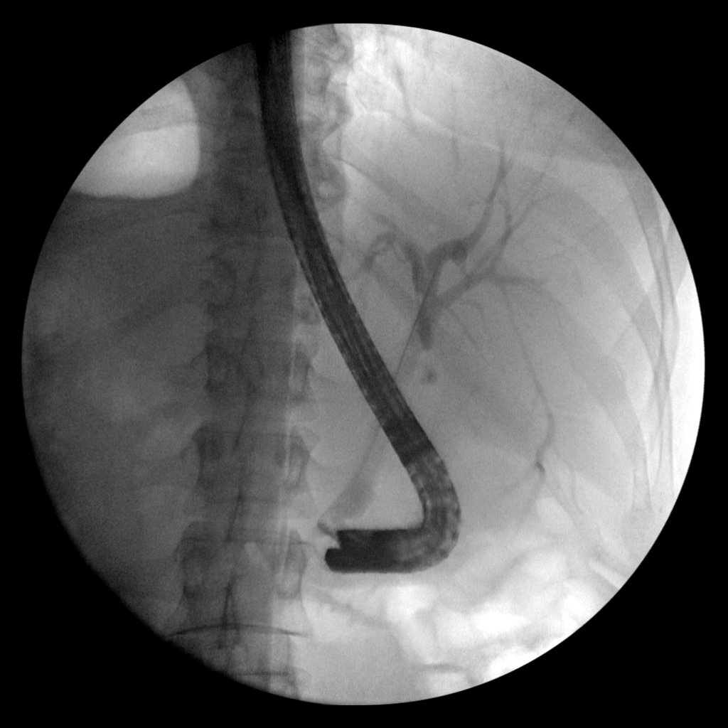
[im 7/8]
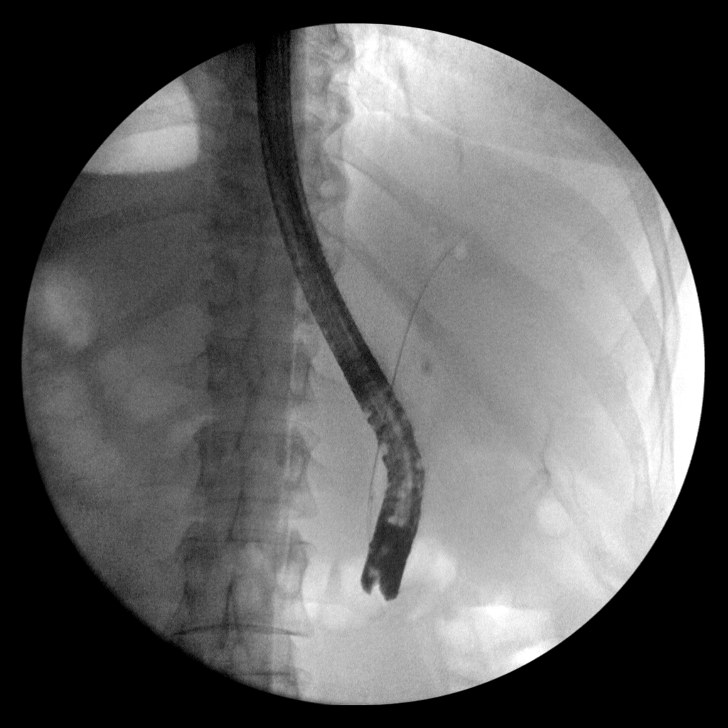
[im 8/8]
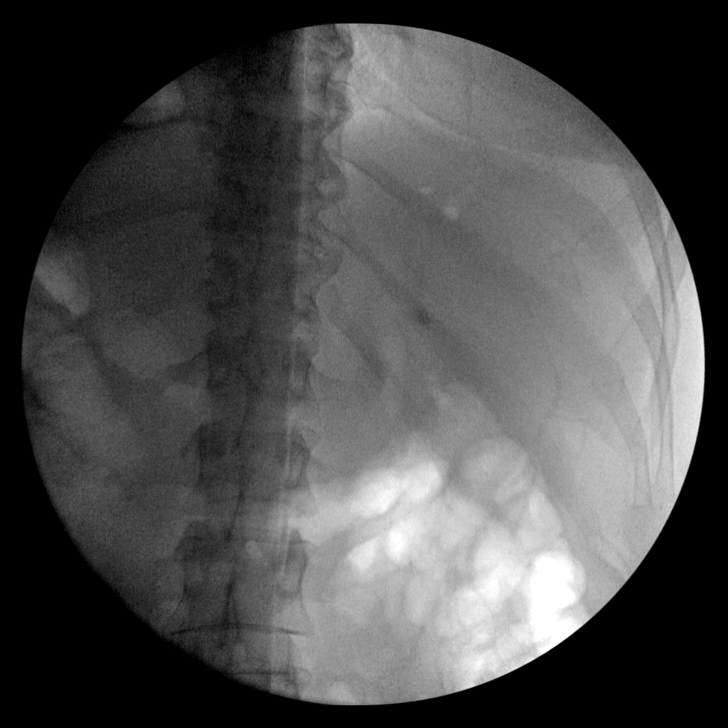

[11 of 11 positions shown; findings below may reference images not displayed]

FINDINGS: A series of fluoroscopic spot images document endoscopic cannulation
and opacification of the CBD with passage of a balloon catheter into
the CBD. Incomplete opacification of the intrahepatic biliary tree.
No extravasation evident.
IMPRESSION: Endoscopic CBD cannulation and intervention.

These images were submitted for radiologic interpretation only.
Please see the procedural report for the amount of contrast and the
fluoroscopy time utilized.

## 2020-11-20 DIAGNOSIS — E1165 Type 2 diabetes mellitus with hyperglycemia: Secondary | ICD-10-CM | POA: Diagnosis not present

## 2020-11-20 DIAGNOSIS — M47819 Spondylosis without myelopathy or radiculopathy, site unspecified: Secondary | ICD-10-CM | POA: Diagnosis not present

## 2020-11-20 DIAGNOSIS — I10 Essential (primary) hypertension: Secondary | ICD-10-CM | POA: Diagnosis not present

## 2020-11-20 DIAGNOSIS — M5431 Sciatica, right side: Secondary | ICD-10-CM | POA: Diagnosis not present

## 2020-11-20 DIAGNOSIS — Z6835 Body mass index (BMI) 35.0-35.9, adult: Secondary | ICD-10-CM | POA: Diagnosis not present

## 2020-11-20 DIAGNOSIS — Z299 Encounter for prophylactic measures, unspecified: Secondary | ICD-10-CM | POA: Diagnosis not present

## 2020-12-04 DIAGNOSIS — E1165 Type 2 diabetes mellitus with hyperglycemia: Secondary | ICD-10-CM | POA: Diagnosis not present

## 2020-12-04 DIAGNOSIS — R1010 Upper abdominal pain, unspecified: Secondary | ICD-10-CM | POA: Diagnosis not present

## 2020-12-04 DIAGNOSIS — Z299 Encounter for prophylactic measures, unspecified: Secondary | ICD-10-CM | POA: Diagnosis not present

## 2020-12-04 DIAGNOSIS — I10 Essential (primary) hypertension: Secondary | ICD-10-CM | POA: Diagnosis not present

## 2020-12-04 DIAGNOSIS — Z6835 Body mass index (BMI) 35.0-35.9, adult: Secondary | ICD-10-CM | POA: Diagnosis not present

## 2020-12-08 DIAGNOSIS — I10 Essential (primary) hypertension: Secondary | ICD-10-CM | POA: Diagnosis not present

## 2020-12-08 DIAGNOSIS — K219 Gastro-esophageal reflux disease without esophagitis: Secondary | ICD-10-CM | POA: Diagnosis not present

## 2020-12-08 DIAGNOSIS — E119 Type 2 diabetes mellitus without complications: Secondary | ICD-10-CM | POA: Diagnosis not present

## 2020-12-08 DIAGNOSIS — E7800 Pure hypercholesterolemia, unspecified: Secondary | ICD-10-CM | POA: Diagnosis not present

## 2020-12-25 DIAGNOSIS — H04123 Dry eye syndrome of bilateral lacrimal glands: Secondary | ICD-10-CM | POA: Diagnosis not present

## 2021-01-01 DIAGNOSIS — I10 Essential (primary) hypertension: Secondary | ICD-10-CM | POA: Diagnosis not present

## 2021-01-01 DIAGNOSIS — K219 Gastro-esophageal reflux disease without esophagitis: Secondary | ICD-10-CM | POA: Diagnosis not present

## 2021-01-01 DIAGNOSIS — E1129 Type 2 diabetes mellitus with other diabetic kidney complication: Secondary | ICD-10-CM | POA: Diagnosis not present

## 2021-01-01 DIAGNOSIS — R809 Proteinuria, unspecified: Secondary | ICD-10-CM | POA: Diagnosis not present

## 2021-01-01 DIAGNOSIS — Z6835 Body mass index (BMI) 35.0-35.9, adult: Secondary | ICD-10-CM | POA: Diagnosis not present

## 2021-01-01 DIAGNOSIS — Z299 Encounter for prophylactic measures, unspecified: Secondary | ICD-10-CM | POA: Diagnosis not present

## 2021-01-02 DIAGNOSIS — J209 Acute bronchitis, unspecified: Secondary | ICD-10-CM | POA: Diagnosis not present

## 2021-01-02 DIAGNOSIS — E669 Obesity, unspecified: Secondary | ICD-10-CM | POA: Diagnosis not present

## 2021-01-02 DIAGNOSIS — Z6834 Body mass index (BMI) 34.0-34.9, adult: Secondary | ICD-10-CM | POA: Diagnosis not present

## 2021-01-02 DIAGNOSIS — Z20822 Contact with and (suspected) exposure to covid-19: Secondary | ICD-10-CM | POA: Diagnosis not present

## 2021-01-08 DIAGNOSIS — I10 Essential (primary) hypertension: Secondary | ICD-10-CM | POA: Diagnosis not present

## 2021-02-04 DIAGNOSIS — M25512 Pain in left shoulder: Secondary | ICD-10-CM | POA: Diagnosis not present

## 2021-02-04 DIAGNOSIS — Z299 Encounter for prophylactic measures, unspecified: Secondary | ICD-10-CM | POA: Diagnosis not present

## 2021-02-04 DIAGNOSIS — E1165 Type 2 diabetes mellitus with hyperglycemia: Secondary | ICD-10-CM | POA: Diagnosis not present

## 2021-02-04 DIAGNOSIS — M549 Dorsalgia, unspecified: Secondary | ICD-10-CM | POA: Diagnosis not present

## 2021-02-04 DIAGNOSIS — I10 Essential (primary) hypertension: Secondary | ICD-10-CM | POA: Diagnosis not present

## 2021-02-04 DIAGNOSIS — Z6835 Body mass index (BMI) 35.0-35.9, adult: Secondary | ICD-10-CM | POA: Diagnosis not present

## 2021-02-06 DIAGNOSIS — I10 Essential (primary) hypertension: Secondary | ICD-10-CM | POA: Diagnosis not present

## 2021-02-11 DIAGNOSIS — M25512 Pain in left shoulder: Secondary | ICD-10-CM | POA: Diagnosis not present

## 2021-02-11 DIAGNOSIS — M6281 Muscle weakness (generalized): Secondary | ICD-10-CM | POA: Diagnosis not present

## 2021-02-11 DIAGNOSIS — M549 Dorsalgia, unspecified: Secondary | ICD-10-CM | POA: Diagnosis not present

## 2021-02-11 DIAGNOSIS — M25612 Stiffness of left shoulder, not elsewhere classified: Secondary | ICD-10-CM | POA: Diagnosis not present

## 2021-02-13 DIAGNOSIS — M25512 Pain in left shoulder: Secondary | ICD-10-CM | POA: Diagnosis not present

## 2021-02-13 DIAGNOSIS — M6281 Muscle weakness (generalized): Secondary | ICD-10-CM | POA: Diagnosis not present

## 2021-02-13 DIAGNOSIS — M25612 Stiffness of left shoulder, not elsewhere classified: Secondary | ICD-10-CM | POA: Diagnosis not present

## 2021-02-13 DIAGNOSIS — M549 Dorsalgia, unspecified: Secondary | ICD-10-CM | POA: Diagnosis not present

## 2021-02-16 DIAGNOSIS — M549 Dorsalgia, unspecified: Secondary | ICD-10-CM | POA: Diagnosis not present

## 2021-02-16 DIAGNOSIS — M25612 Stiffness of left shoulder, not elsewhere classified: Secondary | ICD-10-CM | POA: Diagnosis not present

## 2021-02-16 DIAGNOSIS — M6281 Muscle weakness (generalized): Secondary | ICD-10-CM | POA: Diagnosis not present

## 2021-02-16 DIAGNOSIS — M25512 Pain in left shoulder: Secondary | ICD-10-CM | POA: Diagnosis not present

## 2021-02-18 DIAGNOSIS — M549 Dorsalgia, unspecified: Secondary | ICD-10-CM | POA: Diagnosis not present

## 2021-02-18 DIAGNOSIS — M6281 Muscle weakness (generalized): Secondary | ICD-10-CM | POA: Diagnosis not present

## 2021-02-18 DIAGNOSIS — M25512 Pain in left shoulder: Secondary | ICD-10-CM | POA: Diagnosis not present

## 2021-02-18 DIAGNOSIS — M25612 Stiffness of left shoulder, not elsewhere classified: Secondary | ICD-10-CM | POA: Diagnosis not present

## 2021-02-23 DIAGNOSIS — M549 Dorsalgia, unspecified: Secondary | ICD-10-CM | POA: Diagnosis not present

## 2021-02-23 DIAGNOSIS — M25612 Stiffness of left shoulder, not elsewhere classified: Secondary | ICD-10-CM | POA: Diagnosis not present

## 2021-02-23 DIAGNOSIS — M6281 Muscle weakness (generalized): Secondary | ICD-10-CM | POA: Diagnosis not present

## 2021-02-23 DIAGNOSIS — M25512 Pain in left shoulder: Secondary | ICD-10-CM | POA: Diagnosis not present

## 2021-02-27 DIAGNOSIS — M6281 Muscle weakness (generalized): Secondary | ICD-10-CM | POA: Diagnosis not present

## 2021-02-27 DIAGNOSIS — M25512 Pain in left shoulder: Secondary | ICD-10-CM | POA: Diagnosis not present

## 2021-02-27 DIAGNOSIS — M549 Dorsalgia, unspecified: Secondary | ICD-10-CM | POA: Diagnosis not present

## 2021-02-27 DIAGNOSIS — M25612 Stiffness of left shoulder, not elsewhere classified: Secondary | ICD-10-CM | POA: Diagnosis not present

## 2021-03-02 DIAGNOSIS — M25512 Pain in left shoulder: Secondary | ICD-10-CM | POA: Diagnosis not present

## 2021-03-02 DIAGNOSIS — M25612 Stiffness of left shoulder, not elsewhere classified: Secondary | ICD-10-CM | POA: Diagnosis not present

## 2021-03-02 DIAGNOSIS — M6281 Muscle weakness (generalized): Secondary | ICD-10-CM | POA: Diagnosis not present

## 2021-03-02 DIAGNOSIS — M549 Dorsalgia, unspecified: Secondary | ICD-10-CM | POA: Diagnosis not present

## 2021-03-04 DIAGNOSIS — M6281 Muscle weakness (generalized): Secondary | ICD-10-CM | POA: Diagnosis not present

## 2021-03-04 DIAGNOSIS — M25612 Stiffness of left shoulder, not elsewhere classified: Secondary | ICD-10-CM | POA: Diagnosis not present

## 2021-03-04 DIAGNOSIS — M25512 Pain in left shoulder: Secondary | ICD-10-CM | POA: Diagnosis not present

## 2021-03-04 DIAGNOSIS — M549 Dorsalgia, unspecified: Secondary | ICD-10-CM | POA: Diagnosis not present

## 2021-03-06 DIAGNOSIS — I10 Essential (primary) hypertension: Secondary | ICD-10-CM | POA: Diagnosis not present

## 2021-03-06 DIAGNOSIS — Z6836 Body mass index (BMI) 36.0-36.9, adult: Secondary | ICD-10-CM | POA: Diagnosis not present

## 2021-03-06 DIAGNOSIS — N183 Chronic kidney disease, stage 3 unspecified: Secondary | ICD-10-CM | POA: Diagnosis not present

## 2021-03-06 DIAGNOSIS — E1165 Type 2 diabetes mellitus with hyperglycemia: Secondary | ICD-10-CM | POA: Diagnosis not present

## 2021-03-06 DIAGNOSIS — E1122 Type 2 diabetes mellitus with diabetic chronic kidney disease: Secondary | ICD-10-CM | POA: Diagnosis not present

## 2021-03-06 DIAGNOSIS — Z299 Encounter for prophylactic measures, unspecified: Secondary | ICD-10-CM | POA: Diagnosis not present

## 2021-03-09 DIAGNOSIS — I10 Essential (primary) hypertension: Secondary | ICD-10-CM | POA: Diagnosis not present

## 2021-03-09 DIAGNOSIS — E7800 Pure hypercholesterolemia, unspecified: Secondary | ICD-10-CM | POA: Diagnosis not present

## 2021-03-09 DIAGNOSIS — E119 Type 2 diabetes mellitus without complications: Secondary | ICD-10-CM | POA: Diagnosis not present

## 2021-03-09 DIAGNOSIS — K219 Gastro-esophageal reflux disease without esophagitis: Secondary | ICD-10-CM | POA: Diagnosis not present

## 2021-03-10 DIAGNOSIS — I10 Essential (primary) hypertension: Secondary | ICD-10-CM | POA: Diagnosis not present

## 2021-04-09 DIAGNOSIS — I10 Essential (primary) hypertension: Secondary | ICD-10-CM | POA: Diagnosis not present

## 2021-04-09 DIAGNOSIS — E7800 Pure hypercholesterolemia, unspecified: Secondary | ICD-10-CM | POA: Diagnosis not present

## 2021-04-09 DIAGNOSIS — E119 Type 2 diabetes mellitus without complications: Secondary | ICD-10-CM | POA: Diagnosis not present

## 2021-04-09 DIAGNOSIS — K219 Gastro-esophageal reflux disease without esophagitis: Secondary | ICD-10-CM | POA: Diagnosis not present

## 2021-05-04 DIAGNOSIS — N898 Other specified noninflammatory disorders of vagina: Secondary | ICD-10-CM | POA: Diagnosis not present

## 2021-05-04 DIAGNOSIS — L280 Lichen simplex chronicus: Secondary | ICD-10-CM | POA: Diagnosis not present

## 2021-05-08 DIAGNOSIS — I10 Essential (primary) hypertension: Secondary | ICD-10-CM | POA: Diagnosis not present

## 2021-05-15 DIAGNOSIS — Z299 Encounter for prophylactic measures, unspecified: Secondary | ICD-10-CM | POA: Diagnosis not present

## 2021-05-15 DIAGNOSIS — Z6836 Body mass index (BMI) 36.0-36.9, adult: Secondary | ICD-10-CM | POA: Diagnosis not present

## 2021-05-15 DIAGNOSIS — Z713 Dietary counseling and surveillance: Secondary | ICD-10-CM | POA: Diagnosis not present

## 2021-05-15 DIAGNOSIS — E1165 Type 2 diabetes mellitus with hyperglycemia: Secondary | ICD-10-CM | POA: Diagnosis not present

## 2021-06-03 DIAGNOSIS — L280 Lichen simplex chronicus: Secondary | ICD-10-CM | POA: Diagnosis not present

## 2021-06-03 DIAGNOSIS — N898 Other specified noninflammatory disorders of vagina: Secondary | ICD-10-CM | POA: Diagnosis not present

## 2021-06-03 DIAGNOSIS — E1121 Type 2 diabetes mellitus with diabetic nephropathy: Secondary | ICD-10-CM | POA: Diagnosis not present

## 2021-06-09 DIAGNOSIS — E1165 Type 2 diabetes mellitus with hyperglycemia: Secondary | ICD-10-CM | POA: Diagnosis not present

## 2021-06-09 DIAGNOSIS — Z299 Encounter for prophylactic measures, unspecified: Secondary | ICD-10-CM | POA: Diagnosis not present

## 2021-06-09 DIAGNOSIS — R609 Edema, unspecified: Secondary | ICD-10-CM | POA: Diagnosis not present

## 2021-06-09 DIAGNOSIS — E1129 Type 2 diabetes mellitus with other diabetic kidney complication: Secondary | ICD-10-CM | POA: Diagnosis not present

## 2021-06-09 DIAGNOSIS — Z6836 Body mass index (BMI) 36.0-36.9, adult: Secondary | ICD-10-CM | POA: Diagnosis not present

## 2021-06-09 DIAGNOSIS — M1990 Unspecified osteoarthritis, unspecified site: Secondary | ICD-10-CM | POA: Diagnosis not present

## 2021-06-09 DIAGNOSIS — R809 Proteinuria, unspecified: Secondary | ICD-10-CM | POA: Diagnosis not present

## 2021-06-09 DIAGNOSIS — I10 Essential (primary) hypertension: Secondary | ICD-10-CM | POA: Diagnosis not present

## 2021-06-10 DIAGNOSIS — I10 Essential (primary) hypertension: Secondary | ICD-10-CM | POA: Diagnosis not present

## 2021-06-16 DIAGNOSIS — M19042 Primary osteoarthritis, left hand: Secondary | ICD-10-CM | POA: Diagnosis not present

## 2021-06-16 DIAGNOSIS — M19041 Primary osteoarthritis, right hand: Secondary | ICD-10-CM | POA: Diagnosis not present

## 2021-06-16 DIAGNOSIS — M19012 Primary osteoarthritis, left shoulder: Secondary | ICD-10-CM | POA: Diagnosis not present

## 2021-06-29 DIAGNOSIS — E11319 Type 2 diabetes mellitus with unspecified diabetic retinopathy without macular edema: Secondary | ICD-10-CM | POA: Diagnosis not present

## 2021-07-10 DIAGNOSIS — E7800 Pure hypercholesterolemia, unspecified: Secondary | ICD-10-CM | POA: Diagnosis not present

## 2021-07-10 DIAGNOSIS — I10 Essential (primary) hypertension: Secondary | ICD-10-CM | POA: Diagnosis not present

## 2021-07-13 DIAGNOSIS — Z23 Encounter for immunization: Secondary | ICD-10-CM | POA: Diagnosis not present

## 2021-08-10 DIAGNOSIS — I10 Essential (primary) hypertension: Secondary | ICD-10-CM | POA: Diagnosis not present

## 2021-09-09 DIAGNOSIS — I10 Essential (primary) hypertension: Secondary | ICD-10-CM | POA: Diagnosis not present

## 2021-09-09 DIAGNOSIS — M109 Gout, unspecified: Secondary | ICD-10-CM | POA: Diagnosis not present

## 2021-09-09 DIAGNOSIS — E119 Type 2 diabetes mellitus without complications: Secondary | ICD-10-CM | POA: Diagnosis not present

## 2021-09-09 DIAGNOSIS — E7800 Pure hypercholesterolemia, unspecified: Secondary | ICD-10-CM | POA: Diagnosis not present

## 2021-09-11 DIAGNOSIS — R072 Precordial pain: Secondary | ICD-10-CM | POA: Diagnosis not present

## 2021-09-11 DIAGNOSIS — E119 Type 2 diabetes mellitus without complications: Secondary | ICD-10-CM | POA: Diagnosis not present

## 2021-09-11 DIAGNOSIS — N289 Disorder of kidney and ureter, unspecified: Secondary | ICD-10-CM | POA: Diagnosis not present

## 2021-09-11 DIAGNOSIS — R079 Chest pain, unspecified: Secondary | ICD-10-CM | POA: Diagnosis not present

## 2021-09-11 DIAGNOSIS — I10 Essential (primary) hypertension: Secondary | ICD-10-CM | POA: Diagnosis not present

## 2021-09-15 DIAGNOSIS — Z6836 Body mass index (BMI) 36.0-36.9, adult: Secondary | ICD-10-CM | POA: Diagnosis not present

## 2021-09-15 DIAGNOSIS — I10 Essential (primary) hypertension: Secondary | ICD-10-CM | POA: Diagnosis not present

## 2021-09-15 DIAGNOSIS — E559 Vitamin D deficiency, unspecified: Secondary | ICD-10-CM | POA: Diagnosis not present

## 2021-09-15 DIAGNOSIS — Z79899 Other long term (current) drug therapy: Secondary | ICD-10-CM | POA: Diagnosis not present

## 2021-09-15 DIAGNOSIS — E1165 Type 2 diabetes mellitus with hyperglycemia: Secondary | ICD-10-CM | POA: Diagnosis not present

## 2021-09-15 DIAGNOSIS — Z0000 Encounter for general adult medical examination without abnormal findings: Secondary | ICD-10-CM | POA: Diagnosis not present

## 2021-09-15 DIAGNOSIS — Z1339 Encounter for screening examination for other mental health and behavioral disorders: Secondary | ICD-10-CM | POA: Diagnosis not present

## 2021-09-15 DIAGNOSIS — E7800 Pure hypercholesterolemia, unspecified: Secondary | ICD-10-CM | POA: Diagnosis not present

## 2021-09-15 DIAGNOSIS — Z1331 Encounter for screening for depression: Secondary | ICD-10-CM | POA: Diagnosis not present

## 2021-09-15 DIAGNOSIS — Z299 Encounter for prophylactic measures, unspecified: Secondary | ICD-10-CM | POA: Diagnosis not present

## 2021-09-15 DIAGNOSIS — R079 Chest pain, unspecified: Secondary | ICD-10-CM | POA: Diagnosis not present

## 2021-09-15 DIAGNOSIS — R5383 Other fatigue: Secondary | ICD-10-CM | POA: Diagnosis not present

## 2021-09-15 DIAGNOSIS — Z7189 Other specified counseling: Secondary | ICD-10-CM | POA: Diagnosis not present

## 2021-09-18 DIAGNOSIS — R079 Chest pain, unspecified: Secondary | ICD-10-CM | POA: Diagnosis not present

## 2021-09-21 DIAGNOSIS — R079 Chest pain, unspecified: Secondary | ICD-10-CM | POA: Diagnosis not present

## 2021-09-22 DIAGNOSIS — R079 Chest pain, unspecified: Secondary | ICD-10-CM | POA: Diagnosis not present

## 2021-09-28 DIAGNOSIS — J309 Allergic rhinitis, unspecified: Secondary | ICD-10-CM | POA: Diagnosis not present

## 2021-09-28 DIAGNOSIS — U071 COVID-19: Secondary | ICD-10-CM | POA: Diagnosis not present

## 2021-09-28 DIAGNOSIS — Z299 Encounter for prophylactic measures, unspecified: Secondary | ICD-10-CM | POA: Diagnosis not present

## 2021-09-28 DIAGNOSIS — Z6836 Body mass index (BMI) 36.0-36.9, adult: Secondary | ICD-10-CM | POA: Diagnosis not present

## 2021-09-28 DIAGNOSIS — J029 Acute pharyngitis, unspecified: Secondary | ICD-10-CM | POA: Diagnosis not present

## 2021-09-30 DIAGNOSIS — R0981 Nasal congestion: Secondary | ICD-10-CM | POA: Diagnosis not present

## 2021-09-30 DIAGNOSIS — U071 COVID-19: Secondary | ICD-10-CM | POA: Diagnosis not present

## 2021-09-30 DIAGNOSIS — Z20822 Contact with and (suspected) exposure to covid-19: Secondary | ICD-10-CM | POA: Diagnosis not present

## 2021-10-09 ENCOUNTER — Ambulatory Visit: Payer: Medicare Other | Admitting: Cardiology

## 2021-10-09 DIAGNOSIS — I10 Essential (primary) hypertension: Secondary | ICD-10-CM | POA: Diagnosis not present

## 2021-10-09 NOTE — Progress Notes (Deleted)
Clinical Summary Ms. Messamore is a 70 y.o.female seen as a new consult, referred by Dr Manuella Ghazi for the following medical problems.  Chest pain  -09/2021 nuclear stress [facility]: no ischemia, small possible scar lateral apex, EKG without ischemic changes -   Past Medical History:  Diagnosis Date   Allergic rhinitis    Arthritis    kneees, back    Bursitis of right hip    Cancer ([place])    breast cancer, right   Cerumen impaction    Chronic kidney disease    appt. schedule for Dr. Lynnette Caffey, october 2020   Complication of anesthesia    Diabetes mellitus without complication ([place])    Type II   Dizziness    Fatigue    Frequent urination    Gout    Heart murmur    Echo11/21/16 ([facility]): LVEF 50-55%, reduced LV diastolic function, mild TR    Hypercholesteremia    Hypertension    Hypokalemia    Left foot pain    Left shoulder pain    Lumbago    Oral candida    Ovarian failure    Plantar fascial fibromatosis    PONV (postoperative nausea and vomiting)    Proteinuria    Right knee pain      Allergies  Allergen Reactions   Ace Inhibitors     Cough    Actos [Pioglitazone]     Edema    Lipitor [Atorvastatin]     Chest tightness    Metformin And Related Diarrhea     Current Outpatient Medications  Medication Sig Dispense Refill   allopurinol (ZYLOPRIM) 100 MG tablet Take 100 mg by mouth daily.      amLODipine (NORVASC) 5 MG tablet Take 5 mg by mouth daily.     aspirin EC 81 MG tablet Take 81 mg by mouth daily.     bumetanide (BUMEX) 1 MG tablet Take 1 mg by mouth daily.     Camphor-Menthol-Methyl Sal (SALONPAS) 3.10-16-08 % PTCH Place 1 patch onto the skin daily as needed (pain).     cetirizine (ZYRTEC) 10 MG tablet Take 10 mg by mouth daily.     diclofenac sodium (VOLTAREN) 1 % GEL Apply 2 g topically 2 (two) times daily as needed (pain).     doxazosin (CARDURA XL) 4 MG 24 hr tablet Take 4 mg by mouth at bedtime.     Exenatide ER (BYDUREON BCISE) 2 MG/0.85ML AUIJ  Inject 2 mg into the skin every Saturday.      fluticasone (FLONASE) 50 MCG/ACT nasal spray Place 1 spray into both nostrils daily as needed (allergies.).      glimepiride (AMARYL) 4 MG tablet Take 4 mg by mouth daily with breakfast.      glucose blood test strip 1 each by Other route as needed for other. Use as instructed     Insulin Pen Needle (NOVOFINE) 32G X 6 MM MISC by Does not apply route.     losartan (COZAAR) 100 MG tablet Take 100 mg by mouth daily.     metoprolol succinate (TOPROL-XL) 100 MG 24 hr tablet Take 100 mg by mouth daily with breakfast. Take with or immediately following a meal.      pantoprazole (PROTONIX) 40 MG tablet Take 40 mg by mouth daily before breakfast.     Polyethyl Glycol-Propyl Glycol (LUBRICANT EYE DROPS) 0.4-0.3 % SOLN Place 1 drop into both eyes 3 (three) times daily as needed (dry/irritated eyes.).  polyethylene glycol (MIRALAX / GLYCOLAX) 17 g packet Take 17 g by mouth daily as needed (constipation.).     potassium chloride (K-DUR) 10 MEQ tablet Take 20 mEq by mouth daily.      rosuvastatin (CRESTOR) 10 MG tablet Take 10 mg by mouth at bedtime.      tiZANidine (ZANAFLEX) 4 MG tablet Take 4 mg by mouth every 8 (eight) hours as needed for muscle spasms.      TOUJEO SOLOSTAR 300 UNIT/ML SOPN Inject 45 Units into the skin at bedtime.     No current facility-administered medications for this visit.     Past Surgical History:  Procedure Laterality Date   ABDOMINAL HYSTERECTOMY     complete   APPENDECTOMY     BILIARY BRUSHING  02/07/2019   Procedure: BILIARY BRUSHING;  Surgeon: Rush Landmark Telford Nab., MD;  Location: [place];  Service: Gastroenterology;;   BILIARY BRUSHING  07/04/2019   Procedure: BILIARY BRUSHING;  Surgeon: Irving Copas., MD;  Location: [facility];  Service: Gastroenterology;;   BILIARY DILATION  02/07/2019   Procedure: BILIARY DILATION;  Surgeon: Irving Copas., MD;  Location: [facility];  Service:  Gastroenterology;;   BILIARY DILATION  07/04/2019   Procedure: BILIARY DILATION;  Surgeon: Irving Copas., MD;  Location: [facility];  Service: Gastroenterology;;   BIOPSY  02/07/2019   Procedure: BIOPSY;  Surgeon: Irving Copas., MD;  Location: [place];  Service: Gastroenterology;;   BIOPSY  07/04/2019   Procedure: BIOPSY;  Surgeon: Irving Copas., MD;  Location: [facility];  Service: Gastroenterology;;   BREAST SURGERY Right    for Breast Ca, tx with surg. & chemo & radiation     CATARACT EXTRACTION W/PHACO Left 08/06/2019   Procedure: CATARACT EXTRACTION PHACO AND INTRAOCULAR LENS PLACEMENT LEFT EYE;  Surgeon: Baruch Goldmann, MD;  Location: [facility];  Service: Ophthalmology;  Laterality: Left;  CDE: 9.01   CATARACT EXTRACTION W/PHACO Right 08/20/2019   Procedure: CATARACT EXTRACTION PHACO AND INTRAOCULAR LENS PLACEMENT RIGHT EYE CDE=5.88;  Surgeon: Baruch Goldmann, MD;  Location: [facility];  Service: Ophthalmology;  Laterality: Right;  right   CHOLECYSTECTOMY     COLONOSCOPY     ENDOSCOPIC RETROGRADE CHOLANGIOPANCREATOGRAPHY (ERCP) WITH PROPOFOL N/A 07/04/2019   Procedure: ENDOSCOPIC RETROGRADE CHOLANGIOPANCREATOGRAPHY (ERCP) WITH PROPOFOL;  Surgeon: Rush Landmark Telford Nab., MD;  Location: [facility];  Service: Gastroenterology;  Laterality: N/A;   ERCP N/A 02/07/2019   Procedure: ENDOSCOPIC RETROGRADE CHOLANGIOPANCREATOGRAPHY (ERCP);  Surgeon: Irving Copas., MD;  Location: [place];  Service: Gastroenterology;  Laterality: N/A;   ESOPHAGOGASTRODUODENOSCOPY (EGD) WITH PROPOFOL N/A 02/07/2019   Procedure: ESOPHAGOGASTRODUODENOSCOPY (EGD) WITH PROPOFOL;  Surgeon: Rush Landmark Telford Nab., MD;  Location: [place];  Service: Gastroenterology;  Laterality: N/A;   ESOPHAGOGASTRODUODENOSCOPY (EGD) WITH PROPOFOL N/A 07/04/2019   Procedure: ESOPHAGOGASTRODUODENOSCOPY (EGD) WITH PROPOFOL;  Surgeon: Rush Landmark Telford Nab., MD;  Location: [facility];  Service:  Gastroenterology;  Laterality: N/A;   EUS N/A 07/04/2019   Procedure: UPPER ENDOSCOPIC ULTRASOUND (EUS) RADIAL;  Surgeon: Irving Copas., MD;  Location: [facility];  Service: Gastroenterology;  Laterality: N/A;   JOINT REPLACEMENT Bilateral    knee   KNEE ARTHROPLASTY Right 05/17/2016   Procedure: RIGHT TOTAL KNEE ARTHROPLASTY WITH COMPUTER NAVIGATION;  Surgeon: Rod Can, MD;  Location: [place];  Service: Orthopedics;  Laterality: Right;  Needs RNFA   KNEE ARTHROPLASTY Left 01/24/2017   Procedure: LEFT TOTAL KNEE ARTHROPLASTY WITH COMPUTER NAVIGATION;  Surgeon: Rod Can, MD;  Location: [place];  Service: Orthopedics;  Laterality: Left;  Dr. requesting RNFA   LAPAROSCOPY  1999   done at [facility], "something with separating my organs because things had grown together"   REMOVAL OF STONES  02/07/2019   Procedure: REMOVAL OF STONES;  Surgeon: Irving Copas., MD;  Location: [place];  Service: Gastroenterology;;   [name]  02/07/2019   Procedure: [name];  Surgeon: Irving Copas., MD;  Location: [place];  Service: Gastroenterology;;   TONSILLECTOMY     TUBAL LIGATION     UPPER ESOPHAGEAL ENDOSCOPIC ULTRASOUND (EUS)  02/07/2019   Procedure: UPPER ESOPHAGEAL ENDOSCOPIC ULTRASOUND (EUS);  Surgeon: Rush Landmark Telford Nab., MD;  Location: [facility];  Service: Gastroenterology;;     Allergies  Allergen Reactions   Ace Inhibitors     Cough    Actos [Pioglitazone]     Edema    Lipitor [Atorvastatin]     Chest tightness    Metformin And Related Diarrhea      Family History  Problem Relation Age of Onset   Breast cancer Sister        age 38     Social History Ms. Soliday reports that she has never smoked. She has never used smokeless tobacco. Ms. Bonnette reports no history of alcohol use.   Review of Systems CONSTITUTIONAL: No weight loss, fever, chills, weakness or fatigue.  HEENT: Eyes: No visual loss, blurred vision,  double vision or yellow sclerae.No hearing loss, sneezing, congestion, runny nose or sore throat.  SKIN: No rash or itching.  CARDIOVASCULAR:  RESPIRATORY: No shortness of breath, cough or sputum.  GASTROINTESTINAL: No anorexia, nausea, vomiting or diarrhea. No abdominal pain or blood.  GENITOURINARY: No burning on urination, no polyuria NEUROLOGICAL: No headache, dizziness, syncope, paralysis, ataxia, numbness or tingling in the extremities. No change in bowel or bladder control.  MUSCULOSKELETAL: No muscle, back pain, joint pain or stiffness.  LYMPHATICS: No enlarged nodes. No history of splenectomy.  PSYCHIATRIC: No history of depression or anxiety.  ENDOCRINOLOGIC: No reports of sweating, cold or heat intolerance. No polyuria or polydipsia.  Marland Kitchen   Physical Examination There were no vitals filed for this visit. There were no vitals filed for this visit.  Gen: resting comfortably, no acute distress HEENT: no scleral icterus, pupils equal round and reactive, no palptable cervical adenopathy,  CV Resp: Clear to auscultation bilaterally GI: abdomen is soft, non-tender, non-distended, normal bowel sounds, no hepatosplenomegaly MSK: extremities are warm, no edema.  Skin: warm, no rash Neuro:  no focal deficits Psych: appropriate affect   Diagnostic Studies     Assessment and Plan        Arnoldo Lenis, M.D., F.A.C.C.

## 2021-10-16 DIAGNOSIS — Z20828 Contact with and (suspected) exposure to other viral communicable diseases: Secondary | ICD-10-CM | POA: Diagnosis not present

## 2021-11-02 DIAGNOSIS — E1122 Type 2 diabetes mellitus with diabetic chronic kidney disease: Secondary | ICD-10-CM | POA: Diagnosis not present

## 2021-11-02 DIAGNOSIS — I10 Essential (primary) hypertension: Secondary | ICD-10-CM | POA: Diagnosis not present

## 2021-11-02 DIAGNOSIS — Z6836 Body mass index (BMI) 36.0-36.9, adult: Secondary | ICD-10-CM | POA: Diagnosis not present

## 2021-11-02 DIAGNOSIS — M549 Dorsalgia, unspecified: Secondary | ICD-10-CM | POA: Diagnosis not present

## 2021-11-02 DIAGNOSIS — E1165 Type 2 diabetes mellitus with hyperglycemia: Secondary | ICD-10-CM | POA: Diagnosis not present

## 2021-11-02 DIAGNOSIS — Z299 Encounter for prophylactic measures, unspecified: Secondary | ICD-10-CM | POA: Diagnosis not present

## 2021-11-02 DIAGNOSIS — Z789 Other specified health status: Secondary | ICD-10-CM | POA: Diagnosis not present

## 2021-11-08 DIAGNOSIS — I10 Essential (primary) hypertension: Secondary | ICD-10-CM | POA: Diagnosis not present

## 2021-12-08 DIAGNOSIS — K219 Gastro-esophageal reflux disease without esophagitis: Secondary | ICD-10-CM | POA: Diagnosis not present

## 2021-12-08 DIAGNOSIS — I10 Essential (primary) hypertension: Secondary | ICD-10-CM | POA: Diagnosis not present

## 2021-12-08 DIAGNOSIS — E119 Type 2 diabetes mellitus without complications: Secondary | ICD-10-CM | POA: Diagnosis not present

## 2021-12-08 DIAGNOSIS — E7800 Pure hypercholesterolemia, unspecified: Secondary | ICD-10-CM | POA: Diagnosis not present

## 2021-12-18 DIAGNOSIS — E1122 Type 2 diabetes mellitus with diabetic chronic kidney disease: Secondary | ICD-10-CM | POA: Diagnosis not present

## 2021-12-18 DIAGNOSIS — M25512 Pain in left shoulder: Secondary | ICD-10-CM | POA: Diagnosis not present

## 2021-12-18 DIAGNOSIS — Z299 Encounter for prophylactic measures, unspecified: Secondary | ICD-10-CM | POA: Diagnosis not present

## 2021-12-18 DIAGNOSIS — Z6835 Body mass index (BMI) 35.0-35.9, adult: Secondary | ICD-10-CM | POA: Diagnosis not present

## 2021-12-18 DIAGNOSIS — E1165 Type 2 diabetes mellitus with hyperglycemia: Secondary | ICD-10-CM | POA: Diagnosis not present

## 2021-12-18 DIAGNOSIS — I10 Essential (primary) hypertension: Secondary | ICD-10-CM | POA: Diagnosis not present

## 2021-12-30 DIAGNOSIS — Z20822 Contact with and (suspected) exposure to covid-19: Secondary | ICD-10-CM | POA: Diagnosis not present

## 2022-01-07 DIAGNOSIS — I10 Essential (primary) hypertension: Secondary | ICD-10-CM | POA: Diagnosis not present

## 2022-01-22 DIAGNOSIS — Z20822 Contact with and (suspected) exposure to covid-19: Secondary | ICD-10-CM | POA: Diagnosis not present

## 2022-01-25 DIAGNOSIS — Z20822 Contact with and (suspected) exposure to covid-19: Secondary | ICD-10-CM | POA: Diagnosis not present

## 2022-01-28 DIAGNOSIS — Z20828 Contact with and (suspected) exposure to other viral communicable diseases: Secondary | ICD-10-CM | POA: Diagnosis not present

## 2022-01-28 DIAGNOSIS — Z1152 Encounter for screening for COVID-19: Secondary | ICD-10-CM | POA: Diagnosis not present

## 2022-02-03 DIAGNOSIS — Z299 Encounter for prophylactic measures, unspecified: Secondary | ICD-10-CM | POA: Diagnosis not present

## 2022-02-03 DIAGNOSIS — E1129 Type 2 diabetes mellitus with other diabetic kidney complication: Secondary | ICD-10-CM | POA: Diagnosis not present

## 2022-02-03 DIAGNOSIS — R809 Proteinuria, unspecified: Secondary | ICD-10-CM | POA: Diagnosis not present

## 2022-02-03 DIAGNOSIS — I10 Essential (primary) hypertension: Secondary | ICD-10-CM | POA: Diagnosis not present

## 2022-02-07 DIAGNOSIS — I10 Essential (primary) hypertension: Secondary | ICD-10-CM | POA: Diagnosis not present

## 2022-02-08 DIAGNOSIS — Z20822 Contact with and (suspected) exposure to covid-19: Secondary | ICD-10-CM | POA: Diagnosis not present

## 2022-02-09 DIAGNOSIS — Z20822 Contact with and (suspected) exposure to covid-19: Secondary | ICD-10-CM | POA: Diagnosis not present

## 2022-02-15 DIAGNOSIS — Z20822 Contact with and (suspected) exposure to covid-19: Secondary | ICD-10-CM | POA: Diagnosis not present

## 2022-03-09 DIAGNOSIS — Z299 Encounter for prophylactic measures, unspecified: Secondary | ICD-10-CM | POA: Diagnosis not present

## 2022-03-09 DIAGNOSIS — M1990 Unspecified osteoarthritis, unspecified site: Secondary | ICD-10-CM | POA: Diagnosis not present

## 2022-03-09 DIAGNOSIS — I10 Essential (primary) hypertension: Secondary | ICD-10-CM | POA: Diagnosis not present

## 2022-03-09 DIAGNOSIS — R42 Dizziness and giddiness: Secondary | ICD-10-CM | POA: Diagnosis not present

## 2022-03-09 DIAGNOSIS — E1122 Type 2 diabetes mellitus with diabetic chronic kidney disease: Secondary | ICD-10-CM | POA: Diagnosis not present

## 2022-03-09 DIAGNOSIS — Z789 Other specified health status: Secondary | ICD-10-CM | POA: Diagnosis not present

## 2022-03-16 DIAGNOSIS — Z96653 Presence of artificial knee joint, bilateral: Secondary | ICD-10-CM | POA: Diagnosis not present

## 2022-03-16 DIAGNOSIS — M7632 Iliotibial band syndrome, left leg: Secondary | ICD-10-CM | POA: Diagnosis not present

## 2022-03-23 DIAGNOSIS — E1165 Type 2 diabetes mellitus with hyperglycemia: Secondary | ICD-10-CM | POA: Diagnosis not present

## 2022-03-23 DIAGNOSIS — E1122 Type 2 diabetes mellitus with diabetic chronic kidney disease: Secondary | ICD-10-CM | POA: Diagnosis not present

## 2022-03-23 DIAGNOSIS — Z299 Encounter for prophylactic measures, unspecified: Secondary | ICD-10-CM | POA: Diagnosis not present

## 2022-03-23 DIAGNOSIS — Z6835 Body mass index (BMI) 35.0-35.9, adult: Secondary | ICD-10-CM | POA: Diagnosis not present

## 2022-03-23 DIAGNOSIS — I10 Essential (primary) hypertension: Secondary | ICD-10-CM | POA: Diagnosis not present

## 2022-03-23 DIAGNOSIS — N183 Chronic kidney disease, stage 3 unspecified: Secondary | ICD-10-CM | POA: Diagnosis not present

## 2022-03-23 DIAGNOSIS — M180 Bilateral primary osteoarthritis of first carpometacarpal joints: Secondary | ICD-10-CM | POA: Diagnosis not present

## 2022-03-23 DIAGNOSIS — M19012 Primary osteoarthritis, left shoulder: Secondary | ICD-10-CM | POA: Diagnosis not present

## 2022-03-24 ENCOUNTER — Other Ambulatory Visit: Payer: Self-pay | Admitting: Internal Medicine

## 2022-03-24 ENCOUNTER — Ambulatory Visit
Admission: RE | Admit: 2022-03-24 | Discharge: 2022-03-24 | Disposition: A | Discharge status: Home / Self Care | Payer: Medicare Other | Source: Ambulatory Visit | Attending: Internal Medicine | Admitting: Internal Medicine

## 2022-03-24 DIAGNOSIS — Z1231 Encounter for screening mammogram for malignant neoplasm of breast: Secondary | ICD-10-CM

## 2022-03-24 HISTORY — DX: Malignant neoplasm of unspecified site of unspecified female breast: C50.919

## 2022-03-26 DIAGNOSIS — R2689 Other abnormalities of gait and mobility: Secondary | ICD-10-CM | POA: Diagnosis not present

## 2022-03-26 DIAGNOSIS — M7632 Iliotibial band syndrome, left leg: Secondary | ICD-10-CM | POA: Diagnosis not present

## 2022-03-29 DIAGNOSIS — M7632 Iliotibial band syndrome, left leg: Secondary | ICD-10-CM | POA: Diagnosis not present

## 2022-03-29 DIAGNOSIS — R2689 Other abnormalities of gait and mobility: Secondary | ICD-10-CM | POA: Diagnosis not present

## 2022-03-30 DIAGNOSIS — E2839 Other primary ovarian failure: Secondary | ICD-10-CM | POA: Diagnosis not present

## 2022-04-01 DIAGNOSIS — M7632 Iliotibial band syndrome, left leg: Secondary | ICD-10-CM | POA: Diagnosis not present

## 2022-04-01 DIAGNOSIS — R2689 Other abnormalities of gait and mobility: Secondary | ICD-10-CM | POA: Diagnosis not present

## 2022-04-05 DIAGNOSIS — R2689 Other abnormalities of gait and mobility: Secondary | ICD-10-CM | POA: Diagnosis not present

## 2022-04-05 DIAGNOSIS — M7632 Iliotibial band syndrome, left leg: Secondary | ICD-10-CM | POA: Diagnosis not present

## 2022-04-07 DIAGNOSIS — M7632 Iliotibial band syndrome, left leg: Secondary | ICD-10-CM | POA: Diagnosis not present

## 2022-04-07 DIAGNOSIS — R2689 Other abnormalities of gait and mobility: Secondary | ICD-10-CM | POA: Diagnosis not present

## 2022-04-08 DIAGNOSIS — M79641 Pain in right hand: Secondary | ICD-10-CM | POA: Diagnosis not present

## 2022-04-08 DIAGNOSIS — I10 Essential (primary) hypertension: Secondary | ICD-10-CM | POA: Diagnosis not present

## 2022-04-08 DIAGNOSIS — M79642 Pain in left hand: Secondary | ICD-10-CM | POA: Diagnosis not present

## 2022-04-08 DIAGNOSIS — M180 Bilateral primary osteoarthritis of first carpometacarpal joints: Secondary | ICD-10-CM | POA: Diagnosis not present

## 2022-04-14 DIAGNOSIS — R2689 Other abnormalities of gait and mobility: Secondary | ICD-10-CM | POA: Diagnosis not present

## 2022-04-14 DIAGNOSIS — M7632 Iliotibial band syndrome, left leg: Secondary | ICD-10-CM | POA: Diagnosis not present

## 2022-04-16 DIAGNOSIS — M7632 Iliotibial band syndrome, left leg: Secondary | ICD-10-CM | POA: Diagnosis not present

## 2022-04-16 DIAGNOSIS — R2689 Other abnormalities of gait and mobility: Secondary | ICD-10-CM | POA: Diagnosis not present

## 2022-04-21 DIAGNOSIS — M7632 Iliotibial band syndrome, left leg: Secondary | ICD-10-CM | POA: Diagnosis not present

## 2022-04-21 DIAGNOSIS — R2689 Other abnormalities of gait and mobility: Secondary | ICD-10-CM | POA: Diagnosis not present

## 2022-04-23 DIAGNOSIS — R2689 Other abnormalities of gait and mobility: Secondary | ICD-10-CM | POA: Diagnosis not present

## 2022-04-23 DIAGNOSIS — M7632 Iliotibial band syndrome, left leg: Secondary | ICD-10-CM | POA: Diagnosis not present

## 2022-05-10 DIAGNOSIS — I10 Essential (primary) hypertension: Secondary | ICD-10-CM | POA: Diagnosis not present

## 2022-06-09 DIAGNOSIS — I10 Essential (primary) hypertension: Secondary | ICD-10-CM | POA: Diagnosis not present

## 2022-06-29 DIAGNOSIS — H35033 Hypertensive retinopathy, bilateral: Secondary | ICD-10-CM | POA: Diagnosis not present

## 2022-07-03 DIAGNOSIS — Z23 Encounter for immunization: Secondary | ICD-10-CM | POA: Diagnosis not present

## 2022-07-09 DIAGNOSIS — I10 Essential (primary) hypertension: Secondary | ICD-10-CM | POA: Diagnosis not present

## 2022-07-27 DIAGNOSIS — M19011 Primary osteoarthritis, right shoulder: Secondary | ICD-10-CM | POA: Diagnosis not present

## 2022-07-27 DIAGNOSIS — M19012 Primary osteoarthritis, left shoulder: Secondary | ICD-10-CM | POA: Diagnosis not present

## 2022-08-09 DIAGNOSIS — I10 Essential (primary) hypertension: Secondary | ICD-10-CM | POA: Diagnosis not present

## 2022-08-09 DIAGNOSIS — R42 Dizziness and giddiness: Secondary | ICD-10-CM | POA: Diagnosis not present

## 2022-08-09 DIAGNOSIS — M25511 Pain in right shoulder: Secondary | ICD-10-CM | POA: Diagnosis not present

## 2022-08-09 DIAGNOSIS — E1122 Type 2 diabetes mellitus with diabetic chronic kidney disease: Secondary | ICD-10-CM | POA: Diagnosis not present

## 2022-08-09 DIAGNOSIS — Z299 Encounter for prophylactic measures, unspecified: Secondary | ICD-10-CM | POA: Diagnosis not present

## 2022-09-08 DIAGNOSIS — I10 Essential (primary) hypertension: Secondary | ICD-10-CM | POA: Diagnosis not present

## 2022-09-20 DIAGNOSIS — R5383 Other fatigue: Secondary | ICD-10-CM | POA: Diagnosis not present

## 2022-09-20 DIAGNOSIS — Z0000 Encounter for general adult medical examination without abnormal findings: Secondary | ICD-10-CM | POA: Diagnosis not present

## 2022-09-20 DIAGNOSIS — Z7189 Other specified counseling: Secondary | ICD-10-CM | POA: Diagnosis not present

## 2022-09-20 DIAGNOSIS — E559 Vitamin D deficiency, unspecified: Secondary | ICD-10-CM | POA: Diagnosis not present

## 2022-09-20 DIAGNOSIS — Z1339 Encounter for screening examination for other mental health and behavioral disorders: Secondary | ICD-10-CM | POA: Diagnosis not present

## 2022-09-20 DIAGNOSIS — Z299 Encounter for prophylactic measures, unspecified: Secondary | ICD-10-CM | POA: Diagnosis not present

## 2022-09-20 DIAGNOSIS — Z1331 Encounter for screening for depression: Secondary | ICD-10-CM | POA: Diagnosis not present

## 2022-09-20 DIAGNOSIS — E7800 Pure hypercholesterolemia, unspecified: Secondary | ICD-10-CM | POA: Diagnosis not present

## 2022-09-20 DIAGNOSIS — I10 Essential (primary) hypertension: Secondary | ICD-10-CM | POA: Diagnosis not present

## 2022-09-20 DIAGNOSIS — Z79899 Other long term (current) drug therapy: Secondary | ICD-10-CM | POA: Diagnosis not present

## 2022-09-20 DIAGNOSIS — Z6834 Body mass index (BMI) 34.0-34.9, adult: Secondary | ICD-10-CM | POA: Diagnosis not present

## 2022-10-08 DIAGNOSIS — I10 Essential (primary) hypertension: Secondary | ICD-10-CM | POA: Diagnosis not present

## 2022-11-08 DIAGNOSIS — I10 Essential (primary) hypertension: Secondary | ICD-10-CM | POA: Diagnosis not present

## 2022-11-09 DIAGNOSIS — M180 Bilateral primary osteoarthritis of first carpometacarpal joints: Secondary | ICD-10-CM | POA: Diagnosis not present

## 2022-12-01 DIAGNOSIS — Z03818 Encounter for observation for suspected exposure to other biological agents ruled out: Secondary | ICD-10-CM | POA: Diagnosis not present

## 2022-12-01 DIAGNOSIS — R531 Weakness: Secondary | ICD-10-CM | POA: Diagnosis not present

## 2022-12-01 DIAGNOSIS — R0602 Shortness of breath: Secondary | ICD-10-CM | POA: Diagnosis not present

## 2022-12-01 DIAGNOSIS — R051 Acute cough: Secondary | ICD-10-CM | POA: Diagnosis not present

## 2022-12-08 DIAGNOSIS — I10 Essential (primary) hypertension: Secondary | ICD-10-CM | POA: Diagnosis not present

## 2022-12-13 DIAGNOSIS — Z299 Encounter for prophylactic measures, unspecified: Secondary | ICD-10-CM | POA: Diagnosis not present

## 2022-12-13 DIAGNOSIS — I10 Essential (primary) hypertension: Secondary | ICD-10-CM | POA: Diagnosis not present

## 2022-12-13 DIAGNOSIS — Z6834 Body mass index (BMI) 34.0-34.9, adult: Secondary | ICD-10-CM | POA: Diagnosis not present

## 2022-12-13 DIAGNOSIS — E1122 Type 2 diabetes mellitus with diabetic chronic kidney disease: Secondary | ICD-10-CM | POA: Diagnosis not present

## 2022-12-13 DIAGNOSIS — N183 Chronic kidney disease, stage 3 unspecified: Secondary | ICD-10-CM | POA: Diagnosis not present

## 2022-12-13 DIAGNOSIS — E1165 Type 2 diabetes mellitus with hyperglycemia: Secondary | ICD-10-CM | POA: Diagnosis not present

## 2023-01-08 DIAGNOSIS — I10 Essential (primary) hypertension: Secondary | ICD-10-CM | POA: Diagnosis not present

## 2023-01-24 DIAGNOSIS — Z299 Encounter for prophylactic measures, unspecified: Secondary | ICD-10-CM | POA: Diagnosis not present

## 2023-01-24 DIAGNOSIS — R609 Edema, unspecified: Secondary | ICD-10-CM | POA: Diagnosis not present

## 2023-01-24 DIAGNOSIS — N183 Chronic kidney disease, stage 3 unspecified: Secondary | ICD-10-CM | POA: Diagnosis not present

## 2023-01-24 DIAGNOSIS — E1165 Type 2 diabetes mellitus with hyperglycemia: Secondary | ICD-10-CM | POA: Diagnosis not present

## 2023-01-24 DIAGNOSIS — I10 Essential (primary) hypertension: Secondary | ICD-10-CM | POA: Diagnosis not present

## 2023-01-24 DIAGNOSIS — E1122 Type 2 diabetes mellitus with diabetic chronic kidney disease: Secondary | ICD-10-CM | POA: Diagnosis not present

## 2023-03-11 DIAGNOSIS — I10 Essential (primary) hypertension: Secondary | ICD-10-CM | POA: Diagnosis not present

## 2023-03-22 DIAGNOSIS — E1165 Type 2 diabetes mellitus with hyperglycemia: Secondary | ICD-10-CM | POA: Diagnosis not present

## 2023-03-22 DIAGNOSIS — R053 Chronic cough: Secondary | ICD-10-CM | POA: Diagnosis not present

## 2023-03-22 DIAGNOSIS — N183 Chronic kidney disease, stage 3 unspecified: Secondary | ICD-10-CM | POA: Diagnosis not present

## 2023-03-22 DIAGNOSIS — E1122 Type 2 diabetes mellitus with diabetic chronic kidney disease: Secondary | ICD-10-CM | POA: Diagnosis not present

## 2023-03-22 DIAGNOSIS — Z299 Encounter for prophylactic measures, unspecified: Secondary | ICD-10-CM | POA: Diagnosis not present

## 2023-03-22 DIAGNOSIS — I10 Essential (primary) hypertension: Secondary | ICD-10-CM | POA: Diagnosis not present

## 2023-03-22 DIAGNOSIS — R059 Cough, unspecified: Secondary | ICD-10-CM | POA: Diagnosis not present

## 2023-03-25 DIAGNOSIS — R918 Other nonspecific abnormal finding of lung field: Secondary | ICD-10-CM | POA: Diagnosis not present

## 2023-03-28 DIAGNOSIS — N183 Chronic kidney disease, stage 3 unspecified: Secondary | ICD-10-CM | POA: Diagnosis not present

## 2023-03-28 DIAGNOSIS — R918 Other nonspecific abnormal finding of lung field: Secondary | ICD-10-CM | POA: Diagnosis not present

## 2023-03-28 DIAGNOSIS — Z299 Encounter for prophylactic measures, unspecified: Secondary | ICD-10-CM | POA: Diagnosis not present

## 2023-03-28 DIAGNOSIS — I10 Essential (primary) hypertension: Secondary | ICD-10-CM | POA: Diagnosis not present

## 2023-03-28 DIAGNOSIS — C3491 Malignant neoplasm of unspecified part of right bronchus or lung: Secondary | ICD-10-CM | POA: Diagnosis not present

## 2023-04-10 DIAGNOSIS — I10 Essential (primary) hypertension: Secondary | ICD-10-CM | POA: Diagnosis not present

## 2023-04-29 ENCOUNTER — Encounter: Payer: Self-pay | Admitting: Emergency Medicine

## 2023-04-29 ENCOUNTER — Ambulatory Visit (INDEPENDENT_AMBULATORY_CARE_PROVIDER_SITE_OTHER): Payer: Medicare Other | Admitting: Emergency Medicine

## 2023-04-29 VITALS — BP 136/82 | HR 80 | Temp 98.4°F | Ht 63.0 in | Wt 197.4 lb

## 2023-04-29 DIAGNOSIS — R918 Other nonspecific abnormal finding of lung field: Secondary | ICD-10-CM

## 2023-04-29 NOTE — Assessment & Plan Note (Signed)
Isolated right upper lobe mass found spuriously when evaluated for chronic cough.  She is a low risk patient but she did have radiation to that area when she had breast cancer in 2005.  No PPD, TB risk.  No other exposures.  Suspect this is primary lung cancer.  Explained this to her and I recommended robotic assisted navigational bronchoscopy.  We will get this scheduled.  Will also perform a PET scan, PFT.  She may be a surgical candidate.

## 2023-04-29 NOTE — H&P (View-Only) (Signed)
Subjective:    Patient ID: Carol Keith, female    DOB: [birth year], 72 y.o.   MRN: [id number]  HPI 72 yo woman, never smoker, with hx R breast CA (2005, lumpectomy + XRT, lumpectomy), DM, HTN, CKD.  She is here to evaluate an abnormal CT chest CXR was done 03/22/23 to evaulate chronic cough. This showed a RUL opacity that prompted CT as below.  She reports that she still has some mild cough. No dyspnea.   CT chest 03/25/23 reviewed by me shows an isolated 2.3 x 3.8 cm RUL mass. No mediastinal adenopathy.    Review of Systems As per HPI  Past Medical History:  Diagnosis Date   Allergic rhinitis    Arthritis    kneees, back    Breast cancer ([facility])    Bursitis of right hip    Cancer ([facility])    breast cancer, right   Cerumen impaction    Chronic kidney disease    appt. schedule for Dr. 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   Dizziness    Fatigue    Frequent urination    Gout    Heart murmur    Echo11/21/16 ([facility]): LVEF 50-55%, reduced LV diastolic function, mild TR    Hypercholesteremia    Hypertension    Hypokalemia    Left foot pain    Left shoulder pain    Lumbago    Oral candida    Ovarian failure    Plantar fascial fibromatosis    PONV (postoperative nausea and vomiting)    Proteinuria    Right knee pain      Family History  Problem Relation Age of Onset   Breast cancer Sister        age 33   BRCA 1/2 Daughter    Breast cancer Daughter      Social History   Socioeconomic History   Marital status: Married    Spouse name: Not on file   Number of children: Not on file   Years of education: Not on file   Highest education level: Not on file  Occupational History    Comment: [employer]  Tobacco Use   Smoking status: Never   Smokeless tobacco: Never  Vaping Use   Vaping status: Never Used  Substance and Sexual Activity   Alcohol use: No   Drug use: No   Sexual  activity: Not on file  Other Topics Concern   Not on file  Social History Narrative   Not on file   Social Determinants of Health   Financial Resource Strain: Not on file  Food Insecurity: Not on file  Transportation Needs: Not on file  Physical Activity: Not on file  Stress: Not on file  Social Connections: Not on file  Intimate Partner Violence: Not At Risk (05/04/2021)   Received from [facility], [facility]   Humiliation, Afraid, Rape, and Kick questionnaire    Fear of Current or Ex-Partner: No    Emotionally Abused: No    Physically Abused: No    Sexually Abused: No    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      amLODipine (NORVASC) 5 MG tablet Take 5 mg by mouth daily.     aspirin EC 81 MG tablet Take 81 mg by mouth daily.     bumetanide (BUMEX) 1 MG tablet Take 1 mg by mouth daily.     Camphor-Menthol-Methyl Sal (SALONPAS) 3.10-16-08 % PTCH Place 1 patch onto the skin daily as needed (pain).     cetirizine (ZYRTEC) 10 MG tablet Take 10 mg by mouth daily.     diclofenac sodium (VOLTAREN) 1 % GEL Apply 2 g topically 2 (two) times daily as needed (pain).     doxazosin (CARDURA XL) 4 MG 24 hr tablet Take 4 mg by mouth at bedtime.     Exenatide ER (BYDUREON BCISE) 2 MG/0.85ML AUIJ Inject 2 mg into the skin every Saturday.      glimepiride (AMARYL) 4 MG tablet Take 4 mg by mouth daily with breakfast.      glucose blood test strip 1 each by Other route as needed for other. Use as instructed     Insulin Pen Needle (NOVOFINE) 32G X 6 MM MISC by Does not apply route.     losartan (COZAAR) 100 MG tablet Take 100 mg by mouth daily.     metoprolol  succinate (TOPROL-XL) 100 MG 24 hr tablet Take 100 mg by mouth daily with breakfast. Take with or immediately following a meal.      pantoprazole (PROTONIX) 40 MG tablet Take 40 mg by mouth daily before breakfast.     Polyethyl Glycol-Propyl Glycol (LUBRICANT EYE DROPS) 0.4-0.3 % SOLN Place 1 drop into both eyes 3 (three) times daily as needed (dry/irritated eyes.).     polyethylene glycol (MIRALAX / GLYCOLAX) 17 g packet Take 17 g by mouth daily as needed (constipation.).     potassium chloride (K-DUR) 10 MEQ tablet Take 20 mEq by mouth daily.      rosuvastatin (CRESTOR) 10 MG tablet Take 10 mg by mouth at bedtime.      tiZANidine (ZANAFLEX) 4 MG tablet Take 4 mg by mouth every 8 (eight) hours as needed for muscle spasms.      TOUJEO SOLOSTAR 300 UNIT/ML SOPN Inject 45 Units into the skin at bedtime.     fluticasone (FLONASE) 50 MCG/ACT nasal spray Place 1 spray into both nostrils daily as needed (allergies.).  (Patient not taking: Reported on 04/29/2023)     No facility-administered medications prior to visit.         Objective:   Physical Exam  Vitals:   04/29/23 1459  BP: 136/82  Pulse: 80  Temp: 98.4 F (36.9 C)  TempSrc: Oral  SpO2: 99%  Weight: 197 lb 6.4 oz (89.5 kg)  Height: 5\' 3"  (1.6 m)   Gen: Pleasant, well-nourished, in no distress,  normal affect  ENT: No lesions,  mouth clear,  oropharynx clear, no postnasal drip  Neck: No JVD, no stridor  Lungs: No use of accessory muscles, no crackles or wheezing on normal respiration, no wheeze on forced expiration  Cardiovascular: RRR, heart sounds normal, no murmur or gallops, no peripheral edema  Musculoskeletal: No deformities, no cyanosis or clubbing  Neuro: alert, awake, non focal  Skin: Warm, no lesions or rash      Assessment & Plan:  Lung mass Isolated right upper lobe mass found spuriously when evaluated for chronic cough.  She  is a low risk patient but she did have radiation to that area when she had  breast cancer in 2005.  No PPD, TB risk.  No other exposures.  Suspect this is primary lung cancer.  Explained this to her and I recommended robotic assisted navigational bronchoscopy.  We will get this scheduled.  Will also perform a PET scan, PFT.  She may be a surgical candidate.   Levy Pupa, MD, PhD 04/29/2023, 3:42 PM [facility] [phone] or if no answer before 7:00PM call [phone] For any issues after 7:00PM please call eLink [phone]

## 2023-04-29 NOTE — Progress Notes (Signed)
Subjective:    Patient ID: Carol Keith, female    DOB: 09/19/1951, 72 y.o.   MRN: [id number]  HPI 72 yo woman, never smoker, with hx R breast CA (2005, lumpectomy + XRT, lumpectomy), DM, HTN, CKD.  She is here to evaluate an abnormal CT chest CXR was done 03/22/23 to evaulate chronic cough. This showed a RUL opacity that prompted CT as below.  She reports that she still has some mild cough. No dyspnea.   CT chest 03/25/23 reviewed by me shows an isolated 2.3 x 3.8 cm RUL mass. No mediastinal adenopathy.    Review of Systems As per HPI  Past Medical History:  Diagnosis Date   Allergic rhinitis    Arthritis    kneees, back    Breast cancer ([facility])    Bursitis of right hip    Cancer ([facility])    breast cancer, right   Cerumen impaction    Chronic kidney disease    appt. schedule for Dr. 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   Dizziness    Fatigue    Frequent urination    Gout    Heart murmur    Echo11/21/16 ([facility]): LVEF 50-55%, reduced LV diastolic function, mild TR    Hypercholesteremia    Hypertension    Hypokalemia    Left foot pain    Left shoulder pain    Lumbago    Oral candida    Ovarian failure    Plantar fascial fibromatosis    PONV (postoperative nausea and vomiting)    Proteinuria    Right knee pain      Family History  Problem Relation Age of Onset   Breast cancer Sister        age 31   BRCA 1/2 Daughter    Breast cancer Daughter      Social History   Socioeconomic History   Marital status: Married    Spouse name: Not on file   Number of children: Not on file   Years of education: Not on file   Highest education level: Not on file  Occupational History    Comment: [employer]  Tobacco Use   Smoking status: Never   Smokeless tobacco: Never  Vaping Use   Vaping status: Never Used  Substance and Sexual Activity   Alcohol use: No   Drug use: No   Sexual  activity: Not on file  Other Topics Concern   Not on file  Social History Narrative   Not on file   Social Determinants of Health   Financial Resource Strain: Not on file  Food Insecurity: Not on file  Transportation Needs: Not on file  Physical Activity: Not on file  Stress: Not on file  Social Connections: Not on file  Intimate Partner Violence: Not At Risk (05/04/2021)   Received from [facility], [facility]   Humiliation, Afraid, Rape, and Kick questionnaire    Fear of Current or Ex-Partner: No    Emotionally Abused: No    Physically Abused: No    Sexually Abused: No    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      amLODipine (NORVASC) 5 MG tablet Take 5 mg by mouth daily.     aspirin EC 81 MG tablet Take 81 mg by mouth daily.     bumetanide (BUMEX) 1 MG tablet Take 1 mg by mouth daily.     Camphor-Menthol-Methyl Sal (SALONPAS) 3.10-16-08 % PTCH Place 1 patch onto the skin daily as needed (pain).     cetirizine (ZYRTEC) 10 MG tablet Take 10 mg by mouth daily.     diclofenac sodium (VOLTAREN) 1 % GEL Apply 2 g topically 2 (two) times daily as needed (pain).     doxazosin (CARDURA XL) 4 MG 24 hr tablet Take 4 mg by mouth at bedtime.     Exenatide ER (BYDUREON BCISE) 2 MG/0.85ML AUIJ Inject 2 mg into the skin every Saturday.      glimepiride (AMARYL) 4 MG tablet Take 4 mg by mouth daily with breakfast.      glucose blood test strip 1 each by Other route as needed for other. Use as instructed     Insulin Pen Needle (NOVOFINE) 32G X 6 MM MISC by Does not apply route.     losartan (COZAAR) 100 MG tablet Take 100 mg by mouth daily.     metoprolol  succinate (TOPROL-XL) 100 MG 24 hr tablet Take 100 mg by mouth daily with breakfast. Take with or immediately following a meal.      pantoprazole (PROTONIX) 40 MG tablet Take 40 mg by mouth daily before breakfast.     Polyethyl Glycol-Propyl Glycol (LUBRICANT EYE DROPS) 0.4-0.3 % SOLN Place 1 drop into both eyes 3 (three) times daily as needed (dry/irritated eyes.).     polyethylene glycol (MIRALAX / GLYCOLAX) 17 g packet Take 17 g by mouth daily as needed (constipation.).     potassium chloride (K-DUR) 10 MEQ tablet Take 20 mEq by mouth daily.      rosuvastatin (CRESTOR) 10 MG tablet Take 10 mg by mouth at bedtime.      tiZANidine (ZANAFLEX) 4 MG tablet Take 4 mg by mouth every 8 (eight) hours as needed for muscle spasms.      TOUJEO SOLOSTAR 300 UNIT/ML SOPN Inject 45 Units into the skin at bedtime.     fluticasone (FLONASE) 50 MCG/ACT nasal spray Place 1 spray into both nostrils daily as needed (allergies.).  (Patient not taking: Reported on 04/29/2023)     No facility-administered medications prior to visit.         Objective:   Physical Exam  Vitals:   04/29/23 1459  BP: 136/82  Pulse: 80  Temp: 98.4 F (36.9 C)  TempSrc: Oral  SpO2: 99%  Weight: 197 lb 6.4 oz (89.5 kg)  Height: 5\' 3"  (1.6 m)   Gen: Pleasant, well-nourished, in no distress,  normal affect  ENT: No lesions,  mouth clear,  oropharynx clear, no postnasal drip  Neck: No JVD, no stridor  Lungs: No use of accessory muscles, no crackles or wheezing on normal respiration, no wheeze on forced expiration  Cardiovascular: RRR, heart sounds normal, no murmur or gallops, no peripheral edema  Musculoskeletal: No deformities, no cyanosis or clubbing  Neuro: alert, awake, non focal  Skin: Warm, no lesions or rash      Assessment & Plan:  Lung mass Isolated right upper lobe mass found spuriously when evaluated for chronic cough.  She  is a low risk patient but she did have radiation to that area when she had  breast cancer in 2005.  No PPD, TB risk.  No other exposures.  Suspect this is primary lung cancer.  Explained this to her and I recommended robotic assisted navigational bronchoscopy.  We will get this scheduled.  Will also perform a PET scan, PFT.  She may be a surgical candidate.   Levy Pupa, MD, PhD 04/29/2023, 3:42 PM [facility] [phone] or if no answer before 7:00PM call [phone] For any issues after 7:00PM please call eLink [phone]

## 2023-04-29 NOTE — Addendum Note (Signed)
Addended by: Dorisann Frames R on: 04/29/2023 03:59 PM   Modules accepted: Orders

## 2023-04-29 NOTE — Patient Instructions (Addendum)
We reviewed your CT scan of the chest today. We will arrange for bronchoscopy to evaluate a right upper lobe pulmonary nodule.  This will be done as an outpatient under general anesthesia at [facility] endoscopy.  Will try to get this scheduled for 05/09/2023.  You will need a designated driver and someone to watch you at home on that day after the procedure. We will arrange for a PET scan at [facility] We will arrange for pulmonary function testing Follow Dr. Delton Coombes next available opening.

## 2023-05-05 ENCOUNTER — Other Ambulatory Visit: Payer: Self-pay

## 2023-05-05 ENCOUNTER — Encounter (HOSPITAL_COMMUNITY): Payer: Self-pay | Admitting: Emergency Medicine

## 2023-05-05 ENCOUNTER — Ambulatory Visit (HOSPITAL_COMMUNITY)
Admission: RE | Admit: 2023-05-05 | Discharge: 2023-05-05 | Disposition: A | Discharge status: Home / Self Care | Payer: Medicare Other | Source: Ambulatory Visit | Attending: Emergency Medicine | Admitting: Emergency Medicine

## 2023-05-05 DIAGNOSIS — R918 Other nonspecific abnormal finding of lung field: Secondary | ICD-10-CM | POA: Diagnosis not present

## 2023-05-05 MED ORDER — FLUDEOXYGLUCOSE F - 18 (FDG) INJECTION
9.7300 | Freq: Once | INTRAVENOUS | Status: AC | PRN
  Administered 2023-05-05: 9.73 via INTRAVENOUS

## 2023-05-05 NOTE — Progress Notes (Signed)
SDW CALL  Patient was given pre-op instructions over the phone. The opportunity was given for the patient to ask questions. No further questions asked. Patient verbalized understanding of instructions given.   PCP - Ashish Shah,MD Cardiologist -   PPM/ICD - denies Device Orders -  Rep Notified -   Chest x-ray - 03/22/23 EKG - DOS Stress Test -  ECHO - 09/21/21 Cardiac Cath - denies  Sleep Study - NO CPAP -   Fasting Blood Sugar - 80-120 Checks Blood Sugar 2 times a day  Blood Thinner Instructions:na Aspirin Instructions: Pt states she ran out of her aspirin and hasn't taken it in more than a week.   ERAS Protcol - no PRE-SURGERY Ensure or G2-   COVID TEST- NO   Anesthesia review: no  Patient denies shortness of breath, fever, cough and chest pain over the phone call  Surgical Instructions   Your procedure is scheduled on July 29  Report to [facility] Main Entrance "A" at 1015 A.M., then check in with the Admitting office.  Call this number if you have problems the morning of surgery:  [phone]   Remember:  Do not eat or drink anything after midnight the night before your surgery   Take these medicines the morning of surgery with A SIP OF WATER:  Allopurinol,Norvasc,Zyrtec,Toprol XL,Protonix.  PRN- Antivert,Lubricant eye gtts,Zanaflex.  As of today, STOP taking any Aspirin (unless otherwise instructed by your surgeon) Aleve, Naproxen, Ibuprofen, Motrin, Advil, Goody's, BC's, all herbal medications, fish oil, and all vitamins.  WHAT DO I DO ABOUT MY DIABETES MEDICATION?  Do not take oral diabetes medicines (pills) the morning of surgery. Do not take Glimepiride(Amaryl) the morning of surgery.  THE NIGHT BEFORE SURGERY, take 22 units(50%normal dose) of Toujeo insulin.      HOLD EXENATIDE ER (BYDUREON) INJECTION 7 DAYS PRIOR TO SURGERY. LAST DOSE WAS 7/19 per patient.   Check your blood sugar the morning of your surgery when you wake up and every 2 hours  until you get to the Short Stay unit.  If your blood sugar is less than 70 mg/dL, you will need to treat for low blood sugar: Do not take insulin. Treat a low blood sugar (less than 70 mg/dL) with  cup of clear juice (cranberry or apple), 4 glucose tablets, OR glucose gel. Recheck blood sugar in 15 minutes after treatment (to make sure it is greater than 70 mg/dL). If your blood sugar is not greater than 70 mg/dL on recheck, call [phone] for further instructions. Report your blood sugar to the short stay nurse when you get to Short Stay.  [name] is not responsible for any belongings or valuables. .   Do NOT Smoke (Tobacco/Vaping)  24 hours prior to your procedure  If you use a CPAP at night, you may bring your mask for your overnight stay.   Contacts, glasses, hearing aids, dentures or partials may not be worn into surgery, please bring cases for these belongings   Patients discharged the day of surgery will not be allowed to drive home, and someone needs to stay with them for 24 hours.   Special instructions:    Oral Hygiene is also important to reduce your risk of infection.  Remember - BRUSH YOUR TEETH THE MORNING OF SURGERY WITH YOUR REGULAR TOOTHPASTE   Day of Surgery:  Take a shower the day of or night before with antibacterial soap. Wear Clean/Comfortable clothing the morning of surgery Do not apply any deodorants/lotions.  Do not wear jewelry or makeup Do not wear lotions, powders, perfumes/colognes, or deodorant. Do not shave 48 hours prior to surgery.  Men may shave face and neck. Do not bring valuables to the hospital. Do not wear nail polish, gel polish, artificial nails, or any other type of covering on natural nails (fingers and toes) If you have artificial nails or gel coating that need to be removed by a nail salon, please have this removed prior to surgery. Artificial nails or gel coating may interfere with anesthesia's ability to adequately monitor  your vital signs. Remember to brush your teeth WITH YOUR REGULAR TOOTHPASTE.

## 2023-05-06 NOTE — Anesthesia Preprocedure Evaluation (Signed)
Anesthesia Evaluation  Patient identified by MRN, date of birth, ID band Patient awake    Reviewed: Allergy & Precautions, H&P , NPO status , Patient's Chart, lab work & pertinent test results  History of Anesthesia Complications (+) PONV and history of anesthetic complications  Airway Mallampati: II   Neck ROM: full    Dental   Pulmonary  RUL mass   breath sounds clear to auscultation       Cardiovascular hypertension,  Rhythm:regular Rate:Normal     Neuro/Psych    GI/Hepatic   Endo/Other  diabetes, Type 2    Renal/GU Renal disease     Musculoskeletal  (+) Arthritis ,    Abdominal   Peds  Hematology   Anesthesia Other Findings   Reproductive/Obstetrics H/o breast CA                             Anesthesia Physical Anesthesia Plan  ASA: 3  Anesthesia Plan: General   Post-op Pain Management:    Induction: Intravenous  PONV Risk Score and Plan: 4 or greater and Ondansetron, Dexamethasone and Treatment may vary due to age or medical condition  Airway Management Planned: Oral ETT  Additional Equipment:   Intra-op Plan:   Post-operative Plan: Extubation in OR  Informed Consent: I have reviewed the patients History and Physical, chart, labs and discussed the procedure including the risks, benefits and alternatives for the proposed anesthesia with the patient or authorized representative who has indicated his/her understanding and acceptance.     Dental advisory given  Plan Discussed with: CRNA, Anesthesiologist and Surgeon  Anesthesia Plan Comments: (PAT note written 05/06/2023 by Shonna Chock, PA-C.  )       Anesthesia Quick Evaluation

## 2023-05-06 NOTE — Progress Notes (Signed)
Anesthesia Chart Review: Carol Keith  Case: [id number] Date/Time: 05/09/23 1245   Procedure: ROBOTIC ASSISTED NAVIGATIONAL BRONCHOSCOPY   Anesthesia type: General   Pre-op diagnosis: RIGHT UPPER LOBE MASS   [facility] / [facility]   Surgeons: Leslye Peer, MD       DISCUSSION: Patient is a 72 year old female scheduled for the above procedure. RUL lung lesion found during evaluation for cough. Prior right chest radiation for breast cancer ~ 2005. Non-smoker.  History includes never smoker, postoperative N/V, HTN, hypercholesterolemia, murmur (mild MR, trace TR 2022), CKD, DM2, right breast cancer (s/p surgery, radiation).   Low risk stress test in 09/2021. Echo then showed Normal LVF, EF 68%, mild MR, trace TR.   She ran out of ASA, so is not currently taking. Reported last exenatide injection was on 04/29/23 and instructed per PAT RN need to hold for 7 days prior to surgery.   She is for updated labs and EKG as indicated on arrival. Creatinine 1.29 on 03/25/23 (Care Everywhere).     VS:  BP Readings from Last 3 Encounters:  04/29/23 136/82  08/20/19 (!) 176/88  08/06/19 (!) 145/69   Pulse Readings from Last 3 Encounters:  04/29/23 80  08/20/19 72  08/06/19 75     PROVIDERS: Kirstie Peri, MD is PCP  Salomon Mast, MD is nephrologist    LABS: For day of surgery as indicated.   IMAGES: PET Scan 05/05/23: In process.  CT Chest 03/25/23 [facility]): Apical segment right upper lobe mass, highly worrisome for primary  bronchogenic carcinoma (T2a N0 M0 or stage IB disease).    EKG: Last EKG known is > 1 year ago. By narrative, prior EKG on 09/11/21 showed NSR.   CV: Nuclear stress test 09/22/21 [facility]): 1. No evidence reversible ischemia. Potential small scar at the  lateral apex.  2. Normal left ventricular wall motion.  3. Left ventricular ejection fraction greater than 65%  4. Non invasive risk stratification: Low   Echo 09/21/21  (Ordered by Dr. Sherryll Burger): Conclusion: Left ventricular cavity is normal in size.  Diastolic filling with impaired relaxation pattern.  Normal systolic global function and left ventricle.  Calculated EF 68.26%.  Left atrial cavity is slightly dilated.  Mitral valve structurally normal.  Mild mitral regurgitation.  Trace tricuspid regurgitation.    Past Medical History:  Diagnosis Date   Allergic rhinitis    Arthritis    kneees, back    Breast cancer (HCC)    Bursitis of right hip    Cancer (HCC)    breast cancer, right   Cerumen impaction    Chronic kidney disease    appt. schedule for Dr. 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   Dizziness    Fatigue    Frequent urination    Gout    Heart murmur    Echo11/21/16 ([facility]): LVEF 50-55%, reduced LV diastolic function, mild TR    Hypercholesteremia    Hypertension    Hypokalemia    Left foot pain    Left shoulder pain    Lumbago    Oral candida    Ovarian failure    Plantar fascial fibromatosis    PONV (postoperative nausea and vomiting)    Proteinuria    Right knee pain     Past Surgical History:  Procedure Laterality Date   ABDOMINAL HYSTERECTOMY  complete   APPENDECTOMY     BILIARY BRUSHING  02/07/2019   Procedure: BILIARY BRUSHING;  Surgeon: Meridee Score Netty Starring., MD;  Location: [facility] ENDOSCOPY;  Service: Gastroenterology;;   BILIARY BRUSHING  07/04/2019   Procedure: BILIARY BRUSHING;  Surgeon: Lemar Lofty., MD;  Location: [facility] ENDOSCOPY;  Service: Gastroenterology;;   BILIARY DILATION  02/07/2019   Procedure: BILIARY DILATION;  Surgeon: Lemar Lofty., MD;  Location: [facility] ENDOSCOPY;  Service: Gastroenterology;;   BILIARY DILATION  07/04/2019   Procedure: BILIARY DILATION;  Surgeon: Lemar Lofty., MD;  Location: [facility] ENDOSCOPY;  Service: Gastroenterology;;   BIOPSY  02/07/2019   Procedure: BIOPSY;  Surgeon: Lemar Lofty., MD;  Location: [facility] ENDOSCOPY;  Service: Gastroenterology;;   BIOPSY  07/04/2019   Procedure: BIOPSY;  Surgeon: Lemar Lofty., MD;  Location: [facility] ENDOSCOPY;  Service: Gastroenterology;;   BREAST LUMPECTOMY Right 2015   BREAST SURGERY Right    for Breast Ca, tx with surg. & chemo & radiation     CATARACT EXTRACTION W/PHACO Left 08/06/2019   Procedure: CATARACT EXTRACTION PHACO AND INTRAOCULAR LENS PLACEMENT LEFT EYE;  Surgeon: Fabio Pierce, MD;  Location: [facility] ORS;  Service: Ophthalmology;  Laterality: Left;  CDE: 9.01   CATARACT EXTRACTION W/PHACO Right 08/20/2019   Procedure: CATARACT EXTRACTION PHACO AND INTRAOCULAR LENS PLACEMENT RIGHT EYE CDE=5.88;  Surgeon: Fabio Pierce, MD;  Location: [facility] ORS;  Service: Ophthalmology;  Laterality: Right;  right   CHOLECYSTECTOMY     COLONOSCOPY     ENDOSCOPIC RETROGRADE CHOLANGIOPANCREATOGRAPHY (ERCP) WITH PROPOFOL N/A 07/04/2019   Procedure: ENDOSCOPIC RETROGRADE CHOLANGIOPANCREATOGRAPHY (ERCP) WITH PROPOFOL;  Surgeon: Meridee Score Netty Starring., MD;  Location: [facility] ENDOSCOPY;  Service: Gastroenterology;  Laterality: N/A;   ERCP N/A 02/07/2019   Procedure: ENDOSCOPIC RETROGRADE CHOLANGIOPANCREATOGRAPHY (ERCP);  Surgeon: Lemar Lofty., MD;  Location: [facility] ENDOSCOPY;  Service: Gastroenterology;  Laterality: N/A;   ESOPHAGOGASTRODUODENOSCOPY (EGD) WITH PROPOFOL N/A 02/07/2019   Procedure: ESOPHAGOGASTRODUODENOSCOPY (EGD) WITH PROPOFOL;  Surgeon: Meridee Score Netty Starring., MD;  Location: [facility] ENDOSCOPY;  Service: Gastroenterology;  Laterality: N/A;   ESOPHAGOGASTRODUODENOSCOPY (EGD) WITH PROPOFOL N/A 07/04/2019   Procedure: ESOPHAGOGASTRODUODENOSCOPY (EGD) WITH PROPOFOL;  Surgeon: Meridee Score Netty Starring., MD;  Location: [facility] ENDOSCOPY;  Service: Gastroenterology;  Laterality: N/A;   EUS N/A 07/04/2019   Procedure: UPPER ENDOSCOPIC ULTRASOUND (EUS) RADIAL;  Surgeon: Lemar Lofty., MD;  Location: [facility] ENDOSCOPY;  Service:  Gastroenterology;  Laterality: N/A;   JOINT REPLACEMENT Bilateral    knee   KNEE ARTHROPLASTY Right 05/17/2016   Procedure: RIGHT TOTAL KNEE ARTHROPLASTY WITH COMPUTER NAVIGATION;  Surgeon: Samson Frederic, MD;  [facility] OR;  Service: Orthopedics;  Laterality: Right;  Needs RNFA   KNEE ARTHROPLASTY Left 01/24/2017   Procedure: LEFT TOTAL KNEE ARTHROPLASTY WITH COMPUTER NAVIGATION;  Surgeon: Samson Frederic, MD;  Location: [facility] OR;  Service: Orthopedics;  Laterality: Left;  Dr. requesting RNFA   LAPAROSCOPY  1999   done at [facility], "something with separating my organs because things had grown together"   REMOVAL OF STONES  02/07/2019   Procedure: REMOVAL OF STONES;  Surgeon: Lemar Lofty., MD;  Location: [facility] ENDOSCOPY;  Service: Gastroenterology;;   Dennison Mascot  02/07/2019   Procedure: Dennison Mascot;  Surgeon: Lemar Lofty., MD;  Location: [facility] ENDOSCOPY;  Service: Gastroenterology;;   TONSILLECTOMY     TUBAL LIGATION     UPPER ESOPHAGEAL ENDOSCOPIC ULTRASOUND (EUS)  02/07/2019   Procedure: UPPER ESOPHAGEAL ENDOSCOPIC ULTRASOUND (EUS);  Surgeon: Lemar Lofty., MD;  Location: [facility] ENDOSCOPY;  Service: Gastroenterology;;  MEDICATIONS: No current facility-administered medications for this encounter.    allopurinol (ZYLOPRIM) 100 MG tablet   amLODipine (NORVASC) 5 MG tablet   ascorbic acid (VITAMIN C) 500 MG tablet   aspirin EC 81 MG tablet   bumetanide (BUMEX) 1 MG tablet   Camphor-Menthol-Methyl Sal (SALONPAS) 3.10-16-08 % PTCH   cetirizine (ZYRTEC) 10 MG tablet   Cholecalciferol (VITAMIN D3) 1.25 MG (50000 UT) CAPS   Cyanocobalamin (VITAMIN B-12) 2500 MCG SUBL   diclofenac sodium (VOLTAREN) 1 % GEL   doxazosin (CARDURA XL) 4 MG 24 hr tablet   Exenatide ER (BYDUREON BCISE) 2 MG/0.85ML AUIJ   glimepiride (AMARYL) 4 MG tablet   losartan (COZAAR) 100 MG tablet   meclizine (ANTIVERT) 25 MG tablet   metoprolol succinate (TOPROL-XL) 100 MG 24 hr tablet    pantoprazole (PROTONIX) 40 MG tablet   Polyethyl Glycol-Propyl Glycol (LUBRICANT EYE DROPS) 0.4-0.3 % SOLN   polyethylene glycol (MIRALAX / GLYCOLAX) 17 g packet   potassium chloride (K-DUR) 10 MEQ tablet   rosuvastatin (CRESTOR) 10 MG tablet   tiZANidine (ZANAFLEX) 4 MG tablet   TOUJEO SOLOSTAR 300 UNIT/ML SOPN   glucose blood test strip   Insulin Pen Needle (NOVOFINE) 32G X 6 MM MISC    Shonna Chock, PA-C Surgical Short Stay/Anesthesiology [facility] Phone [phone] [facility] Phone [phone] 05/06/2023 1:39 PM

## 2023-05-09 ENCOUNTER — Ambulatory Visit (HOSPITAL_COMMUNITY): Payer: Medicare Other

## 2023-05-09 ENCOUNTER — Other Ambulatory Visit: Payer: Self-pay

## 2023-05-09 ENCOUNTER — Encounter (HOSPITAL_COMMUNITY)
Admission: RE | Disposition: A | Discharge status: Home / Self Care | Payer: Self-pay | Source: Home / Self Care | Attending: Emergency Medicine

## 2023-05-09 ENCOUNTER — Encounter (HOSPITAL_COMMUNITY): Payer: Self-pay | Admitting: Emergency Medicine

## 2023-05-09 ENCOUNTER — Ambulatory Visit (HOSPITAL_COMMUNITY)
Admission: RE | Admit: 2023-05-09 | Discharge: 2023-05-09 | Disposition: A | Discharge status: Home / Self Care | Payer: Medicare Other | Attending: Emergency Medicine | Admitting: Emergency Medicine

## 2023-05-09 ENCOUNTER — Ambulatory Visit (HOSPITAL_BASED_OUTPATIENT_CLINIC_OR_DEPARTMENT_OTHER): Payer: Medicare Other | Admitting: Vascular Surgery

## 2023-05-09 ENCOUNTER — Ambulatory Visit (HOSPITAL_COMMUNITY): Payer: Self-pay | Admitting: Vascular Surgery

## 2023-05-09 DIAGNOSIS — N189 Chronic kidney disease, unspecified: Secondary | ICD-10-CM | POA: Diagnosis not present

## 2023-05-09 DIAGNOSIS — I129 Hypertensive chronic kidney disease with stage 1 through stage 4 chronic kidney disease, or unspecified chronic kidney disease: Secondary | ICD-10-CM | POA: Diagnosis not present

## 2023-05-09 DIAGNOSIS — C3411 Malignant neoplasm of upper lobe, right bronchus or lung: Secondary | ICD-10-CM

## 2023-05-09 DIAGNOSIS — E119 Type 2 diabetes mellitus without complications: Secondary | ICD-10-CM

## 2023-05-09 DIAGNOSIS — Z853 Personal history of malignant neoplasm of breast: Secondary | ICD-10-CM | POA: Diagnosis not present

## 2023-05-09 DIAGNOSIS — E1122 Type 2 diabetes mellitus with diabetic chronic kidney disease: Secondary | ICD-10-CM | POA: Diagnosis not present

## 2023-05-09 DIAGNOSIS — I10 Essential (primary) hypertension: Secondary | ICD-10-CM

## 2023-05-09 DIAGNOSIS — R918 Other nonspecific abnormal finding of lung field: Secondary | ICD-10-CM | POA: Diagnosis not present

## 2023-05-09 DIAGNOSIS — Z794 Long term (current) use of insulin: Secondary | ICD-10-CM

## 2023-05-09 DIAGNOSIS — I340 Nonrheumatic mitral (valve) insufficiency: Secondary | ICD-10-CM | POA: Diagnosis not present

## 2023-05-09 DIAGNOSIS — E7800 Pure hypercholesterolemia, unspecified: Secondary | ICD-10-CM | POA: Diagnosis not present

## 2023-05-09 DIAGNOSIS — R9389 Abnormal findings on diagnostic imaging of other specified body structures: Secondary | ICD-10-CM | POA: Diagnosis not present

## 2023-05-09 DIAGNOSIS — Z923 Personal history of irradiation: Secondary | ICD-10-CM | POA: Diagnosis not present

## 2023-05-09 HISTORY — PX: BRONCHIAL BIOPSY: SHX5109

## 2023-05-09 HISTORY — PX: VIDEO BRONCHOSCOPY WITH RADIAL ENDOBRONCHIAL ULTRASOUND: SHX6849

## 2023-05-09 HISTORY — PX: BRONCHIAL NEEDLE ASPIRATION BIOPSY: SHX5106

## 2023-05-09 HISTORY — PX: BRONCHIAL BRUSHINGS: SHX5108

## 2023-05-09 LAB — BASIC METABOLIC PANEL
Anion gap: 10 (ref 5–15)
BUN: 24 mg/dL — ABNORMAL HIGH (ref 8–23)
CO2: 22 mmol/L (ref 22–32)
Calcium: 10 mg/dL (ref 8.9–10.3)
Chloride: 106 mmol/L (ref 98–111)
Creatinine, Ser: 1.21 mg/dL — ABNORMAL HIGH (ref 0.44–1.00)
GFR, Estimated: 47 mL/min — ABNORMAL LOW (ref >60)
Glucose, Bld: 120 mg/dL — ABNORMAL HIGH (ref 70–99)
Potassium: 3.7 mmol/L (ref 3.5–5.1)
Sodium: 138 mmol/L (ref 135–145)

## 2023-05-09 LAB — CBC
HCT: 35.4 % — ABNORMAL LOW (ref 36.0–46.0)
Hemoglobin: 11.1 g/dL — ABNORMAL LOW (ref 12.0–15.0)
MCH: 28.4 pg (ref 26.0–34.0)
MCHC: 31.4 g/dL (ref 30.0–36.0)
MCV: 90.5 fL (ref 80.0–100.0)
Platelets: 192 10*3/uL (ref 150–400)
RBC: 3.91 MIL/uL (ref 3.87–5.11)
RDW: 13.2 % (ref 11.5–15.5)
WBC: 5.3 10*3/uL (ref 4.0–10.5)
nRBC: 0 % (ref 0.0–0.2)

## 2023-05-09 LAB — GLUCOSE, CAPILLARY
Glucose-Capillary: 124 mg/dL — ABNORMAL HIGH (ref 70–99)
Glucose-Capillary: 129 mg/dL — ABNORMAL HIGH (ref 70–99)

## 2023-05-09 SURGERY — BRONCHOSCOPY, WITH BIOPSY USING ELECTROMAGNETIC NAVIGATION
Anesthesia: General

## 2023-05-09 MED ORDER — FENTANYL CITRATE (PF) 100 MCG/2ML IJ SOLN: 25.0000 ug | INTRAMUSCULAR | Status: DC | PRN

## 2023-05-09 MED ORDER — LACTATED RINGERS IV SOLN: INTRAVENOUS | Status: DC

## 2023-05-09 MED ORDER — FENTANYL CITRATE (PF) 250 MCG/5ML IJ SOLN
INTRAMUSCULAR | Status: DC | PRN
  Administered 2023-05-09: 100 ug via INTRAVENOUS

## 2023-05-09 MED ORDER — CHLORHEXIDINE GLUCONATE 0.12 % MT SOLN
OROMUCOSAL | Status: AC
  Administered 2023-05-09: 15 mL via OROMUCOSAL
  Filled 2023-05-09: qty 15

## 2023-05-09 MED ORDER — CHLORHEXIDINE GLUCONATE 0.12 % MT SOLN: 15.0000 mL | Freq: Once | OROMUCOSAL | Status: AC

## 2023-05-09 MED ORDER — DEXAMETHASONE SODIUM PHOSPHATE 10 MG/ML IJ SOLN
INTRAMUSCULAR | Status: DC | PRN
  Administered 2023-05-09: 10 mg via INTRAVENOUS

## 2023-05-09 MED ORDER — OXYCODONE HCL 5 MG/5ML PO SOLN: 5.0000 mg | Freq: Once | ORAL | Status: DC | PRN

## 2023-05-09 MED ORDER — PROPOFOL 500 MG/50ML IV EMUL
INTRAVENOUS | Status: DC | PRN
  Administered 2023-05-09: 125 ug/kg/min via INTRAVENOUS

## 2023-05-09 MED ORDER — LIDOCAINE 2% (20 MG/ML) 5 ML SYRINGE
INTRAMUSCULAR | Status: DC | PRN
  Administered 2023-05-09: 60 mg via INTRAVENOUS

## 2023-05-09 MED ORDER — OXYCODONE HCL 5 MG PO TABS: 5.0000 mg | ORAL_TABLET | Freq: Once | ORAL | Status: DC | PRN

## 2023-05-09 MED ORDER — INSULIN ASPART 100 UNIT/ML IJ SOLN: 0.0000 [IU] | INTRAMUSCULAR | Status: DC | PRN

## 2023-05-09 MED ORDER — SUGAMMADEX SODIUM 200 MG/2ML IV SOLN
INTRAVENOUS | Status: DC | PRN
  Administered 2023-05-09: 200 mg via INTRAVENOUS

## 2023-05-09 MED ORDER — ONDANSETRON HCL 4 MG/2ML IJ SOLN: 4.0000 mg | Freq: Four times a day (QID) | INTRAMUSCULAR | Status: DC | PRN

## 2023-05-09 MED ORDER — ROCURONIUM BROMIDE 10 MG/ML (PF) SYRINGE
PREFILLED_SYRINGE | INTRAVENOUS | Status: DC | PRN
  Administered 2023-05-09: 50 mg via INTRAVENOUS

## 2023-05-09 MED ORDER — ONDANSETRON HCL 4 MG/2ML IJ SOLN
INTRAMUSCULAR | Status: DC | PRN
  Administered 2023-05-09: 4 mg via INTRAVENOUS

## 2023-05-09 MED ORDER — PROPOFOL 10 MG/ML IV BOLUS
INTRAVENOUS | Status: DC | PRN
  Administered 2023-05-09: 150 mg via INTRAVENOUS

## 2023-05-09 NOTE — Interval H&P Note (Signed)
History and Physical Interval Note:  05/09/2023 11:31 AM  Carol Keith  has presented today for surgery, with the diagnosis of RIGHT UPPER LOBE MASS.  The various methods of treatment have been discussed with the patient and family. After consideration of risks, benefits and other options for treatment, the patient has consented to  Procedure(s): ROBOTIC ASSISTED NAVIGATIONAL BRONCHOSCOPY (N/A) as a surgical intervention.  The patient's history has been reviewed, patient examined, no change in status, stable for surgery.  I have reviewed the patient's chart and labs.  Questions were answered to the patient's satisfaction.     Leslye Peer

## 2023-05-09 NOTE — Transfer of Care (Signed)
Immediate Anesthesia Transfer of Care Note  Patient: Carol Keith  Procedure(s) Performed: ROBOTIC ASSISTED NAVIGATIONAL BRONCHOSCOPY VIDEO BRONCHOSCOPY WITH RADIAL ENDOBRONCHIAL ULTRASOUND BRONCHIAL BRUSHINGS BRONCHIAL NEEDLE ASPIRATION BIOPSIES BRONCHIAL BIOPSIES  Patient Location: PACU  Anesthesia Type:General  Level of Consciousness: drowsy  Airway & Oxygen Therapy: Patient Spontanous Breathing  Post-op Assessment: Report given to RN and Post -op Vital signs reviewed and stable  Post vital signs: Reviewed and stable  Last Vitals:  Vitals Value Taken Time  BP 147/61 05/09/23 1240  Temp    Pulse 78 05/09/23 1241  Resp 19 05/09/23 1241  SpO2 100 % 05/09/23 1241  Vitals shown include unfiled device data.  Last Pain:  Vitals:   05/09/23 1028  TempSrc:   PainSc: 0-No pain         Complications: No notable events documented.

## 2023-05-09 NOTE — Op Note (Signed)
Video Bronchoscopy with Robotic Assisted Bronchoscopic Navigation   Date of Operation: 05/09/2023   Pre-op Diagnosis: Right upper lobe mass  Post-op Diagnosis: Same  Surgeon: Levy Pupa  Assistants: None  Anesthesia: General endotracheal anesthesia  Operation: Flexible video fiberoptic bronchoscopy with robotic assistance and biopsies.  Estimated Blood Loss: Minimal  Complications: None  Indications and History: Carol Keith is a 72 y.o. female with history of remote right breast cancer.  She did receive adjuvant XRT.  She was found to have a new right upper lobe mass on CT scan of the chest that was done for cough.  Recommendation made to achieve a tissue diagnosis via robotic assisted navigational bronchoscopy. The risks, benefits, complications, treatment options and expected outcomes were discussed with the patient.  The possibilities of pneumothorax, pneumonia, reaction to medication, pulmonary aspiration, perforation of a viscus, bleeding, failure to diagnose a condition and creating a complication requiring transfusion or operation were discussed with the patient who freely signed the consent.    Description of Procedure: The patient was seen in the Preoperative Area, was examined and was deemed appropriate to proceed.  The patient was taken to [facility] endoscopy room 3, identified as Carol Keith and the procedure verified as Flexible Video Fiberoptic Bronchoscopy.  A Time Out was held and the above information confirmed.   Prior to the date of the procedure a high-resolution CT scan of the chest was performed. Utilizing ION software program a virtual tracheobronchial tree was generated to allow the creation of distinct navigation pathways to the patient's parenchymal abnormalities. After being taken to the operating room general anesthesia was initiated and the patient  was orally intubated. The video fiberoptic bronchoscope was introduced via the endotracheal tube and a  general inspection was performed which showed normal right and left lung anatomy. Aspiration of the bilateral mainstems was completed to remove any remaining secretions. Robotic catheter inserted into patient's endotracheal tube.   Target #1 right upper lobe mass: The distinct navigation pathways prepared prior to this procedure were then utilized to navigate to patient's lesion identified on CT scan. The robotic catheter was secured into place and the vision probe was withdrawn.  Lesion location was approximated using fluoroscopy and radial endobronchial ultrasound for peripheral targeting. Under fluoroscopic guidance transbronchial needle brushings, transbronchial needle biopsies, and transbronchial forceps biopsies were performed to be sent for cytology and pathology.   At the end of the procedure a general airway inspection was performed and there was no evidence of active bleeding. The bronchoscope was removed.  The patient tolerated the procedure well. There was no significant blood loss and there were no obvious complications. A post-procedural chest x-ray is pending.  Samples Target #1: 1. Transbronchial needle brushings from right upper lobe mass 2. Transbronchial Wang needle biopsies from right upper lobe mass 3. Transbronchial forceps biopsies from right upper lobe mass  Plans:  The patient will be discharged from the PACU to home when recovered from anesthesia and after chest x-ray is reviewed. We will review the cytology, pathology and results with the patient when they become available. Outpatient followup will be with Dr. Delton Coombes.   Levy Pupa, MD, PhD 05/09/2023, 12:31 PM [facility] [phone] or if no answer before 7:00PM call [phone] For any issues after 7:00PM please call eLink [phone]

## 2023-05-09 NOTE — Discharge Instructions (Addendum)
Flexible Bronchoscopy, Care After This sheet gives you information about how to care for yourself after your test. Your doctor may also give you more specific instructions. If you have problems or questions, contact your doctor. Follow these instructions at home: Eating and drinking When your numbness is gone and your cough and gag reflexes have come back, you may: Eat only soft foods. Slowly drink liquids. The day after the test, go back to your normal diet. Driving Do not drive for 24 hours if you were given a medicine to help you relax (sedative). Do not drive or use heavy machinery while taking prescription pain medicine. General instructions  Take over-the-counter and prescription medicines only as told by your doctor. Return to your normal activities as told. Ask what activities are safe for you. Do not use any products that have nicotine or tobacco in them. This includes cigarettes and e-cigarettes. If you need help quitting, ask your doctor. Keep all follow-up visits as told by your doctor. This is important. It is very important if you had a tissue sample (biopsy) taken. Get help right away if: You have shortness of breath that gets worse. You get light-headed. You feel like you are going to pass out (faint). You have chest pain. You cough up: More than a little blood. More blood than before. Summary Do not eat or drink anything (not even water) for 2 hours after your test, or until your numbing medicine wears off. Do not use cigarettes. Do not use e-cigarettes. Get help right away if you have chest pain.  Please call our office for any questions or concerns.  [phone]. Okay to restart your aspirin on 05/10/2023.    This information is not intended to replace advice given to you by your health care provider. Make sure you discuss any questions you have with your health care provider. Document Released: 07/25/2009 Document Revised: 09/09/2017 Document Reviewed:  10/15/2016 Elsevier Patient Education  2020 [employer].

## 2023-05-09 NOTE — Anesthesia Procedure Notes (Signed)
Procedure Name: Intubation Date/Time: 05/09/2023 11:56 AM  Performed by: Garfield Cornea, CRNAPre-anesthesia Checklist: Patient identified, Emergency Drugs available, Suction available and Patient being monitored Patient Re-evaluated:Patient Re-evaluated prior to induction Oxygen Delivery Method: Circle System Utilized Preoxygenation: Pre-oxygenation with 100% oxygen Induction Type: IV induction Ventilation: Mask ventilation without difficulty and Oral airway inserted - appropriate to patient size Laryngoscope Size: Mac and 3 Grade View: Grade I Tube type: Oral Tube size: 8.5 mm Number of attempts: 1 Airway Equipment and Method: Stylet and Oral airway Placement Confirmation: ETT inserted through vocal cords under direct vision, positive ETCO2 and breath sounds checked- equal and bilateral Secured at: 23 cm Tube secured with: Tape Dental Injury: Teeth and Oropharynx as per pre-operative assessment

## 2023-05-10 NOTE — Anesthesia Postprocedure Evaluation (Signed)
Anesthesia Post Note  Patient: Carol Keith  Procedure(s) Performed: ROBOTIC ASSISTED NAVIGATIONAL BRONCHOSCOPY VIDEO BRONCHOSCOPY WITH RADIAL ENDOBRONCHIAL ULTRASOUND BRONCHIAL BRUSHINGS BRONCHIAL NEEDLE ASPIRATION BIOPSIES BRONCHIAL BIOPSIES     Patient location during evaluation: PACU Anesthesia Type: General Level of consciousness: awake and alert Pain management: pain level controlled Vital Signs Assessment: post-procedure vital signs reviewed and stable Respiratory status: spontaneous breathing, nonlabored ventilation, respiratory function stable and patient connected to nasal cannula oxygen Cardiovascular status: blood pressure returned to baseline and stable Postop Assessment: no apparent nausea or vomiting Anesthetic complications: no   No notable events documented.  Last Vitals:  Vitals:   05/09/23 1315 05/09/23 1330  BP: (!) 160/69 (!) 165/65  Pulse: 78 80  Resp: 12 17  Temp:    SpO2: 100% 96%    Last Pain:  Vitals:   05/09/23 1330  TempSrc:   PainSc: 0-No pain                 Becket Wecker S

## 2023-05-11 DIAGNOSIS — I10 Essential (primary) hypertension: Secondary | ICD-10-CM | POA: Diagnosis not present

## 2023-05-12 ENCOUNTER — Encounter (HOSPITAL_COMMUNITY): Payer: Self-pay | Admitting: Emergency Medicine

## 2023-05-12 ENCOUNTER — Telehealth: Payer: Self-pay | Admitting: Emergency Medicine

## 2023-05-12 DIAGNOSIS — C3491 Malignant neoplasm of unspecified part of right bronchus or lung: Secondary | ICD-10-CM

## 2023-05-12 NOTE — Telephone Encounter (Signed)
Spoke with the patient and reviewed her biopsy results with her.  Shows adenocarcinoma, lung primary.  I will refer her to oncology so we can determine best strategy for treatment.

## 2023-05-19 ENCOUNTER — Other Ambulatory Visit: Payer: Self-pay

## 2023-05-20 NOTE — Progress Notes (Signed)
The proposed treatment discussed in conference is for discussion purpose only and is not a binding recommendation.  The patients have not been physically examined, or presented with their treatment options.  Therefore, final treatment plans cannot be decided.  

## 2023-05-23 ENCOUNTER — Telehealth: Payer: Self-pay | Admitting: Medical Oncology

## 2023-05-23 NOTE — Telephone Encounter (Signed)
Gave email address for FMLA to dtr.

## 2023-05-24 ENCOUNTER — Other Ambulatory Visit: Payer: Self-pay

## 2023-05-24 DIAGNOSIS — R918 Other nonspecific abnormal finding of lung field: Secondary | ICD-10-CM

## 2023-05-25 ENCOUNTER — Other Ambulatory Visit: Payer: Self-pay | Admitting: Internal Medicine

## 2023-05-25 ENCOUNTER — Inpatient Hospital Stay: Payer: Medicare Other | Attending: Internal Medicine | Admitting: Internal Medicine

## 2023-05-25 ENCOUNTER — Other Ambulatory Visit: Payer: Self-pay

## 2023-05-25 ENCOUNTER — Inpatient Hospital Stay: Payer: Medicare Other

## 2023-05-25 VITALS — BP 152/57 | HR 72 | Temp 97.8°F | Resp 16 | Ht 63.0 in | Wt 193.9 lb

## 2023-05-25 DIAGNOSIS — Z808 Family history of malignant neoplasm of other organs or systems: Secondary | ICD-10-CM | POA: Diagnosis not present

## 2023-05-25 DIAGNOSIS — Z803 Family history of malignant neoplasm of breast: Secondary | ICD-10-CM | POA: Diagnosis not present

## 2023-05-25 DIAGNOSIS — C3411 Malignant neoplasm of upper lobe, right bronchus or lung: Secondary | ICD-10-CM | POA: Insufficient documentation

## 2023-05-25 DIAGNOSIS — C3490 Malignant neoplasm of unspecified part of unspecified bronchus or lung: Secondary | ICD-10-CM

## 2023-05-25 DIAGNOSIS — R918 Other nonspecific abnormal finding of lung field: Secondary | ICD-10-CM

## 2023-05-25 DIAGNOSIS — Z853 Personal history of malignant neoplasm of breast: Secondary | ICD-10-CM | POA: Diagnosis not present

## 2023-05-25 LAB — CBC WITH DIFFERENTIAL/PLATELET
Abs Immature Granulocytes: 0.01 10*3/uL (ref 0.00–0.07)
Basophils Absolute: 0 10*3/uL (ref 0.0–0.1)
Basophils Relative: 1 %
Eosinophils Absolute: 0.1 10*3/uL (ref 0.0–0.5)
Eosinophils Relative: 2 %
HCT: 32.1 % — ABNORMAL LOW (ref 36.0–46.0)
Hemoglobin: 10.4 g/dL — ABNORMAL LOW (ref 12.0–15.0)
Immature Granulocytes: 0 %
Lymphocytes Relative: 45 %
Lymphs Abs: 2 10*3/uL (ref 0.7–4.0)
MCH: 28.5 pg (ref 26.0–34.0)
MCHC: 32.4 g/dL (ref 30.0–36.0)
MCV: 87.9 fL (ref 80.0–100.0)
Monocytes Absolute: 0.6 10*3/uL (ref 0.1–1.0)
Monocytes Relative: 14 %
Neutro Abs: 1.7 10*3/uL (ref 1.7–7.7)
Neutrophils Relative %: 38 %
Platelets: 161 10*3/uL (ref 150–400)
RBC: 3.65 MIL/uL — ABNORMAL LOW (ref 3.87–5.11)
RDW: 13.4 % (ref 11.5–15.5)
WBC: 4.4 10*3/uL (ref 4.0–10.5)
nRBC: 0 % (ref 0.0–0.2)

## 2023-05-25 LAB — COMPREHENSIVE METABOLIC PANEL
ALT: 14 U/L (ref 0–44)
AST: 18 U/L (ref 15–41)
Albumin: 3.8 g/dL (ref 3.5–5.0)
Alkaline Phosphatase: 53 U/L (ref 38–126)
Anion gap: 6 (ref 5–15)
BUN: 23 mg/dL (ref 8–23)
CO2: 26 mmol/L (ref 22–32)
Calcium: 9.8 mg/dL (ref 8.9–10.3)
Chloride: 110 mmol/L (ref 98–111)
Creatinine, Ser: 1.24 mg/dL — ABNORMAL HIGH (ref 0.44–1.00)
GFR, Estimated: 46 mL/min — ABNORMAL LOW (ref >60)
Glucose, Bld: 149 mg/dL — ABNORMAL HIGH (ref 70–99)
Potassium: 3.8 mmol/L (ref 3.5–5.1)
Sodium: 142 mmol/L (ref 135–145)
Total Bilirubin: 0.2 mg/dL — ABNORMAL LOW (ref 0.3–1.2)
Total Protein: 6.5 g/dL (ref 6.5–8.1)

## 2023-05-25 NOTE — Progress Notes (Signed)
[facility] Telephone:([phone]) [phone]   [phone]  CONSULT NOTE  REFERRING PHYSICIAN: Dr. Levy Pupa  REASON FOR CONSULTATION:  72 years old African-American female with lung cancer.  HPI Carol Keith is a 72 y.o. female is a never smoker with past medical history significant for osteoarthritis, history of right breast cancer in 2005 for s/p lumpectomy followed by adjuvant radiotherapy, bursitis of the right hip, chronic kidney disease, diabetes mellitus, heart murmur, hypertension, dyslipidemia, left shoulder pain, and gout.  She has been complaining of chronic cough for around 6 months.  She had chest x-ray performed at [facility] healthcare and that showed interval increased density projecting over the medial aspect of the right clavicle on the frontal examination suspicious for a pulmonary nodule or pleural-based lesion.  There was no evidence of pneumonia or other acute process.  This was followed by CT scan of the chest on 03/25/2023 at [facility] healthcare and it showed apical segment right upper lobe mass measuring 2.3 x 3.8 cm and corresponds to the abnormality on the recent chest x-ray.  It has approximately 2.6 cm contact with the adjacent mediastinum, increasing the likelihood of microinvasion.  The patient was then referred to Dr. Delton Coombes and on 05/09/2023 she underwent video bronchoscopy with robotic assisted bronchoscopic navigation.  The final pathology from the fine-needle aspiration as well as the brushing of the right upper lobe mass ([id number]) was consistent with adenocarcinoma with lung primary. Given the patient's remote history of breast cancer, two imunohistochemical stains are performed with adequate control to further  elucidate the histogenesis of this tumor.  The tumor is positive for the pulmonary adeno marker TTF-1.  The tumor is negative for the breast marker GATA3.  This immunohistochemical profile supports the above diagnosis.  The patient had a PET  scan on May 01, 2023 and that showed apical segment right upper lobe mass measuring 3.2 x 4.0 cm with SUV max of 8.9.  There was no evidence of distant metastatic disease and no other abnormality in the chest. Dr. Delton Coombes kindly referred the patient to me today for evaluation and recommendation regarding treatment of her condition. When seen today the patient is feeling fine except for mild cough from recent cold symptoms.  She denied having any chest pain, shortness of breath or hemoptysis.  She has no weight loss or night sweats.  She has no nausea, vomiting, diarrhea or constipation.  She has mild headache with no visual changes. Family history significant for mother with a stroke and diabetes.  Father had brain cancer.  Sister had breast cancer.  Daughter had breast cancer with BRCA mutation. The patient is married and has 2 daughters.  She was accompanied today by her daughter Carol Keith.  She used to work in Geophysicist/field seismologist.  She has no history of smoking, alcohol or drug abuse.  HPI  Past Medical History:  Diagnosis Date   Allergic rhinitis    Arthritis    kneees, back    Breast cancer (HCC)    Bursitis of right hip    Cancer (HCC)    breast cancer, right   Cerumen impaction    Chronic kidney disease    appt. schedule for Dr. 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   Dizziness    Fatigue    Frequent urination    Gout    Heart murmur    Echo11/21/16 ([facility]): LVEF  50-55%, reduced LV diastolic function, mild TR    Hypercholesteremia    Hypertension    Hypokalemia    Left foot pain    Left shoulder pain    Lumbago    Oral candida    Ovarian failure    Plantar fascial fibromatosis    PONV (postoperative nausea and vomiting)    Proteinuria    Right knee pain     Past Surgical History:  Procedure Laterality Date   ABDOMINAL HYSTERECTOMY     complete   APPENDECTOMY     BILIARY BRUSHING  02/07/2019   Procedure:  BILIARY BRUSHING;  Surgeon: Lemar Lofty., MD;  Location: [facility] ENDOSCOPY;  Service: Gastroenterology;;   BILIARY BRUSHING  07/04/2019   Procedure: BILIARY BRUSHING;  Surgeon: Lemar Lofty., MD;  Location: [facility] ENDOSCOPY;  Service: Gastroenterology;;   BILIARY DILATION  02/07/2019   Procedure: BILIARY DILATION;  Surgeon: Lemar Lofty., MD;  Location: [facility] ENDOSCOPY;  Service: Gastroenterology;;   BILIARY DILATION  07/04/2019   Procedure: BILIARY DILATION;  Surgeon: Lemar Lofty., MD;  Location: [facility] ENDOSCOPY;  Service: Gastroenterology;;   BIOPSY  02/07/2019   Procedure: BIOPSY;  Surgeon: Lemar Lofty., MD;  Location: [facility] ENDOSCOPY;  Service: Gastroenterology;;   BIOPSY  07/04/2019   Procedure: BIOPSY;  Surgeon: Lemar Lofty., MD;  Location: [facility] ENDOSCOPY;  Service: Gastroenterology;;   BREAST LUMPECTOMY Right 2015   BREAST SURGERY Right    for Breast Ca, tx with surg. & chemo & radiation     BRONCHIAL BIOPSY  05/09/2023   Procedure: BRONCHIAL BIOPSIES;  Surgeon: Leslye Peer, MD;  Location: [facility] ENDOSCOPY;  Service: Pulmonary;;   BRONCHIAL BRUSHINGS  05/09/2023   Procedure: BRONCHIAL BRUSHINGS;  Surgeon: Leslye Peer, MD;  Location: [facility] ENDOSCOPY;  Service: Pulmonary;;   BRONCHIAL NEEDLE ASPIRATION BIOPSY  05/09/2023   Procedure: BRONCHIAL NEEDLE ASPIRATION BIOPSIES;  Surgeon: Leslye Peer, MD;  Location: [facility] ENDOSCOPY;  Service: Pulmonary;;   CATARACT EXTRACTION W/PHACO Left 08/06/2019   Procedure: CATARACT EXTRACTION PHACO AND INTRAOCULAR LENS PLACEMENT LEFT EYE;  Surgeon: Fabio Pierce, MD;  Location: [facility] ORS;  Service: Ophthalmology;  Laterality: Left;  CDE: 9.01   CATARACT EXTRACTION W/PHACO Right 08/20/2019   Procedure: CATARACT EXTRACTION PHACO AND INTRAOCULAR LENS PLACEMENT RIGHT EYE CDE=5.88;  Surgeon: Fabio Pierce, MD;  Location: [facility] ORS;  Service: Ophthalmology;  Laterality: Right;  right   CHOLECYSTECTOMY     COLONOSCOPY      ENDOSCOPIC RETROGRADE CHOLANGIOPANCREATOGRAPHY (ERCP) WITH PROPOFOL N/A 07/04/2019   Procedure: ENDOSCOPIC RETROGRADE CHOLANGIOPANCREATOGRAPHY (ERCP) WITH PROPOFOL;  Surgeon: Meridee Score Netty Starring., MD;  Location: [facility] ENDOSCOPY;  Service: Gastroenterology;  Laterality: N/A;   ERCP N/A 02/07/2019   Procedure: ENDOSCOPIC RETROGRADE CHOLANGIOPANCREATOGRAPHY (ERCP);  Surgeon: Lemar Lofty., MD;  Location: [facility] ENDOSCOPY;  Service: Gastroenterology;  Laterality: N/A;   ESOPHAGOGASTRODUODENOSCOPY (EGD) WITH PROPOFOL N/A 02/07/2019   Procedure: ESOPHAGOGASTRODUODENOSCOPY (EGD) WITH PROPOFOL;  Surgeon: Meridee Score Netty Starring., MD;  Location: [facility] ENDOSCOPY;  Service: Gastroenterology;  Laterality: N/A;   ESOPHAGOGASTRODUODENOSCOPY (EGD) WITH PROPOFOL N/A 07/04/2019   Procedure: ESOPHAGOGASTRODUODENOSCOPY (EGD) WITH PROPOFOL;  Surgeon: Meridee Score Netty Starring., MD;  Location: [facility] ENDOSCOPY;  Service: Gastroenterology;  Laterality: N/A;   EUS N/A 07/04/2019   Procedure: UPPER ENDOSCOPIC ULTRASOUND (EUS) RADIAL;  Surgeon: Lemar Lofty., MD;  Location: [facility] ENDOSCOPY;  Service: Gastroenterology;  Laterality: N/A;   JOINT REPLACEMENT Bilateral    knee   KNEE ARTHROPLASTY Right 05/17/2016   Procedure: RIGHT TOTAL KNEE ARTHROPLASTY WITH COMPUTER  NAVIGATION;  Surgeon: Samson Frederic, MD;  Location: [facility] OR;  Service: Orthopedics;  Laterality: Right;  Needs RNFA   KNEE ARTHROPLASTY Left 01/24/2017   Procedure: LEFT TOTAL KNEE ARTHROPLASTY WITH COMPUTER NAVIGATION;  Surgeon: Samson Frederic, MD;  Location: [facility] OR;  Service: Orthopedics;  Laterality: Left;  Dr. requesting RNFA   LAPAROSCOPY  1999   done at [facility], "something with separating my organs because things had grown together"   REMOVAL OF STONES  02/07/2019   Procedure: REMOVAL OF STONES;  Surgeon: Lemar Lofty., MD;  Location: [facility] ENDOSCOPY;  Service: Gastroenterology;;   Dennison Mascot  02/07/2019   Procedure: Dennison Mascot;   Surgeon: Lemar Lofty., MD;  Location: [facility] ENDOSCOPY;  Service: Gastroenterology;;   TONSILLECTOMY     TUBAL LIGATION     UPPER ESOPHAGEAL ENDOSCOPIC ULTRASOUND (EUS)  02/07/2019   Procedure: UPPER ESOPHAGEAL ENDOSCOPIC ULTRASOUND (EUS);  Surgeon: Meridee Score Netty Starring., MD;  Location: [facility] ENDOSCOPY;  Service: Gastroenterology;;   VIDEO BRONCHOSCOPY WITH RADIAL ENDOBRONCHIAL ULTRASOUND  05/09/2023   Procedure: VIDEO BRONCHOSCOPY WITH RADIAL ENDOBRONCHIAL ULTRASOUND;  Surgeon: Leslye Peer, MD;  Location: [facility] ENDOSCOPY;  Service: Pulmonary;;    Family History  Problem Relation Age of Onset   Breast cancer Sister        age 18   BRCA 1/2 Daughter    Breast cancer Daughter     Social History Social History   Tobacco Use   Smoking status: Never   Smokeless tobacco: Never  Vaping Use   Vaping status: Never Used  Substance Use Topics   Alcohol use: No   Drug use: No    Allergies  Allergen Reactions   Ace Inhibitors     Cough    Actos [Pioglitazone]     Edema    Lipitor [Atorvastatin]     Chest tightness    Metformin And Related Diarrhea    Current Outpatient Medications  Medication Sig Dispense Refill   allopurinol (ZYLOPRIM) 100 MG tablet Take 100 mg by mouth daily.      amLODipine (NORVASC) 5 MG tablet Take 5 mg by mouth daily.     ascorbic acid (VITAMIN C) 500 MG tablet Take 500 mg by mouth daily.     aspirin EC 81 MG tablet Take 81 mg by mouth daily.     bumetanide (BUMEX) 1 MG tablet Take 1 mg by mouth daily.     Camphor-Menthol-Methyl Sal (SALONPAS) 3.10-16-08 % PTCH Place 1 patch onto the skin daily as needed (pain).     cetirizine (ZYRTEC) 10 MG tablet Take 10 mg by mouth daily.     Cholecalciferol (VITAMIN D3) 1.25 MG (50000 UT) CAPS Take 1 capsule by mouth once a week.     Cyanocobalamin (VITAMIN B-12) 2500 MCG SUBL Place 2,500 mcg under the tongue daily.     diclofenac sodium (VOLTAREN) 1 % GEL Apply 2 g topically 2 (two) times daily as needed  (pain).     doxazosin (CARDURA XL) 4 MG 24 hr tablet Take 4 mg by mouth at bedtime.     Exenatide ER (BYDUREON BCISE) 2 MG/0.85ML AUIJ Inject 2 mg into the skin once a week.     glimepiride (AMARYL) 4 MG tablet Take 4 mg by mouth daily with breakfast.      glucose blood test strip 1 each by Other route as needed for other. Use as instructed     Insulin Pen Needle (NOVOFINE) 32G X 6 MM MISC by Does not apply route.  losartan (COZAAR) 100 MG tablet Take 100 mg by mouth daily.     meclizine (ANTIVERT) 25 MG tablet Take 25 mg by mouth 2 (two) times daily as needed for dizziness.     metoprolol succinate (TOPROL-XL) 100 MG 24 hr tablet Take 100 mg by mouth daily with breakfast. Take with or immediately following a meal.      pantoprazole (PROTONIX) 40 MG tablet Take 40 mg by mouth daily before breakfast.     Polyethyl Glycol-Propyl Glycol (LUBRICANT EYE DROPS) 0.4-0.3 % SOLN Place 1 drop into both eyes 3 (three) times daily as needed (dry/irritated eyes.). Systane     polyethylene glycol (MIRALAX / GLYCOLAX) 17 g packet Take 17 g by mouth daily as needed (constipation.).     potassium chloride (K-DUR) 10 MEQ tablet Take 20 mEq by mouth daily.      rosuvastatin (CRESTOR) 10 MG tablet Take 10 mg by mouth at bedtime.      tiZANidine (ZANAFLEX) 4 MG tablet Take 4 mg by mouth every 8 (eight) hours as needed for muscle spasms.      TOUJEO SOLOSTAR 300 UNIT/ML SOPN Inject 45 Units into the skin at bedtime.     No current facility-administered medications for this visit.    Review of Systems  Constitutional: negative Eyes: negative Ears, nose, mouth, throat, and face: negative Respiratory: positive for cough Cardiovascular: negative Gastrointestinal: negative Genitourinary:negative Integument/breast: negative Hematologic/lymphatic: negative Musculoskeletal:negative Neurological: negative Behavioral/Psych: negative Endocrine: negative Allergic/Immunologic: negative  Physical  Exam  AOZ:HYQMV, healthy, no distress, well nourished, well developed, and anxious SKIN: skin color, texture, turgor are normal, no rashes or significant lesions HEAD: Normocephalic, No masses, lesions, tenderness or abnormalities EYES: normal, PERRLA, Conjunctiva are pink and non-injected EARS: External ears normal, Canals clear OROPHARYNX:no exudate, no erythema, and lips, buccal mucosa, and tongue normal  NECK: supple, no adenopathy, no JVD LYMPH:  no palpable lymphadenopathy, no hepatosplenomegaly BREAST:not examined LUNGS: clear to auscultation , and palpation HEART: regular rate & rhythm, no murmurs, and no gallops ABDOMEN:abdomen soft, non-tender, normal bowel sounds, and no masses or organomegaly BACK: Back symmetric, no curvature., No CVA tenderness EXTREMITIES:no joint deformities, effusion, or inflammation, no edema  NEURO: alert & oriented x 3 with fluent speech, no focal motor/sensory deficits  PERFORMANCE STATUS: ECOG 1  LABORATORY DATA: Lab Results  Component Value Date   WBC 4.4 05/25/2023   HGB 10.4 (L) 05/25/2023   HCT 32.1 (L) 05/25/2023   MCV 87.9 05/25/2023   PLT 161 05/25/2023      Chemistry      Component Value Date/Time   NA 142 05/25/2023 1047   K 3.8 05/25/2023 1047   CL 110 05/25/2023 1047   CO2 26 05/25/2023 1047   BUN 23 05/25/2023 1047   CREATININE 1.24 (H) 05/25/2023 1047      Component Value Date/Time   CALCIUM 9.8 05/25/2023 1047   ALKPHOS 53 05/25/2023 1047   AST 18 05/25/2023 1047   ALT 14 05/25/2023 1047   BILITOT 0.2 (L) 05/25/2023 1047       RADIOGRAPHIC STUDIES: NM PET Image Initial (PI) Skull Base To Thigh  Result Date: 05/13/2023 CLINICAL DATA:  Initial treatment strategy for pulmonary nodule. EXAM: NUCLEAR MEDICINE PET SKULL BASE TO THIGH TECHNIQUE: 9.7 mCi F-18 FDG was injected intravenously. Full-ring PET imaging was performed from the skull base to thigh after the radiotracer. CT data was obtained and used for  attenuation correction and anatomic localization. Fasting blood glucose: 120 mg/dl COMPARISON:  CT chest  03/25/2023. FINDINGS: Mediastinal blood pool activity: SUV max 2.5 Liver activity: SUV max NA NECK: Focal right vocal cord hypermetabolism without a CT correlate, possibly physiologic. Incidental CT findings: None. CHEST: Apical segment right upper lobe mass measures 3.2 x 4.0 cm (203/44), SUV max 8.9. No additional abnormal hypermetabolism. Incidental CT findings: Heart is enlarged.  No pericardial or pleural effusion. ABDOMEN/PELVIS: No abnormal hypermetabolism. Incidental CT findings: Liver and adrenal glands are unremarkable. Low-attenuation lesion in the right kidney. No specific follow-up necessary. Kidneys, spleen, pancreas, stomach and bowel are otherwise grossly unremarkable. SKELETON: No abnormal hypermetabolism. Incidental CT findings: Degenerative changes in the spine. IMPRESSION: Hypermetabolic apical segment right upper lobe mass, most compatible with T2a N0 M0 or stage IB primary bronchogenic carcinoma. Electronically Signed   By: Leanna Battles M.D.   On: 05/13/2023 15:18   DG C-ARM BRONCHOSCOPY  Result Date: 05/09/2023 C-ARM BRONCHOSCOPY: Fluoroscopy was utilized by the requesting physician.  No radiographic interpretation.   DG Chest Port 1 View  Result Date: 05/09/2023 CLINICAL DATA:  Status post bronchoscopic biopsy EXAM: PORTABLE CHEST - 1 VIEW COMPARISON:  03/22/2023 FINDINGS: Cardiomediastinal silhouette and pulmonary vasculature are within normal limits. Unchanged elevation of right hemidiaphragm. Right upper lobe mass again seen. No pneumothorax. Right axillary clips again noted. IMPRESSION: No pneumothorax status post bronchoscopic biopsy. Electronically Signed   By: Acquanetta Belling M.D.   On: 05/09/2023 13:55    ASSESSMENT: This is a very pleasant never smoker 73 years old African-American female with history of right breast cancer in 2005 status postlumpectomy followed by  radiotherapy. The patient is recently diagnosed with a stage Ib (t2a, N0, M0) non-small cell lung cancer, adenocarcinoma presented with right upper lobe lung mass with no evidence of metastatic disease on the PET scan.   PLAN: I had a lengthy discussion with the patient and her daughter today about her current disease stage, prognosis and treatment options. I personally and independently reviewed the imaging studies and discussed the result with the patient today. I recommended for the patient to complete the staging workup by ordering MRI of the brain to rule out brain metastasis. I will also arrange for the patient to have pulmonary function test. I will refer her to cardiothoracic surgery for evaluation and discussion of surgical resection.  We will send her tissue block for molecular studies after the resection to rule out any actionable mutation especially with her strong personal and family history of malignancy and he never smoker status. I will see the patient back for follow-up visit after her surgical evaluation. If she is not a surgical candidate, she could be considered for treatment with curative radiotherapy. The patient was advised to call immediately if she has any other concerning symptoms in the interval. The patient voices understanding of current disease status and treatment options and is in agreement with the current care plan.  All questions were answered. The patient knows to call the clinic with any problems, questions or concerns. We can certainly see the patient much sooner if necessary.  Thank you so much for allowing me to participate in the care of Carol Keith. I will continue to follow up the patient with you and assist in her care.  The total time spent in the appointment was 60 minutes.  Disclaimer: This note was dictated with voice recognition software. Similar sounding words can inadvertently be transcribed and may not be corrected upon  review.   Lajuana Matte May 25, 2023, 11:18 AM

## 2023-05-26 ENCOUNTER — Other Ambulatory Visit: Payer: Self-pay

## 2023-05-27 DIAGNOSIS — I10 Essential (primary) hypertension: Secondary | ICD-10-CM | POA: Diagnosis not present

## 2023-05-27 DIAGNOSIS — J329 Chronic sinusitis, unspecified: Secondary | ICD-10-CM | POA: Diagnosis not present

## 2023-05-27 DIAGNOSIS — E1122 Type 2 diabetes mellitus with diabetic chronic kidney disease: Secondary | ICD-10-CM | POA: Diagnosis not present

## 2023-05-27 DIAGNOSIS — C3491 Malignant neoplasm of unspecified part of right bronchus or lung: Secondary | ICD-10-CM | POA: Diagnosis not present

## 2023-05-27 DIAGNOSIS — R0981 Nasal congestion: Secondary | ICD-10-CM | POA: Diagnosis not present

## 2023-05-28 ENCOUNTER — Ambulatory Visit (HOSPITAL_COMMUNITY): Payer: Medicare Other

## 2023-05-28 ENCOUNTER — Ambulatory Visit (HOSPITAL_COMMUNITY)
Admission: RE | Admit: 2023-05-28 | Discharge: 2023-05-28 | Disposition: A | Discharge status: Home / Self Care | Payer: Medicare Other | Source: Ambulatory Visit | Attending: Internal Medicine | Admitting: Internal Medicine

## 2023-05-28 DIAGNOSIS — C3490 Malignant neoplasm of unspecified part of unspecified bronchus or lung: Secondary | ICD-10-CM

## 2023-05-28 DIAGNOSIS — J329 Chronic sinusitis, unspecified: Secondary | ICD-10-CM | POA: Diagnosis not present

## 2023-05-28 MED ORDER — GADOBUTROL 1 MMOL/ML IV SOLN
8.0000 mL | Freq: Once | INTRAVENOUS | Status: AC | PRN
  Administered 2023-05-28: 8 mL via INTRAVENOUS

## 2023-06-03 ENCOUNTER — Ambulatory Visit (HOSPITAL_COMMUNITY)
Admission: RE | Admit: 2023-06-03 | Discharge: 2023-06-03 | Disposition: A | Discharge status: Home / Self Care | Payer: Medicare Other | Source: Ambulatory Visit | Attending: Internal Medicine | Admitting: Internal Medicine

## 2023-06-03 DIAGNOSIS — R942 Abnormal results of pulmonary function studies: Secondary | ICD-10-CM | POA: Insufficient documentation

## 2023-06-03 DIAGNOSIS — C3411 Malignant neoplasm of upper lobe, right bronchus or lung: Secondary | ICD-10-CM | POA: Diagnosis not present

## 2023-06-03 DIAGNOSIS — J849 Interstitial pulmonary disease, unspecified: Secondary | ICD-10-CM | POA: Insufficient documentation

## 2023-06-03 DIAGNOSIS — C3490 Malignant neoplasm of unspecified part of unspecified bronchus or lung: Secondary | ICD-10-CM | POA: Diagnosis present

## 2023-06-03 LAB — PULMONARY FUNCTION TEST
DL/VA % pred: 90 %
DL/VA: 3.76 ml/min/mmHg/L
DLCO unc % pred: 75 %
DLCO unc: 14.06 ml/min/mmHg
FEF 25-75 Post: 2.38 L/sec
FEF 25-75 Pre: 2.24 L/sec
FEF2575-%Change-Post: 6 %
FEF2575-%Pred-Post: 143 %
FEF2575-%Pred-Pre: 135 %
FEV1-%Change-Post: 0 %
FEV1-%Pred-Post: 90 %
FEV1-%Pred-Pre: 90 %
FEV1-Post: 1.86 L
FEV1-Pre: 1.85 L
FEV1FVC-%Change-Post: 1 %
FEV1FVC-%Pred-Pre: 111 %
FEV6-%Change-Post: -1 %
FEV6-%Pred-Post: 84 %
FEV6-%Pred-Pre: 85 %
FEV6-Post: 2.18 L
FEV6-Pre: 2.21 L
FEV6FVC-%Pred-Post: 105 %
FEV6FVC-%Pred-Pre: 105 %
FVC-%Change-Post: -1 %
FVC-%Pred-Post: 80 %
FVC-%Pred-Pre: 81 %
FVC-Post: 2.18 L
FVC-Pre: 2.21 L
Post FEV1/FVC ratio: 85 %
Post FEV6/FVC ratio: 100 %
Pre FEV1/FVC ratio: 84 %
Pre FEV6/FVC Ratio: 100 %
RV % pred: 71 %
RV: 1.59 L
TLC % pred: 78 %
TLC: 3.87 L

## 2023-06-03 MED ORDER — ALBUTEROL SULFATE (2.5 MG/3ML) 0.083% IN NEBU
2.5000 mg | INHALATION_SOLUTION | Freq: Once | RESPIRATORY_TRACT | Status: AC
  Administered 2023-06-03: 2.5 mg via RESPIRATORY_TRACT

## 2023-06-11 DIAGNOSIS — I10 Essential (primary) hypertension: Secondary | ICD-10-CM | POA: Diagnosis not present

## 2023-06-14 ENCOUNTER — Telehealth: Payer: Self-pay

## 2023-06-14 NOTE — Telephone Encounter (Signed)
Call report received from [facility] concerning MRI of brain, resulted on 06/12/23.

## 2023-06-16 ENCOUNTER — Encounter: Payer: Self-pay | Admitting: Thoracic Surgery (Cardiothoracic Vascular Surgery)

## 2023-06-16 ENCOUNTER — Institutional Professional Consult (permissible substitution) (INDEPENDENT_AMBULATORY_CARE_PROVIDER_SITE_OTHER): Payer: Medicare Other | Admitting: Thoracic Surgery (Cardiothoracic Vascular Surgery)

## 2023-06-16 VITALS — BP 170/88 | HR 76 | Resp 20 | Ht 63.0 in | Wt 190.0 lb

## 2023-06-16 DIAGNOSIS — C3411 Malignant neoplasm of upper lobe, right bronchus or lung: Secondary | ICD-10-CM

## 2023-06-16 NOTE — Progress Notes (Signed)
PCP is Kirstie Peri, MD Referring Provider is Si Gaul, MD  Chief Complaint  Patient presents with   Lung Lesion    Surgical consult/ PET Scan 05/05/23/ Bronch 05/09/23/ PFT's 06/03/23/ MR Brain 05/28/23    HPI: Mrs. Livergood is sent for consultation regarding a newly diagnosed adenocarcinoma of the right upper lobe.  Azaria Kasai is a 72 year old woman with a history of hypertension, hyperlipidemia, type 2 diabetes, chronic kidney disease, arthritis, gout, and breast cancer, newly diagnosed adenocarcinoma of the right upper lobe.  She is a lifelong non-smoker.  She presented with a persistent cough.  She had COVID in 2022 and has had a cough ever since.  She finally mentioned it to her physician in a chest x-ray was ordered.  It showed a right upper lobe mass.  CT of the chest showed a 3.2 x 4 cm right upper lobe mass abutting and possibly invading the mediastinum.  There was no mediastinal or hilar adenopathy.  PET/CT showed the mass was hypermetabolic with an SUV of 8.9.  Again no evidence of regional or distant metastatic disease.  She recently had an MRI of the brain which showed a 2 mm focus of cortical enhancement indeterminate for metastasis versus vascular enhancement.  She underwent navigational bronchoscopy by Dr. Delton Coombes.  Biopsy showed adenocarcinoma consistent with a lung primary.  She is relatively limited in her activities.  She can walk about a quarter of a mile but would have to stop due to hip pain.  She denies chest pain, pressure, or tightness.  Persistent cough.  No wheezing.  Short of breath with heavy exertion.  No change in appetite or weight loss.  Chronic joint pain.  Zubrod Score: At the time of surgery this patient's most appropriate activity status/level should be described as: []     0    Normal activity, no symptoms [x]     1    Restricted in physical strenuous activity but ambulatory, able to do out light work []     2    Ambulatory and capable of self care,  unable to do work activities, up and about >50 % of waking hours                              []     3    Only limited self care, in bed greater than 50% of waking hours []     4    Completely disabled, no self care, confined to bed or chair []     5    Moribund   Past Medical History:  Diagnosis Date   Allergic rhinitis    Arthritis    kneees, back    Breast cancer (HCC)    Bursitis of right hip    Cancer (HCC)    breast cancer, right   Cerumen impaction    Chronic kidney disease    appt. schedule for Dr. 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   Dizziness    Fatigue    Frequent urination    Gout    Heart murmur    Echo11/21/16 ([facility]): LVEF 50-55%, reduced LV diastolic function, mild TR    Hypercholesteremia    Hypertension    Hypokalemia    Left foot pain    Left shoulder pain    Lumbago    Oral candida    Ovarian failure  Plantar fascial fibromatosis    PONV (postoperative nausea and vomiting)    Proteinuria    Right knee pain     Past Surgical History:  Procedure Laterality Date   ABDOMINAL HYSTERECTOMY     complete   APPENDECTOMY     BILIARY BRUSHING  02/07/2019   Procedure: BILIARY BRUSHING;  Surgeon: Lemar Lofty., MD;  Location: [facility] ENDOSCOPY;  Service: Gastroenterology;;   BILIARY BRUSHING  07/04/2019   Procedure: BILIARY BRUSHING;  Surgeon: Lemar Lofty., MD;  Location: [facility] ENDOSCOPY;  Service: Gastroenterology;;   BILIARY DILATION  02/07/2019   Procedure: BILIARY DILATION;  Surgeon: Lemar Lofty., MD;  Location: [facility] ENDOSCOPY;  Service: Gastroenterology;;   BILIARY DILATION  07/04/2019   Procedure: BILIARY DILATION;  Surgeon: Lemar Lofty., MD;  Location: [facility] ENDOSCOPY;  Service: Gastroenterology;;   BIOPSY  02/07/2019   Procedure: BIOPSY;  Surgeon: Lemar Lofty., MD;  Location: [facility]OSCOPY;  Service: Gastroenterology;;   BIOPSY   07/04/2019   Procedure: BIOPSY;  Surgeon: Lemar Lofty., MD;  Location: [facility] ENDOSCOPY;  Service: Gastroenterology;;   BREAST LUMPECTOMY Right 2015   BREAST SURGERY Right    for Breast Ca, tx with surg. & chemo & radiation     BRONCHIAL BIOPSY  05/09/2023   Procedure: BRONCHIAL BIOPSIES;  Surgeon: Leslye Peer, MD;  Location: [facility] ENDOSCOPY;  Service: Pulmonary;;   BRONCHIAL BRUSHINGS  05/09/2023   Procedure: BRONCHIAL BRUSHINGS;  Surgeon: Leslye Peer, MD;  Location: [facility] ENDOSCOPY;  Service: Pulmonary;;   BRONCHIAL NEEDLE ASPIRATION BIOPSY  05/09/2023   Procedure: BRONCHIAL NEEDLE ASPIRATION BIOPSIES;  Surgeon: Leslye Peer, MD;  Location: [facility] ENDOSCOPY;  Service: Pulmonary;;   CATARACT EXTRACTION W/PHACO Left 08/06/2019   Procedure: CATARACT EXTRACTION PHACO AND INTRAOCULAR LENS PLACEMENT LEFT EYE;  Surgeon: Fabio Pierce, MD;  Location: [facility] ORS;  Service: Ophthalmology;  Laterality: Left;  CDE: 9.01   CATARACT EXTRACTION W/PHACO Right 08/20/2019   Procedure: CATARACT EXTRACTION PHACO AND INTRAOCULAR LENS PLACEMENT RIGHT EYE CDE=5.88;  Surgeon: Fabio Pierce, MD;  Location: [facility] ORS;  Service: Ophthalmology;  Laterality: Right;  right   CHOLECYSTECTOMY     COLONOSCOPY     ENDOSCOPIC RETROGRADE CHOLANGIOPANCREATOGRAPHY (ERCP) WITH PROPOFOL N/A 07/04/2019   Procedure: ENDOSCOPIC RETROGRADE CHOLANGIOPANCREATOGRAPHY (ERCP) WITH PROPOFOL;  Surgeon: Meridee Score Netty Starring., MD;  Location: [facility] ENDOSCOPY;  Service: Gastroenterology;  Laterality: N/A;   ERCP N/A 02/07/2019   Procedure: ENDOSCOPIC RETROGRADE CHOLANGIOPANCREATOGRAPHY (ERCP);  Surgeon: Lemar Lofty., MD;  Location: [facility] ENDOSCOPY;  Service: Gastroenterology;  Laterality: N/A;   ESOPHAGOGASTRODUODENOSCOPY (EGD) WITH PROPOFOL N/A 02/07/2019   Procedure: ESOPHAGOGASTRODUODENOSCOPY (EGD) WITH PROPOFOL;  Surgeon: Meridee Score Netty Starring., MD;  Location: [facility] ENDOSCOPY;  Service: Gastroenterology;  Laterality: N/A;    ESOPHAGOGASTRODUODENOSCOPY (EGD) WITH PROPOFOL N/A 07/04/2019   Procedure: ESOPHAGOGASTRODUODENOSCOPY (EGD) WITH PROPOFOL;  Surgeon: Meridee Score Netty Starring., MD;  Location: [facility] ENDOSCOPY;  Service: Gastroenterology;  Laterality: N/A;   EUS N/A 07/04/2019   Procedure: UPPER ENDOSCOPIC ULTRASOUND (EUS) RADIAL;  Surgeon: Lemar Lofty., MD;  Location: [facility] ENDOSCOPY;  Service: Gastroenterology;  Laterality: N/A;   JOINT REPLACEMENT Bilateral    knee   KNEE ARTHROPLASTY Right 05/17/2016   Procedure: RIGHT TOTAL KNEE ARTHROPLASTY WITH COMPUTER NAVIGATION;  Surgeon: Samson Frederic, MD;  Location: [facility] OR;  Service: Orthopedics;  Laterality: Right;  Needs RNFA   KNEE ARTHROPLASTY Left 01/24/2017   Procedure: LEFT TOTAL KNEE ARTHROPLASTY WITH COMPUTER NAVIGATION;  Surgeon: Samson Frederic, MD;  Location: [facility] OR;  Service: Orthopedics;  Laterality: Left;  Dr. requesting RNFA   LAPAROSCOPY  1999   done at [facility], "something with separating my organs because things had grown together"   REMOVAL OF STONES  02/07/2019   Procedure: REMOVAL OF STONES;  Surgeon: Lemar Lofty., MD;  Location: [facility] ENDOSCOPY;  Service: Gastroenterology;;   Dennison Mascot  02/07/2019   Procedure: Dennison Mascot;  Surgeon: Lemar Lofty., MD;  Location: [facility] ENDOSCOPY;  Service: Gastroenterology;;   TONSILLECTOMY     TUBAL LIGATION     UPPER ESOPHAGEAL ENDOSCOPIC ULTRASOUND (EUS)  02/07/2019   Procedure: UPPER ESOPHAGEAL ENDOSCOPIC ULTRASOUND (EUS);  Surgeon: Meridee Score Netty Starring., MD;  Location: [facility] ENDOSCOPY;  Service: Gastroenterology;;   VIDEO BRONCHOSCOPY WITH RADIAL ENDOBRONCHIAL ULTRASOUND  05/09/2023   Procedure: VIDEO BRONCHOSCOPY WITH RADIAL ENDOBRONCHIAL ULTRASOUND;  Surgeon: Leslye Peer, MD;  Location: [facility] ENDOSCOPY;  Service: Pulmonary;;    Family History  Problem Relation Age of Onset   Breast cancer Sister        age 58   BRCA 1/2 Daughter    Breast cancer Daughter     Social  History Social History   Tobacco Use   Smoking status: Never   Smokeless tobacco: Never  Vaping Use   Vaping status: Never Used  Substance Use Topics   Alcohol use: No   Drug use: No    Current Outpatient Medications  Medication Sig Dispense Refill   allopurinol (ZYLOPRIM) 100 MG tablet Take 100 mg by mouth daily.      amLODipine (NORVASC) 5 MG tablet Take 5 mg by mouth daily.     ascorbic acid (VITAMIN C) 500 MG tablet Take 500 mg by mouth daily.     aspirin EC 81 MG tablet Take 81 mg by mouth daily.     bumetanide (BUMEX) 1 MG tablet Take 1 mg by mouth daily.     Camphor-Menthol-Methyl Sal (SALONPAS) 3.10-16-08 % PTCH Place 1 patch onto the skin daily as needed (pain).     cetirizine (ZYRTEC) 10 MG tablet Take 10 mg by mouth daily.     Cholecalciferol (VITAMIN D3) 1.25 MG (50000 UT) CAPS Take 1 capsule by mouth once a week.     Cyanocobalamin (VITAMIN B-12) 2500 MCG SUBL Place 2,500 mcg under the tongue daily.     diclofenac sodium (VOLTAREN) 1 % GEL Apply 2 g topically 2 (two) times daily as needed (pain).     doxazosin (CARDURA XL) 4 MG 24 hr tablet Take 4 mg by mouth at bedtime.     Exenatide ER (BYDUREON BCISE) 2 MG/0.85ML AUIJ Inject 2 mg into the skin once a week.     glimepiride (AMARYL) 4 MG tablet Take 4 mg by mouth daily with breakfast.      glucose blood test strip 1 each by Other route as needed for other. Use as instructed     Insulin Pen Needle (NOVOFINE) 32G X 6 MM MISC by Does not apply route.     losartan (COZAAR) 100 MG tablet Take 100 mg by mouth daily.     meclizine (ANTIVERT) 25 MG tablet Take 25 mg by mouth 2 (two) times daily as needed for dizziness.     metoprolol succinate (TOPROL-XL) 100 MG 24 hr tablet Take 100 mg by mouth daily with breakfast. Take with or immediately following a meal.      pantoprazole (PROTONIX) 40 MG tablet Take 40 mg by mouth daily before breakfast.     Polyethyl Glycol-Propyl Glycol (LUBRICANT EYE DROPS) 0.4-0.3 % SOLN Place  1 drop  into both eyes 3 (three) times daily as needed (dry/irritated eyes.). Systane     polyethylene glycol (MIRALAX / GLYCOLAX) 17 g packet Take 17 g by mouth daily as needed (constipation.).     potassium chloride (K-DUR) 10 MEQ tablet Take 20 mEq by mouth daily.      rosuvastatin (CRESTOR) 10 MG tablet Take 10 mg by mouth at bedtime.      tiZANidine (ZANAFLEX) 4 MG tablet Take 4 mg by mouth every 8 (eight) hours as needed for muscle spasms.      TOUJEO SOLOSTAR 300 UNIT/ML SOPN Inject 45 Units into the skin at bedtime.     No current facility-administered medications for this visit.    Allergies  Allergen Reactions   Ace Inhibitors     Cough    Actos [Pioglitazone]     Edema    Lipitor [Atorvastatin]     Chest tightness    Metformin And Related Diarrhea    Review of Systems  Constitutional:  Positive for fatigue. Negative for activity change, appetite change and unexpected weight change.  HENT:  Positive for dental problem (Dentures). Negative for trouble swallowing and voice change.   Eyes:  Negative for visual disturbance.  Respiratory:  Positive for cough. Negative for wheezing.   Cardiovascular:  Negative for chest pain, palpitations and leg swelling.  Gastrointestinal:  Negative for abdominal distention and abdominal pain.  Genitourinary:  Negative for difficulty urinating.  Musculoskeletal:  Positive for arthralgias, gait problem and myalgias.  Neurological:  Negative for seizures and weakness.  Hematological:  Negative for adenopathy. Does not bruise/bleed easily.  Psychiatric/Behavioral:  Negative for dysphoric mood. The patient is not nervous/anxious.   All other systems reviewed and are negative.   BP (!) 170/88   Pulse 76   Resp 20   Ht 5\' 3"  (1.6 m)   Wt 190 lb (86.2 kg)   SpO2 97% Comment: RA  BMI 33.66 kg/m  Physical Exam Vitals reviewed.  Constitutional:      Appearance: Normal appearance.  HENT:     Head: Normocephalic and atraumatic.  Eyes:      General: No scleral icterus.    Extraocular Movements: Extraocular movements intact.  Cardiovascular:     Rate and Rhythm: Normal rate and regular rhythm.     Heart sounds: Murmur (2/6 systolic RUSB) heard.     No gallop.  Pulmonary:     Effort: Pulmonary effort is normal. No respiratory distress.     Breath sounds: Normal breath sounds. No wheezing or rales.  Abdominal:     General: Bowel sounds are normal. There is no distension.     Palpations: Abdomen is soft.     Tenderness: There is no abdominal tenderness.  Skin:    General: Skin is warm and dry.  Neurological:     General: No focal deficit present.     Mental Status: She is alert and oriented to person, place, and time.     Cranial Nerves: No cranial nerve deficit.     Motor: No weakness.    Diagnostic Tests: NUCLEAR MEDICINE PET SKULL BASE TO THIGH   TECHNIQUE: 9.7 mCi F-18 FDG was injected intravenously. Full-ring PET imaging was performed from the skull base to thigh after the radiotracer. CT data was obtained and used for attenuation correction and anatomic localization.   Fasting blood glucose: 120 mg/dl   COMPARISON:  CT chest 03/25/2023.   FINDINGS: Mediastinal blood pool activity: SUV max 2.5   Liver  activity: SUV max NA   NECK:   Focal right vocal cord hypermetabolism without a CT correlate, possibly physiologic.   Incidental CT findings:   None.   CHEST:   Apical segment right upper lobe mass measures 3.2 x 4.0 cm (203/44), SUV max 8.9. No additional abnormal hypermetabolism.   Incidental CT findings:   Heart is enlarged.  No pericardial or pleural effusion.   ABDOMEN/PELVIS:   No abnormal hypermetabolism.   Incidental CT findings:   Liver and adrenal glands are unremarkable. Low-attenuation lesion in the right kidney. No specific follow-up necessary. Kidneys, spleen, pancreas, stomach and bowel are otherwise grossly unremarkable.   SKELETON:   No abnormal hypermetabolism.    Incidental CT findings:   Degenerative changes in the spine.   IMPRESSION: Hypermetabolic apical segment right upper lobe mass, most compatible with T2a N0 M0 or stage IB primary bronchogenic carcinoma.     Electronically Signed   By: Leanna Battles M.D.   On: 05/13/2023 15:18 I personally reviewed the CT images.  There is a 3.2 x 4 cm right upper lobe mass abutting and possibly invading the mediastinum.  No hilar or mediastinal adenopathy.  No evidence of distant metastases.  Impression: Carol Keith is a 72 year old woman with a history of hypertension, hyperlipidemia, type 2 diabetes, chronic kidney disease, arthritis, gout, and breast cancer, newly diagnosed adenocarcinoma of the right upper lobe.  She presented with a persistent cough.  Workup revealed a right upper lobe mass.  Hypermetabolic on PET.  Biopsy showed adenocarcinoma consistent with a lung primary.  Adenocarcinoma right upper lobe-clinical stage Ib (T2a, N0) versus IIIA (T4, N0) versus IV (M1B disease).  Regarding the finding on the MRI of the brain.  While that could be metastatic I would give her the benefit of the doubt related to that.  Even if it does turn out to be a metastasis, it would be oligometastatic disease and could be treated with either surgery or radiation.  The a key question regarding the right upper lobe mass is whether or not there is mediastinal invasion.  I am a little concerned about the finding of activity in the right vocal cord as that could indicate issues with the recurrent nerve on that side.  She does not have any hoarseness to suggest recurrent nerve involvement.  Therefore, I would be willing to give her the benefit of the doubt, and consider surgical resection as that gives her the best chance of cure.  I had a long discussion with Mrs. Natarajan and her family.  We discussed the options of surgery versus radiation.  I made it clear that surgery gives her a far better chance of a cure,  albeit no guarantee.  They had questions specifically related to radiation for the tumor as she had previously been told that mass was not resectable.  I would defer that to a radiation oncologist given the complicated location of the mass.  I recommended that we proceed with a robotic right VATS for right upper lobectomy.  They understand that if we find significant mediastinal invasion that we may not be able to resect the tumor.  In that case she would be referred for radiation and chemotherapy.  Chemotherapy likely to be part of her treatment regardless given the size of the mass.  I described the proposed operative procedure to her and her family.  They understand the need for general anesthesia, the incisions to be used, the use of the surgical robot, possible need for conversion  to an open procedure, the use of a drainage tube postoperatively, the expected hospital stay, and the overall recovery.  I informed him of the indications, risks, benefits, and alternatives.  They understand the risks include, but not limited to death, MI, DVT, PE, bleeding, possible need for transfusion, infection, prolonged air leak, cardiac arrhythmias, as well as the possibility of other unforeseeable complications including chronic pain.  She is undecided.  She wants to talk to radiation oncology before making a decision.  I will arrange a consultation.  She has adequate pulmonary function to tolerate a resection.  She would need to hold her exenatide ER for a week prior to surgery  Plan: Referral to radiation oncology She will call to let us know how she would like to proceed.  I spent over 45 minutes in review of records, images, and in consultation with Mrs. Michelli today. Loreli Slot, MD [facility] [phone]

## 2023-06-17 ENCOUNTER — Telehealth: Payer: Self-pay | Admitting: Radiation Oncology

## 2023-06-17 NOTE — Progress Notes (Signed)
Thoracic Location of Tumor / Histology: right upper lobe  Patient presented with symptoms of: chronic cough   Biopsies revealed:  05/09/2023  FINAL MICROSCOPIC DIAGNOSIS:  A.  RIGHT LUNG, UPPER LOBE, MASS, FINE NEEDLE ASPIRATION:  - Malignant  - Adenocarcinoma consistent with lung primary   B.  RIGHT LUNG, UPPER LOBE, MASS, BRUSH:  - Malignant  - Adenocarcinoma consistent with lung primary   COMMENT:   Given the patient's remote history of breast cancer, two  munohistochemical stains are performed with adequate control to further  elucidate the histogenesis of this tumor.  The tumor is positive for the  pulmonary adeno marker TTF-1.  The tumor is negative for the breast  marker GATA3.  This immunohistochemical profile supports the above  diagnosis.   Case is reviewed by Dr. Karma Ganja who concurs with the interpretation.  Dr. Delton Coombes notified of diagnosis by Dr. Venetia Night via epic messaging on  05/11/2023.   Tobacco/Marijuana/Snuff/ETOH use:  She has no history of smoking, alcohol or drug abuse.  Past/Anticipated interventions by cardiothoracic surgery, if any:  Video Bronchoscopy with Robotic Assisted Bronchoscopic Navigation    Date of Operation: 05/09/2023    Pre-op Diagnosis: Right upper lobe mass   Post-op Diagnosis: Same   Surgeon: Levy Pupa  Past/Anticipated interventions by medical oncology, if any:  Si Gaul, MD 05/25/2023  HPI Carol Keith is a 72 y.o. female is a never smoker with past medical history significant for osteoarthritis, history of right breast cancer in 2005 for s/p lumpectomy followed by adjuvant radiotherapy, bursitis of the right hip, chronic kidney disease, diabetes mellitus, heart murmur, hypertension, dyslipidemia, left shoulder pain, and gout.  She has been complaining of chronic cough for around 6 months.  She had chest x-ray performed at [facility] and that showed interval increased density projecting over the medial aspect  of the right clavicle on the frontal examination suspicious for a pulmonary nodule or pleural-based lesion.  There was no evidence of pneumonia or other acute process.  This was followed by CT scan of the chest on 03/25/2023 at [facility] and it showed apical segment right upper lobe mass measuring 2.3 x 3.8 cm and corresponds to the abnormality on the recent chest x-ray.  It has approximately 2.6 cm contact with the adjacent mediastinum, increasing the likelihood of microinvasion.  The patient was then referred to Dr. Delton Coombes and on 05/09/2023 she underwent video bronchoscopy with robotic assisted bronchoscopic navigation.  The final pathology from the fine-needle aspiration as well as the brushing of the right upper lobe mass ([id number]) was consistent with adenocarcinoma with lung primary. Given the patient's remote history of breast cancer, two imunohistochemical stains are performed with adequate control to further  elucidate the histogenesis of this tumor.  The tumor is positive for the pulmonary adeno marker TTF-1.  The tumor is negative for the breast marker GATA3.  This immunohistochemical profile supports the above diagnosis.  The patient had a PET scan on May 01, 2023 and that showed apical segment right upper lobe mass measuring 3.2 x 4.0 cm with SUV max of 8.9.  There was no evidence of distant metastatic disease and no other abnormality in the chest. Dr. Delton Coombes kindly referred the patient to me today for evaluation and recommendation regarding treatment of her condition. When seen today the patient is feeling fine except for mild cough from recent cold symptoms.  She denied having any chest pain, shortness of breath or hemoptysis.  She has no weight loss or night  sweats.  She has no nausea, vomiting, diarrhea or constipation.  She has mild headache with no visual changes. Family history significant for mother with a stroke and diabetes.  Father had brain cancer.  Sister had breast cancer.   Daughter had breast cancer with BRCA mutation. The patient is married and has 2 daughters.  She was accompanied today by her daughter Sheralyn Boatman.  She used to work in Geophysicist/field seismologist.  She has no history of smoking, alcohol or drug abuse.   ASSESSMENT: This is a very pleasant never smoker 72 years old African-American female with history of right breast cancer in 2005 status postlumpectomy followed by radiotherapy. The patient is recently diagnosed with a stage Ib (t2a, N0, M0) non-small cell lung cancer, adenocarcinoma presented with right upper lobe lung mass with no evidence of metastatic disease on the PET scan.     PLAN: I had a lengthy discussion with the patient and her daughter today about her current disease stage, prognosis and treatment options. I personally and independently reviewed the imaging studies and discussed the result with the patient today. I recommended for the patient to complete the staging workup by ordering MRI of the brain to rule out brain metastasis. I will also arrange for the patient to have pulmonary function test. I will refer her to cardiothoracic surgery for evaluation and discussion of surgical resection.  We will send her tissue block for molecular studies after the resection to rule out any actionable mutation especially with her strong personal and family history of malignancy and he never smoker status. I will see the patient back for follow-up visit after her surgical evaluation. If she is not a surgical candidate, she could be considered for treatment with curative radiotherapy. The patient was advised to call immediately if she has any other concerning symptoms in the interval. The patient voices understanding of current disease status and treatment options and is in agreement with the current care plan. Signs/Symptoms Weight changes, if any: {:18581} Respiratory complaints, if any: {:18581} Hemoptysis, if any: {:18581} Pain issues, if any:  {:18581}  SAFETY  ISSUES: Prior radiation? {:18581} Pacemaker/ICD? {:18581}  Possible current pregnancy?no Is the patient on methotrexate? no  Current Complaints / other details:  ***

## 2023-06-17 NOTE — Telephone Encounter (Signed)
Called patient to schedule a consultation w. Dr. Sondra Come. Patient stated she would give [place] a call back to schedule.

## 2023-06-23 ENCOUNTER — Ambulatory Visit
Admission: RE | Admit: 2023-06-23 | Discharge: 2023-06-23 | Disposition: A | Discharge status: Home / Self Care | Payer: Medicare Other | Source: Ambulatory Visit | Attending: Radiation Oncology | Admitting: Radiation Oncology

## 2023-06-23 ENCOUNTER — Encounter: Payer: Self-pay | Admitting: Radiation Oncology

## 2023-06-23 VITALS — BP 178/82 | HR 80 | Temp 97.1°F | Resp 18 | Ht 63.0 in | Wt 194.2 lb

## 2023-06-23 DIAGNOSIS — M129 Arthropathy, unspecified: Secondary | ICD-10-CM | POA: Diagnosis not present

## 2023-06-23 DIAGNOSIS — Z923 Personal history of irradiation: Secondary | ICD-10-CM | POA: Diagnosis not present

## 2023-06-23 DIAGNOSIS — R011 Cardiac murmur, unspecified: Secondary | ICD-10-CM | POA: Diagnosis not present

## 2023-06-23 DIAGNOSIS — E7800 Pure hypercholesterolemia, unspecified: Secondary | ICD-10-CM | POA: Insufficient documentation

## 2023-06-23 DIAGNOSIS — Z7985 Long-term (current) use of injectable non-insulin antidiabetic drugs: Secondary | ICD-10-CM | POA: Diagnosis not present

## 2023-06-23 DIAGNOSIS — I10 Essential (primary) hypertension: Secondary | ICD-10-CM | POA: Diagnosis not present

## 2023-06-23 DIAGNOSIS — Z853 Personal history of malignant neoplasm of breast: Secondary | ICD-10-CM | POA: Insufficient documentation

## 2023-06-23 DIAGNOSIS — N189 Chronic kidney disease, unspecified: Secondary | ICD-10-CM | POA: Diagnosis not present

## 2023-06-23 DIAGNOSIS — Z7984 Long term (current) use of oral hypoglycemic drugs: Secondary | ICD-10-CM | POA: Diagnosis not present

## 2023-06-23 DIAGNOSIS — M109 Gout, unspecified: Secondary | ICD-10-CM | POA: Diagnosis not present

## 2023-06-23 DIAGNOSIS — C3411 Malignant neoplasm of upper lobe, right bronchus or lung: Secondary | ICD-10-CM | POA: Insufficient documentation

## 2023-06-23 DIAGNOSIS — Z803 Family history of malignant neoplasm of breast: Secondary | ICD-10-CM | POA: Insufficient documentation

## 2023-06-23 DIAGNOSIS — Z794 Long term (current) use of insulin: Secondary | ICD-10-CM | POA: Insufficient documentation

## 2023-06-23 DIAGNOSIS — E119 Type 2 diabetes mellitus without complications: Secondary | ICD-10-CM | POA: Diagnosis not present

## 2023-06-23 DIAGNOSIS — Z79899 Other long term (current) drug therapy: Secondary | ICD-10-CM | POA: Insufficient documentation

## 2023-06-23 DIAGNOSIS — E876 Hypokalemia: Secondary | ICD-10-CM | POA: Diagnosis not present

## 2023-06-23 DIAGNOSIS — Z7982 Long term (current) use of aspirin: Secondary | ICD-10-CM | POA: Diagnosis not present

## 2023-06-23 DIAGNOSIS — I6782 Cerebral ischemia: Secondary | ICD-10-CM | POA: Insufficient documentation

## 2023-06-23 DIAGNOSIS — M47812 Spondylosis without myelopathy or radiculopathy, cervical region: Secondary | ICD-10-CM | POA: Insufficient documentation

## 2023-06-23 NOTE — Progress Notes (Signed)
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 A diagnosis of Adenocarcinoma of upper lobe of right lung [facility]) was also pertinent to this visit.   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 female who is accompanied by her supportive husband and daughter. She is seen as a courtesy of Dr. Dorris Fetch for an opinion concerning radiation therapy as part of management for her recently diagnosed right lung cancer.   The patient presented to medical attention this past June with c/o of nonproductive cough x 6 months. She subsequently presented for a chest x-ray on 03/22/23 which showed an indeterminate increasing density in the medial aspect of the right clavicle. For further evaluation of this finding, a chest CT was performed on 03/25/23 which demonstrated a suspicious mass in the apical segment of the RUL measuring approximately 2.3 x 3.8 cm, corresponding to the abnormality noted on her chest x-ray. The mass also appeared to contact the adjacent mediastinum, raising concern for microinvasive disease.   PET scan on 05/05/23 redemonstrated hypermetabolism associated with the apical segment right upper lobe mass, most compatible with T2a N0 M0 or stage IB primary bronchogenic carcinoma. A focal right vocal cord hypermetabolism without a CT correlate was also appreciated, possibly physiologic in  etiology.  Subsequently, the patient was referred to pulmonary medicine and opted to proceed with bronchoscopy with biopsies of the RUL on 05/09/23 under Dr. Delton Coombes. Pathology revealed findings consistent with adenocarcinoma from a lung primary.   Accordingly, the patient was referred to Dr. Arbutus Ped on 05/25/23 to discuss treatment options. During that visit, the patient denied any symptoms or concerns other than a mild cough and recent cold symptoms. Prior to constructing a definitve treatment plan, Dr Arbutus Ped recommended proceeding with further staging work-up, detailed as follows:  -- MRI of the brain on 05/28/23 showed a 2 mm focus of cortical enhancement within the right frontoparietal operculum, indeterminate for a metastasis versus vascular enhancement.  For consideration of surgical management, Dr. Arbutus Ped referred the patient to Dr. Dorris Fetch on 06/16/23. Dr. Dorris Fetch has advised the patient to consider a right upper lobectomy to achieve the best chance of cure.   She remains undecided about surgery and is interested in learning more about radiation therapy which we will discuss in detail today.   PREVIOUS RADIATION THERAPY: Yes   History of right breast cancer in 2005  s/p lumpectomy followed by adjuvant radiation therapy.  She reports receiving radiation therapy to the right breast only.  She was treated at the [facility] cancer center in [place].  Reports tolerating radiation therapy well.  PAST MEDICAL HISTORY:  Past Medical History:  Diagnosis Date   Allergic rhinitis    Arthritis    kneees, back    Breast cancer (HCC)    Bursitis of right hip    Cancer (HCC)    breast cancer, right   Cerumen impaction    Chronic kidney disease  appt. schedule for Dr. 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   Dizziness    Fatigue    Frequent urination    Gout    Heart murmur    Echo11/21/16 ([facility]): LVEF  50-55%, reduced LV diastolic function, mild TR    Hypercholesteremia    Hypertension    Hypokalemia    Left foot pain    Left shoulder pain    Lumbago    Oral candida    Ovarian failure    Plantar fascial fibromatosis    PONV (postoperative nausea and vomiting)    Proteinuria    Right knee pain     PAST SURGICAL HISTORY: Past Surgical History:  Procedure Laterality Date   ABDOMINAL HYSTERECTOMY     complete   APPENDECTOMY     BILIARY BRUSHING  02/07/2019   Procedure: BILIARY BRUSHING;  Surgeon: Lemar Lofty., MD;  Location: [facility] ENDOSCOPY;  Service: Gastroenterology;;   BILIARY BRUSHING  07/04/2019   Procedure: BILIARY BRUSHING;  Surgeon: Lemar Lofty., MD;  Location: [facility] ENDOSCOPY;  Service: Gastroenterology;;   BILIARY DILATION  02/07/2019   Procedure: BILIARY DILATION;  Surgeon: Lemar Lofty., MD;  Location: [facility] ENDOSCOPY;  Service: Gastroenterology;;   BILIARY DILATION  07/04/2019   Procedure: BILIARY DILATION;  Surgeon: Lemar Lofty., MD;  Location: [facility] ENDOSCOPY;  Service: Gastroenterology;;   BIOPSY  02/07/2019   Procedure: BIOPSY;  Surgeon: Lemar Lofty., MD;  Location: [facility] ENDOSCOPY;  Service: Gastroenterology;;   BIOPSY  07/04/2019   Procedure: BIOPSY;  Surgeon: Lemar Lofty., MD;  Location: [facility] ENDOSCOPY;  Service: Gastroenterology;;   BREAST LUMPECTOMY Right 2015   BREAST SURGERY Right    for Breast Ca, tx with surg. & chemo & radiation     BRONCHIAL BIOPSY  05/09/2023   Procedure: BRONCHIAL BIOPSIES;  Surgeon: Leslye Peer, MD;  Location: [facility] ENDOSCOPY;  Service: Pulmonary;;   BRONCHIAL BRUSHINGS  05/09/2023   Procedure: BRONCHIAL BRUSHINGS;  Surgeon: Leslye Peer, MD;  Location: [facility] ENDOSCOPY;  Service: Pulmonary;;   BRONCHIAL NEEDLE ASPIRATION BIOPSY  05/09/2023   Procedure: BRONCHIAL NEEDLE ASPIRATION BIOPSIES;  Surgeon: Leslye Peer, MD;  Location: [facility] ENDOSCOPY;  Service: Pulmonary;;   CATARACT  EXTRACTION W/PHACO Left 08/06/2019   Procedure: CATARACT EXTRACTION PHACO AND INTRAOCULAR LENS PLACEMENT LEFT EYE;  Surgeon: Fabio Pierce, MD;  Location: [facility] ORS;  Service: Ophthalmology;  Laterality: Left;  CDE: 9.01   CATARACT EXTRACTION W/PHACO Right 08/20/2019   Procedure: CATARACT EXTRACTION PHACO AND INTRAOCULAR LENS PLACEMENT RIGHT EYE CDE=5.88;  Surgeon: Fabio Pierce, MD;  Location: [facility] ORS;  Service: Ophthalmology;  Laterality: Right;  right   CHOLECYSTECTOMY     COLONOSCOPY     ENDOSCOPIC RETROGRADE CHOLANGIOPANCREATOGRAPHY (ERCP) WITH PROPOFOL N/A 07/04/2019   Procedure: ENDOSCOPIC RETROGRADE CHOLANGIOPANCREATOGRAPHY (ERCP) WITH PROPOFOL;  Surgeon: Meridee Score Netty Starring., MD;  Location: [facility] ENDOSCOPY;  Service: Gastroenterology;  Laterality: N/A;   ERCP N/A 02/07/2019   Procedure: ENDOSCOPIC RETROGRADE CHOLANGIOPANCREATOGRAPHY (ERCP);  Surgeon: Lemar Lofty., MD;  Location: [facility] ENDOSCOPY;  Service: Gastroenterology;  Laterality: N/A;   ESOPHAGOGASTRODUODENOSCOPY (EGD) WITH PROPOFOL N/A 02/07/2019   Procedure: ESOPHAGOGASTRODUODENOSCOPY (EGD) WITH PROPOFOL;  Surgeon: Meridee Score Netty Starring., MD;  Location: [facility] ENDOSCOPY;  Service: Gastroenterology;  Laterality: N/A;   ESOPHAGOGASTRODUODENOSCOPY (EGD) WITH PROPOFOL N/A 07/04/2019   Procedure: ESOPHAGOGASTRODUODENOSCOPY (EGD) WITH PROPOFOL;  Surgeon: Meridee Score Netty Starring., MD;  Location: [facility] ENDOSCOPY;  Service: Gastroenterology;  Laterality: N/A;   EUS N/A 07/04/2019   Procedure: UPPER ENDOSCOPIC ULTRASOUND (EUS) RADIAL;  Surgeon: Lemar Lofty., MD;  Location: [facility] ENDOSCOPY;  Service: Gastroenterology;  Laterality: N/A;   JOINT REPLACEMENT Bilateral    knee   KNEE ARTHROPLASTY Right 05/17/2016   Procedure: RIGHT TOTAL KNEE ARTHROPLASTY WITH COMPUTER NAVIGATION;  Surgeon: Samson Frederic, MD;  Location: [facility] OR;  Service: Orthopedics;  Laterality: Right;  Needs RNFA   KNEE ARTHROPLASTY Left 01/24/2017   Procedure: LEFT  TOTAL KNEE ARTHROPLASTY WITH COMPUTER NAVIGATION;  Surgeon: Samson Frederic, MD;  Location: [facility] OR;  Service: Orthopedics;  Laterality: Left;  Dr. requesting RNFA   LAPAROSCOPY  1999   done at [facility], "something with separating my organs because things had grown together"   REMOVAL OF STONES  02/07/2019   Procedure: REMOVAL OF STONES;  Surgeon: Lemar Lofty., MD;  Location: [facility] ENDOSCOPY;  Service: Gastroenterology;;   Dennison Mascot  02/07/2019   Procedure: Dennison Mascot;  Surgeon: Lemar Lofty., MD;  Location: [facility] ENDOSCOPY;  Service: Gastroenterology;;   TONSILLECTOMY     TUBAL LIGATION     UPPER ESOPHAGEAL ENDOSCOPIC ULTRASOUND (EUS)  02/07/2019   Procedure: UPPER ESOPHAGEAL ENDOSCOPIC ULTRASOUND (EUS);  Surgeon: Meridee Score Netty Starring., MD;  Location: [facility] ENDOSCOPY;  Service: Gastroenterology;;   VIDEO BRONCHOSCOPY WITH RADIAL ENDOBRONCHIAL ULTRASOUND  05/09/2023   Procedure: VIDEO BRONCHOSCOPY WITH RADIAL ENDOBRONCHIAL ULTRASOUND;  Surgeon: Leslye Peer, MD;  Location: [facility] ENDOSCOPY;  Service: Pulmonary;;    FAMILY HISTORY:  Family History  Problem Relation Age of Onset   Breast cancer Sister        age 50   BRCA 1/2 Daughter    Breast cancer Daughter     SOCIAL HISTORY:  Social History   Tobacco Use   Smoking status: Never   Smokeless tobacco: Never  Vaping Use   Vaping status: Never Used  Substance Use Topics   Alcohol use: No   Drug use: No    ALLERGIES:  Allergies  Allergen Reactions   Ace Inhibitors     Cough    Actos [Pioglitazone]     Edema    Lipitor [Atorvastatin]     Chest tightness    Metformin And Related Diarrhea    MEDICATIONS:  Current Outpatient Medications  Medication Sig Dispense Refill   allopurinol (ZYLOPRIM) 100 MG tablet Take 100 mg by mouth daily.      amLODipine (NORVASC) 5 MG tablet Take 5 mg by mouth daily.     ascorbic acid (VITAMIN C) 500 MG tablet Take 500 mg by mouth daily.     aspirin EC 81 MG tablet  Take 81 mg by mouth daily.     bumetanide (BUMEX) 1 MG tablet Take 1 mg by mouth daily.     Camphor-Menthol-Methyl Sal (SALONPAS) 3.10-16-08 % PTCH Place 1 patch onto the skin daily as needed (pain).     cetirizine (ZYRTEC) 10 MG tablet Take 10 mg by mouth daily.     Cholecalciferol (VITAMIN D3) 1.25 MG (50000 UT) CAPS Take 1 capsule by mouth once a week.     Cyanocobalamin (VITAMIN B-12) 2500 MCG SUBL Place 2,500 mcg under the tongue daily.     diclofenac sodium (VOLTAREN) 1 % GEL Apply 2 g topically 2 (two) times daily as needed (pain).     doxazosin (CARDURA XL) 4 MG 24 hr tablet Take 4 mg by mouth at bedtime.     Exenatide ER (BYDUREON BCISE) 2 MG/0.85ML AUIJ Inject 2 mg into the  skin once a week.     glimepiride (AMARYL) 4 MG tablet Take 4 mg by mouth daily with breakfast.      glucose blood test strip 1 each by Other route as needed for other. Use as instructed     Insulin Pen Needle (NOVOFINE) 32G X 6 MM MISC by Does not apply route.     losartan (COZAAR) 100 MG tablet Take 100 mg by mouth daily.     meclizine (ANTIVERT) 25 MG tablet Take 25 mg by mouth 2 (two) times daily as needed for dizziness.     metoprolol succinate (TOPROL-XL) 100 MG 24 hr tablet Take 100 mg by mouth daily with breakfast. Take with or immediately following a meal.      pantoprazole (PROTONIX) 40 MG tablet Take 40 mg by mouth daily before breakfast.     Polyethyl Glycol-Propyl Glycol (LUBRICANT EYE DROPS) 0.4-0.3 % SOLN Place 1 drop into both eyes 3 (three) times daily as needed (dry/irritated eyes.). Systane     polyethylene glycol (MIRALAX / GLYCOLAX) 17 g packet Take 17 g by mouth daily as needed (constipation.).     potassium chloride (K-DUR) 10 MEQ tablet Take 20 mEq by mouth daily.      rosuvastatin (CRESTOR) 10 MG tablet Take 10 mg by mouth at bedtime.      tiZANidine (ZANAFLEX) 4 MG tablet Take 4 mg by mouth every 8 (eight) hours as needed for muscle spasms.      TOUJEO SOLOSTAR 300 UNIT/ML SOPN Inject 45  Units into the skin at bedtime.     No current facility-administered medications for this encounter.    REVIEW OF SYSTEMS:  Patient endorses a non-productive cough and dyspnea on exertion. She denies shortness of breath at rest or any other breathing concerns.    PHYSICAL EXAM:  height is 5\' 3"  (1.6 m) and weight is 194 lb 4 oz (88.1 kg). Her temporal temperature is 97.1 F (36.2 C) (abnormal). Her blood pressure is 178/82 (abnormal) and her pulse is 80. Her respiration is 18 and oxygen saturation is 100%.   General: Alert and oriented, in no acute distress HEENT: Head is normocephalic. Extraocular movements are intact.  Neck: Neck is supple, no palpable cervical or supraclavicular lymphadenopathy. Heart: Regular in rate and rhythm with no murmurs, rubs, or gallops. Chest: Clear to auscultation bilaterally, with no rhonchi, wheezes, or rales. Abdomen: Soft, nontender, nondistended, with no rigidity or guarding. Extremities: No cyanosis or edema. Lymphatics: see Neck Exam Skin: No concerning lesions. Musculoskeletal: symmetric strength and muscle tone throughout. Neurologic: Cranial nerves II through XII are grossly intact. No obvious focalities. Speech is fluent. Coordination is intact. Psychiatric: Judgment and insight are intact. Affect is appropriate.   ECOG = 1  0 - Asymptomatic (Fully active, able to carry on all predisease activities without restriction)  1 - Symptomatic but completely ambulatory (Restricted in physically strenuous activity but ambulatory and able to carry out work of a light or sedentary nature. For example, light housework, office work)  2 - Symptomatic, <50% in bed during the day (Ambulatory and capable of all self care but unable to carry out any work activities. Up and about more than 50% of waking hours)  3 - Symptomatic, >50% in bed, but not bedbound (Capable of only limited self-care, confined to bed or chair 50% or more of waking hours)  4 - Bedbound  (Completely disabled. Cannot carry on any self-care. Totally confined to bed or chair)  5 - Death  Oken MM, Creech RH, Tormey DC, et al. [phone]). "Toxicity and response criteria of the [facility]". Am. Evlyn Clines. Oncol. 5 (6): 649-55  LABORATORY DATA:  Lab Results  Component Value Date   WBC 4.4 05/25/2023   HGB 10.4 (L) 05/25/2023   HCT 32.1 (L) 05/25/2023   MCV 87.9 05/25/2023   PLT 161 05/25/2023   NEUTROABS 1.7 05/25/2023   Lab Results  Component Value Date   NA 142 05/25/2023   K 3.8 05/25/2023   CL 110 05/25/2023   CO2 26 05/25/2023   GLUCOSE 149 (H) 05/25/2023   BUN 23 05/25/2023   CREATININE 1.24 (H) 05/25/2023   CALCIUM 9.8 05/25/2023      RADIOGRAPHY: MR BRAIN W WO CONTRAST  Result Date: 06/12/2023 CLINICAL DATA:  Provided history: Malignant neoplasm of unspecified part of unspecified bronchus or lung. Non-small cell lung cancer, staging. EXAM: MRI HEAD WITHOUT AND WITH CONTRAST TECHNIQUE: Multiplanar, multiecho pulse sequences of the brain and surrounding structures were obtained without and with intravenous contrast. CONTRAST:  8mL GADAVIST GADOBUTROL 1 MMOL/ML IV SOLN COMPARISON:  None. FINDINGS: Brain: No age advanced or lobar predominant parenchymal atrophy. 2 mm focus of cortical enhancement within the right frontoparietal operculum (series 102, image 64) (series 18, image 3). Mild multifocal T2 FLAIR hyperintense signal abnormality within the cerebral white matter. There is no acute infarct. No chronic intracranial blood products. No extra-axial fluid collection. No midline shift. Vascular: Maintained flow voids within the proximal large arterial vessels. Skull and upper cervical spine: No focal suspicious marrow lesion. Incompletely assessed cervical spondylosis. Sinuses/Orbits: No mass or acute finding within the imaged orbits. Prior bilateral ocular lens replacement. Minimal mucosal thickening within the bilateral ethmoid and maxillary sinuses.  Small fluid level also present within the left maxillary sinus. Other: Trace fluid within the right mastoid air cells. Impression #1 will be called to the ordering clinician or representative by the Radiologist Assistant, and communication documented in the PACS or [employer]. IMPRESSION: 1. 2 mm focus of cortical enhancement within the right frontoparietal operculum, indeterminate for a metastasis versus vascular enhancement. A short-interval follow-up brain MRI (with and without contrast) is recommended in 6 weeks. 2. Mild T2 FLAIR hyperintense signal changes within the cerebral white matter, nonspecific but most often secondary to chronic small vessel ischemia. 3. Paranasal sinus disease as described. Electronically Signed   By: Jackey Loge D.O.   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 She has met with Dr. Dorris Fetch and discussed her options for surgery. Although she is a candidate for surgery, she is interested in a less invasive treatment option with the understanding that her best chances for cure would be surgery.  We reviewed imaging with her today and believe she is a good candidate for radiation therapy to the visualized tumor.  Today, I talked to the patient and family about the findings and work-up thus far.  We discussed the natural history of early stage lung disease and general treatment, highlighting the role of radiotherapy in the management.  We discussed the available radiation techniques, and focused on the details of logistics and delivery.  We reviewed the anticipated acute and late sequelae associated with radiation in this setting.The patient was encouraged to ask questions that I answered to the best of my ability. The patient expressed understanding of the treatment, which is of curative intent. A consent  form was discussed and freely signed today.   PLAN: We will share our  discussion with Dr. Dorris Fetch today. Patient is scheduled for CT simulation tomorrow, 06/24/2023.  Based on the location of the lesion it is doubtful that she can have SBRT, anticipate 10 fractions of ultra-hypofractionated radiation treatment.   60 minutes of total time was spent for this patient encounter, including preparation, face-to-face counseling with the patient and coordination of care, physical exam, and documentation of the encounter.   ------------------------------------------------   Joyice Faster, PA-C   Billie Lade, PhD, MD  This document serves as a record of services personally performed by Antony Blackbird, MD. It was created on his behalf by Neena Rhymes, a trained medical scribe. The creation of this record is based on the scribe's personal observations and the provider's statements to them. This document has been checked and approved by the attending provider.

## 2023-06-24 ENCOUNTER — Ambulatory Visit
Admission: RE | Admit: 2023-06-24 | Discharge: 2023-06-24 | Disposition: A | Discharge status: Home / Self Care | Payer: Medicare Other | Source: Ambulatory Visit | Attending: Radiation Oncology | Admitting: Radiation Oncology

## 2023-06-24 DIAGNOSIS — C3411 Malignant neoplasm of upper lobe, right bronchus or lung: Secondary | ICD-10-CM | POA: Diagnosis not present

## 2023-06-28 DIAGNOSIS — Z23 Encounter for immunization: Secondary | ICD-10-CM | POA: Diagnosis not present

## 2023-07-04 ENCOUNTER — Telehealth: Payer: Self-pay | Admitting: Radiation Oncology

## 2023-07-04 NOTE — Telephone Encounter (Signed)
9/23 Received Family and medical Leave forms for patient daughter Carol Keith, to be fill out and return to [employer].  Forms placed in orange folder and given to [facility] and/or staff for completion and return.

## 2023-07-05 DIAGNOSIS — C3411 Malignant neoplasm of upper lobe, right bronchus or lung: Secondary | ICD-10-CM | POA: Diagnosis not present

## 2023-07-06 ENCOUNTER — Encounter: Payer: Self-pay | Admitting: Emergency Medicine

## 2023-07-06 ENCOUNTER — Ambulatory Visit: Payer: Medicare Other | Admitting: Emergency Medicine

## 2023-07-06 VITALS — BP 138/82 | HR 69 | Ht 63.0 in | Wt 196.8 lb

## 2023-07-06 DIAGNOSIS — C3411 Malignant neoplasm of upper lobe, right bronchus or lung: Secondary | ICD-10-CM

## 2023-07-06 NOTE — Progress Notes (Signed)
Subjective:    Patient ID: Carol Keith, female    DOB: [birth year], 72 y.o.   MRN: [id number]  HPI 72 yo woman, never smoker, with hx R breast CA (2005, lumpectomy + XRT, lumpectomy), DM, HTN, CKD.  She is here to evaluate an abnormal CT chest CXR was done 03/22/23 to evaulate chronic cough. This showed a RUL opacity that prompted CT as below.  She reports that she still has some mild cough. No dyspnea.   CT chest 03/25/23 reviewed by me shows an isolated 2.3 x 3.8 cm RUL mass. No mediastinal adenopathy.   ROV 07/06/2023 --follow-up visit for 72 year old woman with history of remote breast cancer (2005), also diabetes, hypertension and CKD.  I saw her in late July for an abnormal CT scan of the chest that showed a 2.3 x 3.8 cm right upper lobe mass.  She underwent robotic assisted navigational bronchoscopy 7/29 and her pathology was consistent with a primary lung adenocarcinoma without any evidence of distant disease on PET scan.  She has been seen by Dr. Arbutus Ped, Dr. Dorris Fetch and Dr. Roselind Messier.  There is some concern that there could be some recurrent nerve involvement given some right vocal cord abnormalities.  After hearing the options she is interested in SBRT. She is coughing less, no mucous, no hemoptysis. She can get a little SOB with housework. She has some persistent L UE pain, shoulder pain.   Pulmonary function testing done 06/03/2023 reviewed by me, show normal airflows without a bronchodilator response, slightly decreased lung volumes consistent with mild restriction, decreased diffusion capacity that corrects to the normal range when adjusted for alveolar volume   Review of Systems As per HPI  Past Medical History:  Diagnosis Date   Allergic rhinitis    Arthritis    kneees, back    Breast cancer (HCC)    Bursitis of right hip    Cancer (HCC)    breast cancer, right   Cerumen impaction    Chronic kidney disease    appt. schedule for Dr. Mckinley Jewel, october 2020    Complication of anesthesia    Diabetes mellitus without complication (HCC)    Type II   Dizziness    Fatigue    Frequent urination    Gout    Heart murmur    Echo11/21/16 ([facility]): LVEF 50-55%, reduced LV diastolic function, mild TR    Hypercholesteremia    Hypertension    Hypokalemia    Left foot pain    Left shoulder pain    Lumbago    Oral candida    Ovarian failure    Plantar fascial fibromatosis    PONV (postoperative nausea and vomiting)    Proteinuria    Right knee pain      Family History  Problem Relation Age of Onset   Breast cancer Sister        age 21   BRCA 1/2 Daughter    Breast cancer Daughter      Social History   Socioeconomic History   Marital status: Married    Spouse name: Not on file   Number of children: Not on file   Years of education: Not on file   Highest education level: Not on file  Occupational History    Comment: [employer]  Tobacco Use   Smoking status: Never   Smokeless tobacco: Never  Vaping Use   Vaping status: Never Used  Substance and Sexual Activity   Alcohol use: No   Drug  use: No   Sexual activity: Not on file  Other Topics Concern   Not on file  Social History Narrative   Not on file   Social Determinants of Health   Financial Resource Strain: Not on file  Food Insecurity: Food Insecurity Present (06/23/2023)   Hunger Vital Sign    Worried About Running Out of Food in the Last Year: Never true    Ran Out of Food in the Last Year: Sometimes true  Transportation Needs: No Transportation Needs (06/23/2023)   PRAPARE - Administrator, Civil Service (Medical): No    Lack of Transportation (Non-Medical): No  Physical Activity: Not on file  Stress: Not on file  Social Connections: Not on file  Intimate Partner Violence: Not At Risk (06/23/2023)   Humiliation, Afraid, Rape, and Kick questionnaire    Fear of Current or Ex-Partner: No    Emotionally Abused: No    Physically Abused: No    Sexually  Abused: No    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  No TB exposure  Allergies  Allergen Reactions   Ace Inhibitors     Cough    Actos [Pioglitazone]     Edema    Lipitor [Atorvastatin]     Chest tightness    Metformin And Related Diarrhea     Outpatient Medications Prior to Visit  Medication Sig Dispense Refill   allopurinol (ZYLOPRIM) 100 MG tablet Take 100 mg by mouth daily.      amLODipine (NORVASC) 5 MG tablet Take 5 mg by mouth daily.     ascorbic acid (VITAMIN C) 500 MG tablet Take 500 mg by mouth daily.     aspirin EC 81 MG tablet Take 81 mg by mouth daily.     bumetanide (BUMEX) 1 MG tablet Take 1 mg by mouth daily.     Camphor-Menthol-Methyl Sal (SALONPAS) 3.10-16-08 % PTCH Place 1 patch onto the skin daily as needed (pain).     cetirizine (ZYRTEC) 10 MG tablet Take 10 mg by mouth daily.     Cholecalciferol (VITAMIN D3) 1.25 MG (50000 UT) CAPS Take 1 capsule by mouth once a week.     Cyanocobalamin (VITAMIN B-12) 2500 MCG SUBL Place 2,500 mcg under the tongue daily.     diclofenac sodium (VOLTAREN) 1 % GEL Apply 2 g topically 2 (two) times daily as needed (pain).     doxazosin (CARDURA XL) 4 MG 24 hr tablet Take 4 mg by mouth at bedtime.     Exenatide ER (BYDUREON BCISE) 2 MG/0.85ML AUIJ Inject 2 mg into the skin once a week.     glimepiride (AMARYL) 4 MG tablet Take 4 mg by mouth daily with breakfast.      glucose blood test strip 1 each by Other route as needed for other. Use as instructed     Insulin Pen Needle (NOVOFINE) 32G X 6 MM MISC by Does not apply route.     losartan (COZAAR) 100 MG tablet Take 100 mg by mouth daily.     meclizine (ANTIVERT) 25 MG tablet Take 25 mg by mouth 2 (two) times daily as needed for dizziness.     metoprolol succinate (TOPROL-XL) 100 MG 24 hr tablet Take 100 mg by mouth daily with breakfast. Take with or immediately following a meal.      pantoprazole (PROTONIX) 40 MG tablet Take 40 mg by mouth daily before breakfast.     Polyethyl  Glycol-Propyl Glycol (LUBRICANT EYE DROPS) 0.4-0.3 % SOLN Place 1  drop into both eyes 3 (three) times daily as needed (dry/irritated eyes.). Systane     polyethylene glycol (MIRALAX / GLYCOLAX) 17 g packet Take 17 g by mouth daily as needed (constipation.).     potassium chloride (K-DUR) 10 MEQ tablet Take 20 mEq by mouth daily.      rosuvastatin (CRESTOR) 10 MG tablet Take 10 mg by mouth at bedtime.      tiZANidine (ZANAFLEX) 4 MG tablet Take 4 mg by mouth every 8 (eight) hours as needed for muscle spasms.      TOUJEO SOLOSTAR 300 UNIT/ML SOPN Inject 45 Units into the skin at bedtime.     No facility-administered medications prior to visit.         Objective:   Physical Exam  Vitals:   07/06/23 1521  BP: 138/82  Pulse: 69  SpO2: 98%  Weight: 196 lb 12.8 oz (89.3 kg)  Height: 5\' 3"  (1.6 m)    Gen: Pleasant, well-nourished, in no distress,  normal affect  ENT: No lesions,  mouth clear,  oropharynx clear, no postnasal drip  Neck: No JVD, no stridor  Lungs: No use of accessory muscles, no crackles or wheezing on normal respiration, no wheeze on forced expiration  Cardiovascular: RRR, heart sounds normal, no murmur or gallops, no peripheral edema  Musculoskeletal: No deformities, no cyanosis or clubbing  Neuro: alert, awake, non focal  Skin: Warm, no lesions or rash      Assessment & Plan:  Adenocarcinoma of upper lobe of right lung [facility]) She has consulted with Dr. Dorris Fetch, [place], [place].  She is going to pursue SBRT.  The 2 mm abnormality on MRI brain will need to be followed with a repeat MRI in 6 months.  Good functional capacity, improved cough.  She can follow-up with me if she has any clinical change.    Levy Pupa, MD, PhD 07/06/2023, 3:39 PM [facility] [phone] or if no answer before 7:00PM call [phone] For any issues after 7:00PM please call eLink [phone]

## 2023-07-06 NOTE — Assessment & Plan Note (Signed)
She has consulted with Dr. Dorris Fetch, [place], [place].  She is going to pursue SBRT.  The 2 mm abnormality on MRI brain will need to be followed with a repeat MRI in 6 months.  Good functional capacity, improved cough.  She can follow-up with me if she has any clinical change.

## 2023-07-06 NOTE — Patient Instructions (Signed)
Follow-up with Dr. Roselind Messier and Dr. Arbutus Ped as planned Follow Dr. Delton Coombes if needed for any changes in your breathing, cough, or respiratory symptoms.

## 2023-07-07 ENCOUNTER — Encounter: Payer: Medicare Other | Admitting: Genetic Counselor

## 2023-07-07 ENCOUNTER — Encounter: Payer: Self-pay | Admitting: Radiation Oncology

## 2023-07-08 DIAGNOSIS — M25512 Pain in left shoulder: Secondary | ICD-10-CM | POA: Diagnosis not present

## 2023-07-11 ENCOUNTER — Ambulatory Visit
Admission: RE | Admit: 2023-07-11 | Discharge: 2023-07-11 | Disposition: A | Discharge status: Home / Self Care | Payer: Medicare Other | Source: Ambulatory Visit | Attending: Radiation Oncology | Admitting: Radiation Oncology

## 2023-07-11 ENCOUNTER — Other Ambulatory Visit: Payer: Self-pay

## 2023-07-11 DIAGNOSIS — C3411 Malignant neoplasm of upper lobe, right bronchus or lung: Secondary | ICD-10-CM | POA: Insufficient documentation

## 2023-07-11 DIAGNOSIS — Z510 Encounter for antineoplastic radiation therapy: Secondary | ICD-10-CM | POA: Diagnosis not present

## 2023-07-11 DIAGNOSIS — I10 Essential (primary) hypertension: Secondary | ICD-10-CM | POA: Diagnosis not present

## 2023-07-11 LAB — RAD ONC ARIA SESSION SUMMARY
Course Elapsed Days: 0
Plan Fractions Treated to Date: 1
Plan Prescribed Dose Per Fraction: 5 Gy
Plan Total Fractions Prescribed: 10
Plan Total Prescribed Dose: 50 Gy
Reference Point Dosage Given to Date: 5 Gy
Reference Point Session Dosage Given: 5 Gy
Session Number: 1

## 2023-07-12 ENCOUNTER — Other Ambulatory Visit: Payer: Self-pay

## 2023-07-12 ENCOUNTER — Inpatient Hospital Stay: Payer: Medicare Other

## 2023-07-12 ENCOUNTER — Inpatient Hospital Stay: Payer: Medicare Other | Attending: Internal Medicine | Admitting: Genetic Counselor

## 2023-07-12 ENCOUNTER — Ambulatory Visit
Admission: RE | Admit: 2023-07-12 | Discharge: 2023-07-12 | Disposition: A | Discharge status: Home / Self Care | Payer: Medicare Other | Source: Ambulatory Visit | Attending: Radiation Oncology | Admitting: Radiation Oncology

## 2023-07-12 ENCOUNTER — Ambulatory Visit: Payer: Medicare Other

## 2023-07-12 ENCOUNTER — Other Ambulatory Visit: Payer: Self-pay | Admitting: Genetic Counselor

## 2023-07-12 DIAGNOSIS — Z803 Family history of malignant neoplasm of breast: Secondary | ICD-10-CM

## 2023-07-12 DIAGNOSIS — Z8481 Family history of carrier of genetic disease: Secondary | ICD-10-CM | POA: Diagnosis not present

## 2023-07-12 DIAGNOSIS — Z1379 Encounter for other screening for genetic and chromosomal anomalies: Secondary | ICD-10-CM

## 2023-07-12 DIAGNOSIS — Z510 Encounter for antineoplastic radiation therapy: Secondary | ICD-10-CM | POA: Diagnosis not present

## 2023-07-12 DIAGNOSIS — C3411 Malignant neoplasm of upper lobe, right bronchus or lung: Secondary | ICD-10-CM | POA: Diagnosis not present

## 2023-07-12 DIAGNOSIS — Z800 Family history of malignant neoplasm of digestive organs: Secondary | ICD-10-CM | POA: Diagnosis not present

## 2023-07-12 DIAGNOSIS — Z853 Personal history of malignant neoplasm of breast: Secondary | ICD-10-CM

## 2023-07-12 LAB — RAD ONC ARIA SESSION SUMMARY
Course Elapsed Days: 1
Plan Fractions Treated to Date: 2
Plan Prescribed Dose Per Fraction: 5 Gy
Plan Total Fractions Prescribed: 10
Plan Total Prescribed Dose: 50 Gy
Reference Point Dosage Given to Date: 10 Gy
Reference Point Session Dosage Given: 5 Gy
Session Number: 2

## 2023-07-12 LAB — GENETIC SCREENING ORDER

## 2023-07-13 ENCOUNTER — Other Ambulatory Visit: Payer: Self-pay

## 2023-07-13 ENCOUNTER — Encounter: Payer: Self-pay | Admitting: Genetic Counselor

## 2023-07-13 ENCOUNTER — Ambulatory Visit
Admission: RE | Admit: 2023-07-13 | Discharge: 2023-07-13 | Disposition: A | Discharge status: Home / Self Care | Payer: Medicare Other | Source: Ambulatory Visit | Attending: Radiation Oncology | Admitting: Radiation Oncology

## 2023-07-13 DIAGNOSIS — C3411 Malignant neoplasm of upper lobe, right bronchus or lung: Secondary | ICD-10-CM | POA: Diagnosis not present

## 2023-07-13 DIAGNOSIS — Z510 Encounter for antineoplastic radiation therapy: Secondary | ICD-10-CM | POA: Diagnosis not present

## 2023-07-13 LAB — RAD ONC ARIA SESSION SUMMARY
Course Elapsed Days: 2
Plan Fractions Treated to Date: 3
Plan Prescribed Dose Per Fraction: 5 Gy
Plan Total Fractions Prescribed: 10
Plan Total Prescribed Dose: 50 Gy
Reference Point Dosage Given to Date: 15 Gy
Reference Point Session Dosage Given: 5 Gy
Session Number: 3

## 2023-07-13 NOTE — Progress Notes (Signed)
REFERRING PROVIDER: Si Gaul, MD   PRIMARY PROVIDER:  Kirstie Peri, MD  PRIMARY REASON FOR VISIT:  1. Family history of BRCA gene mutation   2. Personal history of breast cancer   3. Family history of breast cancer   4. Adenocarcinoma of upper lobe of right lung (HCC)    HISTORY OF PRESENT ILLNESS:   Carol Keith, a 72 y.o. female, was seen for a [facility] cancer genetics consultation at the request of Dr. Arbutus Ped due to a personal and family history of cancer. Additionally, one of her daughters has a BRCA1 gene mutation.  Carol Keith presents to clinic today to discuss the possibility of a hereditary predisposition to cancer, to discuss genetic testing, and to further clarify her future cancer risks, as well as potential cancer risks for family members.   Carol Keith was diagnosed with breast cancer in 2005 (~72 y.o.) and she was recently diagnosed with lung cancer at age 78.   CANCER HISTORY:  Oncology History  Adenocarcinoma of upper lobe of right lung (HCC)  05/25/2023 Initial Diagnosis   Adenocarcinoma of upper lobe of right lung (HCC)   05/25/2023 Cancer Staging   Staging form: Lung, AJCC 8th Edition - Clinical: Stage IB (cT2a, cN0, cM0) - Signed by Si Gaul, MD on 05/25/2023     RISK FACTORS:  First live birth at age 1.  OCP use for approximately  15  years.  Ovaries intact: no.  Uterus intact: no.  Menopausal status: postmenopausal.  HRT use: 0 years. Colonoscopy: yes;  she reports polyps were identified . Mammogram within the last year: yes. Any excessive radiation exposure in the past: no  Past Medical History:  Diagnosis Date   Allergic rhinitis    Arthritis    kneees, back    Breast cancer (HCC)    Bursitis of right hip    Cancer (HCC)    breast cancer, right   Cerumen impaction    Chronic kidney disease    appt. schedule for Dr. Mckinley Jewel, october 2020   Complication of anesthesia    Diabetes mellitus without complication (HCC)     Type II   Dizziness    Fatigue    Frequent urination    Gout    Keith murmur    Echo11/21/16 ([facility]): LVEF 50-55%, reduced LV diastolic function, mild TR    Hypercholesteremia    Hypertension    Hypokalemia    Left foot pain    Left shoulder pain    Lumbago    Oral candida    Ovarian failure    Plantar fascial fibromatosis    PONV (postoperative nausea and vomiting)    Proteinuria    Right knee pain     Past Surgical History:  Procedure Laterality Date   ABDOMINAL HYSTERECTOMY     complete   APPENDECTOMY     BILIARY BRUSHING  02/07/2019   Procedure: BILIARY BRUSHING;  Surgeon: Lemar Lofty., MD;  Location: [facility] ENDOSCOPY;  Service: Gastroenterology;;   BILIARY BRUSHING  07/04/2019   Procedure: BILIARY BRUSHING;  Surgeon: Lemar Lofty., MD;  Location: [facility] ENDOSCOPY;  Service: Gastroenterology;;   BILIARY DILATION  02/07/2019   Procedure: BILIARY DILATION;  Surgeon: Lemar Lofty., MD;  Location: [facility] ENDOSCOPY;  Service: Gastroenterology;;   BILIARY DILATION  07/04/2019   Procedure: BILIARY DILATION;  Surgeon: Lemar Lofty., MD;  Location: [facility] ENDOSCOPY;  Service: Gastroenterology;;   BIOPSY  02/07/2019   Procedure: BIOPSY;  Surgeon: Lemar Lofty., MD;  Location: [facility] ENDOSCOPY;  Service: Gastroenterology;;   BIOPSY  07/04/2019   Procedure: BIOPSY;  Surgeon: Meridee Score Netty Starring., MD;  Location: [facility] ENDOSCOPY;  Service: Gastroenterology;;   BREAST LUMPECTOMY Right 2015   BREAST SURGERY Right    for Breast Ca, tx with surg. & chemo & radiation     BRONCHIAL BIOPSY  05/09/2023   Procedure: BRONCHIAL BIOPSIES;  Surgeon: Leslye Peer, MD;  Location: [facility] ENDOSCOPY;  Service: Pulmonary;;   BRONCHIAL BRUSHINGS  05/09/2023   Procedure: BRONCHIAL BRUSHINGS;  Surgeon: Leslye Peer, MD;  Location: [facility] ENDOSCOPY;  Service: Pulmonary;;   BRONCHIAL NEEDLE ASPIRATION BIOPSY  05/09/2023   Procedure: BRONCHIAL NEEDLE ASPIRATION BIOPSIES;   Surgeon: Leslye Peer, MD;  Location: [facility] ENDOSCOPY;  Service: Pulmonary;;   CATARACT EXTRACTION W/PHACO Left 08/06/2019   Procedure: CATARACT EXTRACTION PHACO AND INTRAOCULAR LENS PLACEMENT LEFT EYE;  Surgeon: Fabio Pierce, MD;  Location: [facility] ORS;  Service: Ophthalmology;  Laterality: Left;  CDE: 9.01   CATARACT EXTRACTION W/PHACO Right 08/20/2019   Procedure: CATARACT EXTRACTION PHACO AND INTRAOCULAR LENS PLACEMENT RIGHT EYE CDE=5.88;  Surgeon: Fabio Pierce, MD;  Location: [facility] ORS;  Service: Ophthalmology;  Laterality: Right;  right   CHOLECYSTECTOMY     COLONOSCOPY     ENDOSCOPIC RETROGRADE CHOLANGIOPANCREATOGRAPHY (ERCP) WITH PROPOFOL N/A 07/04/2019   Procedure: ENDOSCOPIC RETROGRADE CHOLANGIOPANCREATOGRAPHY (ERCP) WITH PROPOFOL;  Surgeon: Meridee Score Netty Starring., MD;  Location: [facility] ENDOSCOPY;  Service: Gastroenterology;  Laterality: N/A;   ERCP N/A 02/07/2019   Procedure: ENDOSCOPIC RETROGRADE CHOLANGIOPANCREATOGRAPHY (ERCP);  Surgeon: Lemar Lofty., MD;  Location: [facility] ENDOSCOPY;  Service: Gastroenterology;  Laterality: N/A;   ESOPHAGOGASTRODUODENOSCOPY (EGD) WITH PROPOFOL N/A 02/07/2019   Procedure: ESOPHAGOGASTRODUODENOSCOPY (EGD) WITH PROPOFOL;  Surgeon: Meridee Score Netty Starring., MD;  Location: [facility] ENDOSCOPY;  Service: Gastroenterology;  Laterality: N/A;   ESOPHAGOGASTRODUODENOSCOPY (EGD) WITH PROPOFOL N/A 07/04/2019   Procedure: ESOPHAGOGASTRODUODENOSCOPY (EGD) WITH PROPOFOL;  Surgeon: Meridee Score Netty Starring., MD;  Location: [facility] ENDOSCOPY;  Service: Gastroenterology;  Laterality: N/A;   EUS N/A 07/04/2019   Procedure: UPPER ENDOSCOPIC ULTRASOUND (EUS) RADIAL;  Surgeon: Lemar Lofty., MD;  Location: [facility] ENDOSCOPY;  Service: Gastroenterology;  Laterality: N/A;   JOINT REPLACEMENT Bilateral    knee   KNEE ARTHROPLASTY Right 05/17/2016   Procedure: RIGHT TOTAL KNEE ARTHROPLASTY WITH COMPUTER NAVIGATION;  Surgeon: Samson Frederic, MD;  Location: [facility] OR;  Service: Orthopedics;   Laterality: Right;  Needs RNFA   KNEE ARTHROPLASTY Left 01/24/2017   Procedure: LEFT TOTAL KNEE ARTHROPLASTY WITH COMPUTER NAVIGATION;  Surgeon: Samson Frederic, MD;  Location: [facility] OR;  Service: Orthopedics;  Laterality: Left;  Dr. requesting RNFA   LAPAROSCOPY  1999   done at [facility], "something with separating my organs because things had grown together"   REMOVAL OF STONES  02/07/2019   Procedure: REMOVAL OF STONES;  Surgeon: Lemar Lofty., MD;  Location: [facility] ENDOSCOPY;  Service: Gastroenterology;;   Dennison Mascot  02/07/2019   Procedure: Dennison Mascot;  Surgeon: Lemar Lofty., MD;  Location: [facility] ENDOSCOPY;  Service: Gastroenterology;;   TONSILLECTOMY     TUBAL LIGATION     UPPER ESOPHAGEAL ENDOSCOPIC ULTRASOUND (EUS)  02/07/2019   Procedure: UPPER ESOPHAGEAL ENDOSCOPIC ULTRASOUND (EUS);  Surgeon: Lemar Lofty., MD;  Location: [facility] ENDOSCOPY;  Service: Gastroenterology;;   VIDEO BRONCHOSCOPY WITH RADIAL ENDOBRONCHIAL ULTRASOUND  05/09/2023   Procedure: VIDEO BRONCHOSCOPY WITH RADIAL ENDOBRONCHIAL ULTRASOUND;  Surgeon: Leslye Peer, MD;  Location: [facility] ENDOSCOPY;  Service: Pulmonary;;    Social History   Socioeconomic History  Marital status: Married    Spouse name: Not on file   Number of children: Not on file   Years of education: Not on file   Highest education level: Not on file  Occupational History    Comment: [employer]  Tobacco Use   Smoking status: Never   Smokeless tobacco: Never  Vaping Use   Vaping status: Never Used  Substance and Sexual Activity   Alcohol use: No   Drug use: No   Sexual activity: Not on file  Other Topics Concern   Not on file  Social History Narrative   Not on file   Social Determinants of Health   Financial Resource Strain: Not on file  Food Insecurity: Food Insecurity Present (06/23/2023)   Hunger Vital Sign    Worried About Running Out of Food in the Last Year: Never true    Ran Out of Food in the Last  Year: Sometimes true  Transportation Needs: No Transportation Needs (06/23/2023)   PRAPARE - Administrator, Civil Service (Medical): No    Lack of Transportation (Non-Medical): No  Physical Activity: Not on file  Stress: Not on file  Social Connections: Not on file     FAMILY HISTORY:  We obtained a detailed, 4-generation family history.  Significant diagnoses are listed below: Family History  Problem Relation Age of Onset   Brain cancer Father 76   Breast cancer Sister 32       contralateral breast cancer dx. 48   Colon cancer Sister 98   Breast cancer Maternal Aunt    Colon cancer Maternal Aunt    Colon cancer Maternal Aunt    Breast cancer Daughter 29       BRCA1+   Ovarian cancer Niece 73     Carol Keith's daughter was diagnosed with breast cancer at age 11 and she has a BRCA1 gene mutation [phone]). Her sister was diagnosed with breast cancer at age 73, contralateral breast cancer at age 18, and colon cancer at age 23. Her niece (daughter of one of her brothers) was diagnosed with ovarian cancer at age 25. Carol Keith has 6 maternal aunts, one was diagnosed with breast cancer and two were diagnosed with colon cancer, they are all deceased. Her father was diagnosed with brain cancer at age 58, he died at age 57. There is no reported Ashkenazi Jewish ancestry.  GENETIC COUNSELING ASSESSMENT: Carol Keith is a 72 y.o. female with a personal and family history of cancer which is somewhat suggestive of a hereditary predisposition to cancer given her daughter's BRCA1 mutation. We, therefore, discussed and recommended the following at today's visit.   DISCUSSION: We discussed that 5 - 10% of cancer is hereditary, with most cases of breast cancer associated with BRCA1/2.  There are other genes that can be associated with hereditary breast cancer syndromes.  We discussed that testing is beneficial for several reasons including knowing how to follow individuals after  completing their treatment, identifying whether potential treatment options would be beneficial, and understanding if other family members could be at risk for cancer and allowing them to undergo genetic testing.   We reviewed the characteristics, features and inheritance patterns of hereditary cancer syndromes. We reviewed the cancer risks and management recommendations for individuals with a BRCA1 gene mutation. We also discussed genetic testing, including the appropriate family members to test, the process of testing, insurance coverage and turn-around-time for results. We discussed the implications of a negative, positive, carrier and/or variant of  uncertain significant result. We recommended Carol Keith pursue genetic testing for a panel that includes genes associated with cancer.   Carol Keith elected to have [facility] CancerNext Panel. The CancerNext gene panel offered by [employer] includes sequencing, rearrangement analysis, and RNA analysis for the following 34 genes:   APC, ATM, AXIN2, BARD1, BMPR1A, BRCA1, BRCA2, BRIP1, CDH1, CDK4, CDKN2A, CHEK2, DICER1, HOXB13, EPCAM, GREM1, MLH1, MSH2, MSH3, MSH6, MUTYH, NF1, NTHL1, PALB2, PMS2, POLD1, POLE, PTEN, RAD51C, RAD51D, SMAD4, SMARCA4, STK11, and TP53.   Based on Ms. Haymond's personal and family history of cancer, she meets medical criteria for genetic testing. Despite that she meets criteria, she may still have an out of pocket cost. We discussed that if her out of pocket cost for testing is over $100, the laboratory should contact them to discuss self-pay prices, patient pay assistance programs, if applicable, and other billing options.  PLAN: After considering the risks, benefits, and limitations, Carol Keith provided informed consent to pursue genetic testing and the blood sample was sent to [facility] for analysis of the CancerNext Panel. Results should be available within approximately 2-3 weeks' time, at which point they will be  disclosed by telephone to Carol Keith, as will any additional recommendations warranted by these results. Carol Keith will receive a summary of her genetic counseling visit and a copy of her results once available. This information will also be available in Epic.   Carol Keith questions were answered to her satisfaction today. Our contact information was provided should additional questions or concerns arise. Thank you for the referral and allowing [place] to share in the care of your patient.   Lalla Brothers, MS, [facility] Genetic Counselor [email or web address] (P) [phone]  The patient was seen for a total of 30 minutes in face-to-face genetic counseling. The patient was seen alone.  Drs. Pamelia Hoit and/or Mosetta Putt were available to discuss this case as needed.   _______________________________________________________________________ For Office Staff:  Number of people involved in session: 1 Was an Intern/ student involved with case: no

## 2023-07-14 ENCOUNTER — Ambulatory Visit
Admission: RE | Admit: 2023-07-14 | Discharge: 2023-07-14 | Discharge status: Home / Self Care | Payer: Medicare Other | Source: Ambulatory Visit | Attending: Radiation Oncology | Admitting: Radiation Oncology

## 2023-07-14 ENCOUNTER — Other Ambulatory Visit: Payer: Self-pay

## 2023-07-14 DIAGNOSIS — Z510 Encounter for antineoplastic radiation therapy: Secondary | ICD-10-CM | POA: Diagnosis not present

## 2023-07-14 DIAGNOSIS — C3411 Malignant neoplasm of upper lobe, right bronchus or lung: Secondary | ICD-10-CM | POA: Diagnosis not present

## 2023-07-14 LAB — RAD ONC ARIA SESSION SUMMARY
Course Elapsed Days: 3
Plan Fractions Treated to Date: 4
Plan Prescribed Dose Per Fraction: 5 Gy
Plan Total Fractions Prescribed: 10
Plan Total Prescribed Dose: 50 Gy
Reference Point Dosage Given to Date: 20 Gy
Reference Point Session Dosage Given: 5 Gy
Session Number: 4

## 2023-07-15 ENCOUNTER — Ambulatory Visit
Admission: RE | Admit: 2023-07-15 | Discharge: 2023-07-15 | Disposition: A | Discharge status: Home / Self Care | Payer: Medicare Other | Source: Ambulatory Visit | Attending: Radiation Oncology | Admitting: Radiation Oncology

## 2023-07-15 ENCOUNTER — Other Ambulatory Visit: Payer: Self-pay

## 2023-07-15 DIAGNOSIS — C3411 Malignant neoplasm of upper lobe, right bronchus or lung: Secondary | ICD-10-CM | POA: Diagnosis not present

## 2023-07-15 DIAGNOSIS — Z510 Encounter for antineoplastic radiation therapy: Secondary | ICD-10-CM | POA: Diagnosis not present

## 2023-07-15 LAB — RAD ONC ARIA SESSION SUMMARY
Course Elapsed Days: 4
Plan Fractions Treated to Date: 5
Plan Prescribed Dose Per Fraction: 5 Gy
Plan Total Fractions Prescribed: 10
Plan Total Prescribed Dose: 50 Gy
Reference Point Dosage Given to Date: 25 Gy
Reference Point Session Dosage Given: 5 Gy
Session Number: 5

## 2023-07-18 ENCOUNTER — Other Ambulatory Visit: Payer: Self-pay

## 2023-07-18 ENCOUNTER — Ambulatory Visit
Admission: RE | Admit: 2023-07-18 | Discharge: 2023-07-18 | Disposition: A | Discharge status: Home / Self Care | Payer: Medicare Other | Source: Ambulatory Visit | Attending: Radiation Oncology | Admitting: Radiation Oncology

## 2023-07-18 DIAGNOSIS — C3411 Malignant neoplasm of upper lobe, right bronchus or lung: Secondary | ICD-10-CM | POA: Diagnosis not present

## 2023-07-18 DIAGNOSIS — Z510 Encounter for antineoplastic radiation therapy: Secondary | ICD-10-CM | POA: Diagnosis not present

## 2023-07-18 LAB — RAD ONC ARIA SESSION SUMMARY
Course Elapsed Days: 7
Plan Fractions Treated to Date: 6
Plan Prescribed Dose Per Fraction: 5 Gy
Plan Total Fractions Prescribed: 10
Plan Total Prescribed Dose: 50 Gy
Reference Point Dosage Given to Date: 30 Gy
Reference Point Session Dosage Given: 5 Gy
Session Number: 6

## 2023-07-19 ENCOUNTER — Ambulatory Visit
Admission: RE | Admit: 2023-07-19 | Discharge: 2023-07-19 | Disposition: A | Discharge status: Home / Self Care | Payer: Medicare Other | Source: Ambulatory Visit | Attending: Radiation Oncology | Admitting: Radiation Oncology

## 2023-07-19 ENCOUNTER — Other Ambulatory Visit: Payer: Self-pay

## 2023-07-19 DIAGNOSIS — Z510 Encounter for antineoplastic radiation therapy: Secondary | ICD-10-CM | POA: Diagnosis not present

## 2023-07-19 DIAGNOSIS — C3411 Malignant neoplasm of upper lobe, right bronchus or lung: Secondary | ICD-10-CM | POA: Diagnosis not present

## 2023-07-19 LAB — RAD ONC ARIA SESSION SUMMARY
Course Elapsed Days: 8
Plan Fractions Treated to Date: 7
Plan Prescribed Dose Per Fraction: 5 Gy
Plan Total Fractions Prescribed: 10
Plan Total Prescribed Dose: 50 Gy
Reference Point Dosage Given to Date: 35 Gy
Reference Point Session Dosage Given: 5 Gy
Session Number: 7

## 2023-07-20 ENCOUNTER — Ambulatory Visit
Admission: RE | Admit: 2023-07-20 | Discharge: 2023-07-20 | Disposition: A | Discharge status: Home / Self Care | Payer: Medicare Other | Source: Ambulatory Visit | Attending: Radiation Oncology | Admitting: Radiation Oncology

## 2023-07-20 ENCOUNTER — Other Ambulatory Visit: Payer: Self-pay

## 2023-07-20 DIAGNOSIS — Z510 Encounter for antineoplastic radiation therapy: Secondary | ICD-10-CM | POA: Diagnosis not present

## 2023-07-20 DIAGNOSIS — C3411 Malignant neoplasm of upper lobe, right bronchus or lung: Secondary | ICD-10-CM | POA: Diagnosis not present

## 2023-07-20 LAB — RAD ONC ARIA SESSION SUMMARY
Course Elapsed Days: 9
Plan Fractions Treated to Date: 8
Plan Prescribed Dose Per Fraction: 5 Gy
Plan Total Fractions Prescribed: 10
Plan Total Prescribed Dose: 50 Gy
Reference Point Dosage Given to Date: 40 Gy
Reference Point Session Dosage Given: 5 Gy
Session Number: 8

## 2023-07-21 ENCOUNTER — Other Ambulatory Visit: Payer: Self-pay

## 2023-07-21 ENCOUNTER — Ambulatory Visit
Admission: RE | Admit: 2023-07-21 | Discharge: 2023-07-21 | Disposition: A | Discharge status: Home / Self Care | Payer: Medicare Other | Source: Ambulatory Visit | Attending: Radiation Oncology | Admitting: Radiation Oncology

## 2023-07-21 DIAGNOSIS — C3411 Malignant neoplasm of upper lobe, right bronchus or lung: Secondary | ICD-10-CM | POA: Diagnosis not present

## 2023-07-21 DIAGNOSIS — Z510 Encounter for antineoplastic radiation therapy: Secondary | ICD-10-CM | POA: Diagnosis not present

## 2023-07-21 LAB — RAD ONC ARIA SESSION SUMMARY
Course Elapsed Days: 10
Plan Fractions Treated to Date: 9
Plan Prescribed Dose Per Fraction: 5 Gy
Plan Total Fractions Prescribed: 10
Plan Total Prescribed Dose: 50 Gy
Reference Point Dosage Given to Date: 45 Gy
Reference Point Session Dosage Given: 5 Gy
Session Number: 9

## 2023-07-22 ENCOUNTER — Ambulatory Visit
Admission: RE | Admit: 2023-07-22 | Discharge: 2023-07-22 | Disposition: A | Discharge status: Home / Self Care | Payer: Medicare Other | Source: Ambulatory Visit | Attending: Radiation Oncology | Admitting: Radiation Oncology

## 2023-07-22 ENCOUNTER — Other Ambulatory Visit: Payer: Self-pay

## 2023-07-22 DIAGNOSIS — Z510 Encounter for antineoplastic radiation therapy: Secondary | ICD-10-CM | POA: Diagnosis not present

## 2023-07-22 DIAGNOSIS — C3411 Malignant neoplasm of upper lobe, right bronchus or lung: Secondary | ICD-10-CM | POA: Diagnosis not present

## 2023-07-22 LAB — RAD ONC ARIA SESSION SUMMARY
Course Elapsed Days: 11
Plan Fractions Treated to Date: 10
Plan Prescribed Dose Per Fraction: 5 Gy
Plan Total Fractions Prescribed: 10
Plan Total Prescribed Dose: 50 Gy
Reference Point Dosage Given to Date: 50 Gy
Reference Point Session Dosage Given: 5 Gy
Session Number: 10

## 2023-07-25 NOTE — Radiation Completion Notes (Signed)
Patient Name: Carol Keith, Carol Keith MRN: [id number] Date of Birth: May 19, 1951 Referring Physician: Charlett Lango, M.D. Date of Service: 2023-07-25 Radiation Oncologist: Arnette Schaumann, M.D. [facility]                             RADIATION ONCOLOGY END OF TREATMENT NOTE     Diagnosis: C34.11 Malignant neoplasm of upper lobe, right bronchus or lung Staging on 2023-05-25: Adenocarcinoma of upper lobe of right lung (HCC) T=cT2a, N=cN0, M=cM0 Intent: Curative     ==========DELIVERED PLANS==========  First Treatment Date: 2023-07-11 - Last Treatment Date: 2023-07-22   Plan Name: Lung_R_UHRT Site: Lung, Right Technique: IMRT Mode: Photon Dose Per Fraction: 5 Gy Prescribed Dose (Delivered / Prescribed): 50 Gy / 50 Gy Prescribed Fxs (Delivered / Prescribed): 10 / 10     ==========ON TREATMENT VISIT DATES========== 2023-07-12, 2023-07-19     ==========UPCOMING VISITS==========       ==========APPENDIX - ON TREATMENT VISIT NOTES==========   See weekly On Treatment Notes in Epic for details.

## 2023-08-05 ENCOUNTER — Encounter: Payer: Self-pay | Admitting: Genetic Counselor

## 2023-08-05 ENCOUNTER — Ambulatory Visit: Payer: Self-pay | Admitting: Genetic Counselor

## 2023-08-05 ENCOUNTER — Telehealth: Payer: Self-pay | Admitting: Genetic Counselor

## 2023-08-05 DIAGNOSIS — Z299 Encounter for prophylactic measures, unspecified: Secondary | ICD-10-CM | POA: Diagnosis not present

## 2023-08-05 DIAGNOSIS — E1169 Type 2 diabetes mellitus with other specified complication: Secondary | ICD-10-CM | POA: Diagnosis not present

## 2023-08-05 DIAGNOSIS — N183 Chronic kidney disease, stage 3 unspecified: Secondary | ICD-10-CM | POA: Diagnosis not present

## 2023-08-05 DIAGNOSIS — I10 Essential (primary) hypertension: Secondary | ICD-10-CM | POA: Diagnosis not present

## 2023-08-05 DIAGNOSIS — Z1501 Genetic susceptibility to malignant neoplasm of breast: Secondary | ICD-10-CM | POA: Insufficient documentation

## 2023-08-05 DIAGNOSIS — Z1379 Encounter for other screening for genetic and chromosomal anomalies: Secondary | ICD-10-CM

## 2023-08-05 DIAGNOSIS — M549 Dorsalgia, unspecified: Secondary | ICD-10-CM | POA: Diagnosis not present

## 2023-08-05 DIAGNOSIS — C3491 Malignant neoplasm of unspecified part of right bronchus or lung: Secondary | ICD-10-CM | POA: Diagnosis not present

## 2023-08-05 NOTE — Progress Notes (Signed)
HPI:   Carol Keith was previously seen in the [facility] due to a personal and family history of cancer and concerns regarding a hereditary predisposition to cancer. Please refer to our prior cancer genetics clinic note for more information regarding our discussion, assessment and recommendations, at the time. Carol Keith recent genetic test results were disclosed to her, as were recommendations warranted by these results. These results and recommendations are discussed in more detail below.  CANCER HISTORY:  Oncology History  Adenocarcinoma of upper lobe of right lung (HCC)  05/25/2023 Initial Diagnosis   Adenocarcinoma of upper lobe of right lung (HCC)   05/25/2023 Cancer Staging   Staging form: Lung, AJCC 8th Edition - Clinical: Stage IB (cT2a, cN0, cM0) - Signed by Si Gaul, MD on 05/25/2023     FAMILY HISTORY:  We obtained a detailed, 4-generation family history.  Significant diagnoses are listed below:      Family History  Problem Relation Age of Onset   Brain cancer Father 15   Breast cancer Sister 32        contralateral breast cancer dx. 48   Colon cancer Sister 51   Breast cancer Maternal Aunt     Colon cancer Maternal Aunt     Colon cancer Maternal Aunt     Breast cancer Daughter 42        BRCA1+   Ovarian cancer Niece 40           Ms. Carol Keith's daughter was diagnosed with breast cancer at age 72 and she has a BRCA1 gene mutation [phone]). Her sister was diagnosed with breast cancer at age 84, contralateral breast cancer at age 83, and colon cancer at age 42. Her niece (daughter of one of her brothers) was diagnosed with ovarian cancer at age 88. Carol Keith has 6 maternal aunts, one was diagnosed with breast cancer and two were diagnosed with colon cancer, they are all deceased. Her father was diagnosed with brain cancer at age 30, he died at age 41. There is no reported Ashkenazi Jewish ancestry.  GENETIC TEST RESULTS:  Ms.  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 Specifically, this variant is [id number].  The test report has been scanned into EPIC and is located under the Molecular Pathology section of the Results Review tab.  A portion of the result report is included below for reference. Genetic testing reported out on 08/03/2023.       Clinical Information: Hereditary breast and ovarian cancer (HBOC) syndrome is characterized by an increased lifetime risk for, generally, adult-onset cancers including, breast, contralateral breast, female breast, ovarian, prostate, melanoma and pancreatic.  The cancers associated with BRCA1 are:  Female breast cancer, up to a 78% risk Up to 73% of the breast cancers diagnosed in women with BRCA1 mutations are triple negative breast cancer In women with a history of breast cancer, the cumulative risk for contralateral breast cancer 20 years after breast cancer diagnosis is 30-40%. Female breast cancer, up to a 2% risk Ovarian cancer, 39-58% risk Pancreatic cancer, 3-5% risk Prostate cancer, 7-26% risk Limited data suggest there may be a slightly increased risk of serous uterine cancer    Management Recommendations:  Breast Screening/Risk Reduction:  Women: Breast awareness starting at age 84 Clinical breast examination every 6-12 months starting at age 60  Breast cancer screening: Age 59-29 years, annual breast MRI with and without contrast (or mammogram, if MRI is unavailable), although the  age to initiate screening may be individualized based on family history Age 57-75 years, annual mammogram and breast MRI with and without contrast Age >75 years, management should be considered on an individual basis For women with a BRCA1 pathogenic or likely pathogenic variant who are treated for breast cancer and have not had a bilateral mastectomy, screening with annual mammogram and breast MRI should continue as  described above. The option of prophylactic bilateral risk-reducing mastectomy (RRM), removal of the breast tissue before cancer develops, is the best option for significantly decreasing the risk of developing breast cancer. Studies have shown mastectomies reduce the risk of breast cancer by 90-95% in women with a BRCA1 mutation.   Males: Breast self-exam training and education starting at age 16 years Annual clinical breast exam starting at age 50 years  Consider annual mammogram starting at age 49 or 10 years before the earliest known female breast cancer in the family (whichever comes first).   Gynecological Cancer Screening/Risk Reduction: It is recommended that women with a BRCA1 mutation have a risk-reducing salpingo oophorectomy (RRSO), removal of the ovaries and fallopian tubes, between the ages of 53-40 or once childbearing is completed. Having a RRSO is estimated to reduce the risk of ovarian cancer by up to 96%. There is still a small risk of developing an "ovarian-like" cancer in the lining of the abdomen, called the peritoneum. Another benefit to having the ovaries removed is the risk reduction for breast cancer. If the ovaries are removed before menopause, the risk of developing breast cancer is reduced. Women undergoing a RRSO should be aware of the potential risks and benefits of concurrent hysterectomy. Hormone replacement therapy could be considered based on the physician's discretion. Ovarian cancer screening is an option for women who chose not to have a RRSO or who have not yet completed their family. Current screening methods for ovarian cancer are neither sensitive nor specific, meaning that often early stage ovarian cancer cannot be diagnosed through this screening.  Screening can also be falsely positive with no cancer present. For this reason, RRSO is recommended over screening. If ovarian cancer screening is recommended by a physician, it could include: CA-125 blood  tests Transvaginal ultrasounds Clinical pelvic exams   Prostate Cancer Screening: Consider annual digital rectal exam (DRE) at age 23 Consider annual PSA blood test at age 30  Pancreatic Cancer Screening/Risk Reduction: Avoid smoking, heavy alcohol use, and obesity. It has been suggested that pancreatic cancer screening be limited to those with a family history of pancreatic cancer (first- or second-degree relative). Ideally, screening should be performed in experienced centers utilizing a multidisciplinary approach under research conditions. Recommended screening could include annual endoscopic ultrasound (preferred) and/or MRI of the pancreas starting at age 30 or 67 years younger than the earliest age diagnosis in the family.  Additional Considerations: Individuals at risk for developing breast and ovarian cancer may benefit from the use of medication to reduce their risk for cancer. These medications are referred to as chemoprevention. For example, oral contraceptive use has been shown to reduce the risk of ovarian cancer by approximately 60% in BRCA1 mutation carriers if taken for at least 5 years. This risk reduction remains even after discontinuation of oral contraceptives. Recent studies have suggested PARP inhibitors may be a beneficial chemotherapeutic agent for a subset of patients with BRCA1-associated breast, ovarian, prostate, and pancreatic cancers. Clinical trials are currently in process to determine if and how these agents can be useful in the treatment of BRCA1 cancer patients.  Patients of reproductive age should be made aware of options for prenatal diagnosis and assisted reproduction including pre-implantation genetic diagnosis. Individuals with a single pathogenic BRCA1 variant are carriers of Fanconi anemia. Fanconi anemia is characterized by developmental delay apparent from infancy, short stature, microcephaly, and coarse dysmorphic features. For there to be a risk of Fanconi  anemia in offspring, both the patient and their partner would each have to carry a pathogenic variant in BRCA1. In this case, the risk of having an affected child is 25%.   This information is based on current understanding of the gene and may change in the future.   Implications for Family Members: Hereditary predisposition to cancer due to pathogenic variants in the BRCA1 gene has autosomal dominant inheritance. This means that an individual with a pathogenic variant has a 50% chance of passing the condition on to his/her offspring. Identification of a pathogenic variant allows for the recognition of at-risk relatives who can pursue testing for the familial variant.  Family members are encouraged to consider genetic testing for this familial pathogenic variant. As there are generally no childhood cancer risks associated with a single pathogenic variant in the BRCA1 gene, individuals in the family are not recommended to have testing until they reach at least 74 years of age. They may contact our office at [phone] for more information or to schedule an appointment.  Complimentary testing for the familial variant is available for 90 days after the genetic testing report date.  Family members who live outside of the area are encouraged to find a genetic counselor in their area by visiting: [email or web address].  Resources: FORCE (Facing Our Risk of Cancer Empowered) is a resource for those with a hereditary predisposition to develop cancer.  FORCE provides information about risk reduction, advocacy, legislation, and clinical trials.  Additionally, [employer]CE provides a platform for collaboration and support; which includes: peer navigation, message boards, local support groups, a toll-free helpline, research registry and recruitment, advocate training, published medical research, webinars, brochures, mastectomy photos, and more.  For more information, visit www.facingourrisk.org  Our  contact number was provided. Ms. Bartron questions were answered to her satisfaction, and she knows she is welcome to call us at anytime with additional questions or concerns.   Lalla Brothers, MS, [facility] Genetic Counselor [email or web address] (P) [phone]

## 2023-08-05 NOTE — Telephone Encounter (Signed)
I contacted Ms. Radel to discuss her genetic testing results. A single pathogenic variant was identified in the BRCA1 gene ([id number]). Detailed clinic note to follow.  The test report has been scanned into EPIC and is located under the Molecular Pathology section of the Results Review tab.  A portion of the result report is included below for reference.   Lalla Brothers, MS, [facility] Genetic Counselor [email or web address] (P) [phone]

## 2023-08-19 ENCOUNTER — Encounter: Payer: Self-pay | Admitting: Genetic Counselor

## 2023-08-21 ENCOUNTER — Ambulatory Visit
Admission: EM | Admit: 2023-08-21 | Discharge: 2023-08-21 | Disposition: A | Discharge status: Home / Self Care | Payer: Medicare Other | Attending: Nurse Practitioner | Admitting: Nurse Practitioner

## 2023-08-21 DIAGNOSIS — R197 Diarrhea, unspecified: Secondary | ICD-10-CM | POA: Diagnosis not present

## 2023-08-21 LAB — POCT URINALYSIS DIP (MANUAL ENTRY)
Bilirubin, UA: NEGATIVE
Glucose, UA: NEGATIVE mg/dL
Ketones, POC UA: NEGATIVE mg/dL
Leukocytes, UA: NEGATIVE
Nitrite, UA: NEGATIVE
Protein Ur, POC: 100 mg/dL — AB
Spec Grav, UA: 1.015 (ref 1.010–1.025)
Urobilinogen, UA: 0.2 U/dL
pH, UA: 5.5 (ref 5.0–8.0)

## 2023-08-21 MED ORDER — LOPERAMIDE HCL 2 MG PO CAPS: 2.0000 mg | ORAL_CAPSULE | Freq: Four times a day (QID) | ORAL | 0 refills | Status: AC | PRN

## 2023-08-21 NOTE — ED Provider Notes (Signed)
[facility]    CSN: [id number] Arrival date & time: 08/21/23  1058      History   Chief Complaint Chief Complaint  Patient presents with   Diarrhea    HPI Carol Keith is a 72 y.o. female.   The history is provided by the patient.   Patient presents for complaints of diarrhea and nausea that been present for the past 4 days.  Patient states she is experienced at least 4 episodes of diarrhea this morning, with 4 episodes last evening.  She also endorses mild nausea with her symptoms.  Patient denies fever, chills, chest pain, abdominal pain, vomiting, constipation, bloody stools, melena stools, gas, bloating, or urinary symptoms.  Patient reports that she currently completed radiation for lung cancer on 10/9, and is concerned whether or not this may be a reaction of the radiation.  Patient reports she has also been taking Zofran to help with her nausea.  She reports she is able to eat, but patient states "the food just runs right through me.".  Past Medical History:  Diagnosis Date   Allergic rhinitis    Arthritis    kneees, back    Breast cancer (HCC)    Bursitis of right hip    Cancer (HCC)    breast cancer, right   Cerumen impaction    Chronic kidney disease    appt. schedule for Dr. 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   Dizziness    Fatigue    Frequent urination    Gout    Heart murmur    Echo11/21/16 ([facility]): LVEF 50-55%, reduced LV diastolic function, mild TR    Hypercholesteremia    Hypertension    Hypokalemia    Left foot pain    Left shoulder pain    Lumbago    Oral candida    Ovarian failure    Plantar fascial fibromatosis    PONV (postoperative nausea and vomiting)    Proteinuria    Right knee pain     Patient Active Problem List   Diagnosis Date Noted   Genetic testing 08/05/2023   BRCA1 gene mutation positive 08/05/2023   Adenocarcinoma of upper  lobe of right lung (HCC) 05/25/2023   Lung mass 04/29/2023   Osteoarthritis of left knee 01/24/2017    Past Surgical History:  Procedure Laterality Date   ABDOMINAL HYSTERECTOMY     complete   APPENDECTOMY     BILIARY BRUSHING  02/07/2019   Procedure: BILIARY BRUSHING;  Surgeon: Lemar Lofty., MD;  Location: [facility] ENDOSCOPY;  Service: Gastroenterology;;   BILIARY BRUSHING  07/04/2019   Procedure: BILIARY BRUSHING;  Surgeon: Lemar Lofty., MD;  Location: [facility] ENDOSCOPY;  Service: Gastroenterology;;   BILIARY DILATION  02/07/2019   Procedure: BILIARY DILATION;  Surgeon: Lemar Lofty., MD;  Location: [facility] ENDOSCOPY;  Service: Gastroenterology;;   BILIARY DILATION  07/04/2019   Procedure: BILIARY DILATION;  Surgeon: Lemar Lofty., MD;  Location: [facility] ENDOSCOPY;  Service: Gastroenterology;;   BIOPSY  02/07/2019   Procedure: BIOPSY;  Surgeon: Lemar Lofty., MD;  Location: [facility] ENDOSCOPY;  Service: Gastroenterology;;   BIOPSY  07/04/2019   Procedure: BIOPSY;  Surgeon: Lemar Lofty., MD;  Location: [facility] ENDOSCOPY;  Service: Gastroenterology;;   BREAST LUMPECTOMY Right 2015   BREAST SURGERY Right    for Breast Ca, tx with surg. & chemo & radiation  BRONCHIAL BIOPSY  05/09/2023   Procedure: BRONCHIAL BIOPSIES;  Surgeon: Leslye Peer, MD;  Location: [facility] ENDOSCOPY;  Service: Pulmonary;;   BRONCHIAL BRUSHINGS  05/09/2023   Procedure: BRONCHIAL BRUSHINGS;  Surgeon: Leslye Peer, MD;  Location: [facility] ENDOSCOPY;  Service: Pulmonary;;   BRONCHIAL NEEDLE ASPIRATION BIOPSY  05/09/2023   Procedure: BRONCHIAL NEEDLE ASPIRATION BIOPSIES;  Surgeon: Leslye Peer, MD;  Location: [facility] ENDOSCOPY;  Service: Pulmonary;;   CATARACT EXTRACTION W/PHACO Left 08/06/2019   Procedure: CATARACT EXTRACTION PHACO AND INTRAOCULAR LENS PLACEMENT LEFT EYE;  Surgeon: Fabio Pierce, MD;  Location: [facility] ORS;  Service: Ophthalmology;  Laterality: Left;  CDE: 9.01   CATARACT  EXTRACTION W/PHACO Right 08/20/2019   Procedure: CATARACT EXTRACTION PHACO AND INTRAOCULAR LENS PLACEMENT RIGHT EYE CDE=5.88;  Surgeon: Fabio Pierce, MD;  Location: [facility];  Service: Ophthalmology;  Laterality: Right;  right   CHOLECYSTECTOMY     COLONOSCOPY     ENDOSCOPIC RETROGRADE CHOLANGIOPANCREATOGRAPHY (ERCP) WITH PROPOFOL N/A 07/04/2019   Procedure: ENDOSCOPIC RETROGRADE CHOLANGIOPANCREATOGRAPHY (ERCP) WITH PROPOFOL;  Surgeon: Meridee Score Netty Starring., MD;  Location: [facility] ENDOSCOPY;  Service: Gastroenterology;  Laterality: N/A;   ERCP N/A 02/07/2019   Procedure: ENDOSCOPIC RETROGRADE CHOLANGIOPANCREATOGRAPHY (ERCP);  Surgeon: Lemar Lofty., MD;  Location: [facility] ENDOSCOPY;  Service: Gastroenterology;  Laterality: N/A;   ESOPHAGOGASTRODUODENOSCOPY (EGD) WITH PROPOFOL N/A 02/07/2019   Procedure: ESOPHAGOGASTRODUODENOSCOPY (EGD) WITH PROPOFOL;  Surgeon: Meridee Score Netty Starring., MD;  Location: [facility] ENDOSCOPY;  Service: Gastroenterology;  Laterality: N/A;   ESOPHAGOGASTRODUODENOSCOPY (EGD) WITH PROPOFOL N/A 07/04/2019   Procedure: ESOPHAGOGASTRODUODENOSCOPY (EGD) WITH PROPOFOL;  Surgeon: Meridee Score Netty Starring., MD;  Location: [facility] ENDOSCOPY;  Service: Gastroenterology;  Laterality: N/A;   EUS N/A 07/04/2019   Procedure: UPPER ENDOSCOPIC ULTRASOUND (EUS) RADIAL;  Surgeon: Lemar Lofty., MD;  Location: [facility] ENDOSCOPY;  Service: Gastroenterology;  Laterality: N/A;   JOINT REPLACEMENT Bilateral    knee   KNEE ARTHROPLASTY Right 05/17/2016   Procedure: RIGHT TOTAL KNEE ARTHROPLASTY WITH COMPUTER NAVIGATION;  Surgeon: Samson Frederic, MD;  Location: [facility] OR;  Service: Orthopedics;  Laterality: Right;  Needs RNFA   KNEE ARTHROPLASTY Left 01/24/2017   Procedure: LEFT TOTAL KNEE ARTHROPLASTY WITH COMPUTER NAVIGATION;  Surgeon: Samson Frederic, MD;  Location: [facility] OR;  Service: Orthopedics;  Laterality: Left;  Dr. requesting RNFA   LAPAROSCOPY  1999   done at [facility], "something with separating my  organs because things had grown together"   REMOVAL OF STONES  02/07/2019   Procedure: REMOVAL OF STONES;  Surgeon: Lemar Lofty., MD;  Location: [facility] ENDOSCOPY;  Service: Gastroenterology;;   Dennison Mascot  02/07/2019   Procedure: Dennison Mascot;  Surgeon: Lemar Lofty., MD;  Location: [facility] ENDOSCOPY;  Service: Gastroenterology;;   TONSILLECTOMY     TUBAL LIGATION     UPPER ESOPHAGEAL ENDOSCOPIC ULTRASOUND (EUS)  02/07/2019   Procedure: UPPER ESOPHAGEAL ENDOSCOPIC ULTRASOUND (EUS);  Surgeon: Lemar Lofty., MD;  Location: [facility] ENDOSCOPY;  Service: Gastroenterology;;   VIDEO BRONCHOSCOPY WITH RADIAL ENDOBRONCHIAL ULTRASOUND  05/09/2023   Procedure: VIDEO BRONCHOSCOPY WITH RADIAL ENDOBRONCHIAL ULTRASOUND;  Surgeon: Leslye Peer, MD;  Location: [facility] ENDOSCOPY;  Service: Pulmonary;;    OB History   No obstetric history on file.      Home Medications    Prior to Admission medications   Medication Sig Start Date End Date Taking? Authorizing Provider  loperamide (IMODIUM) 2 MG capsule Take 1 capsule (2 mg total) by mouth 4 (four) times daily as needed for diarrhea or loose stools. 08/21/23  Yes Leath-Warren, Lorene Dy  J, NP  allopurinol (ZYLOPRIM) 100 MG tablet Take 100 mg by mouth daily.     [provider]  amLODipine (NORVASC) 5 MG tablet Take 5 mg by mouth daily. 12/25/18   [provider]  ascorbic acid (VITAMIN C) 500 MG tablet Take 500 mg by mouth daily.    [provider]  aspirin EC 81 MG tablet Take 81 mg by mouth daily.    [provider]  bumetanide (BUMEX) 1 MG tablet Take 1 mg by mouth daily.    [provider]  Camphor-Menthol-Methyl Sal (SALONPAS) 3.10-16-08 % PTCH Place 1 patch onto the skin daily as needed (pain).    [provider]  cetirizine (ZYRTEC) 10 MG tablet Take 10 mg by mouth daily.    [provider]  Cholecalciferol (VITAMIN D3) 1.25 MG (50000 UT) CAPS Take 1 capsule by mouth once  a week.    [provider]  Cyanocobalamin (VITAMIN B-12) 2500 MCG SUBL Place 2,500 mcg under the tongue daily.    [provider]  diclofenac sodium (VOLTAREN) 1 % GEL Apply 2 g topically 2 (two) times daily as needed (pain).    [provider]  doxazosin (CARDURA XL) 4 MG 24 hr tablet Take 4 mg by mouth at bedtime.    [provider]  Exenatide ER (BYDUREON BCISE) 2 MG/0.85ML AUIJ Inject 2 mg into the skin once a week.    [provider]  glimepiride (AMARYL) 4 MG tablet Take 4 mg by mouth daily with breakfast.     [provider]  glucose blood test strip 1 each by Other route as needed for other. Use as instructed    [provider]  Insulin Pen Needle (NOVOFINE) 32G X 6 MM MISC by Does not apply route.    [provider]  losartan (COZAAR) 100 MG tablet Take 100 mg by mouth daily.    [provider]  meclizine (ANTIVERT) 25 MG tablet Take 25 mg by mouth 2 (two) times daily as needed for dizziness. 03/09/22   [provider]  metoprolol succinate (TOPROL-XL) 100 MG 24 hr tablet Take 100 mg by mouth daily with breakfast. Take with or immediately following a meal.     [provider]  pantoprazole (PROTONIX) 40 MG tablet Take 40 mg by mouth daily before breakfast.    [provider]  Polyethyl Glycol-Propyl Glycol (LUBRICANT EYE DROPS) 0.4-0.3 % SOLN Place 1 drop into both eyes 3 (three) times daily as needed (dry/irritated eyes.). Systane    [provider]  polyethylene glycol (MIRALAX / GLYCOLAX) 17 g packet Take 17 g by mouth daily as needed (constipation.).    [provider]  potassium chloride (K-DUR) 10 MEQ tablet Take 20 mEq by mouth daily.  12/25/18   [provider]  rosuvastatin (CRESTOR) 10 MG tablet Take 10 mg by mouth at bedtime.     [provider]  tiZANidine (ZANAFLEX) 4 MG tablet Take 4 mg by mouth every 8 (eight) hours as needed for muscle  spasms.     [provider]  TOUJEO SOLOSTAR 300 UNIT/ML SOPN Inject 45 Units into the skin at bedtime. 01/31/19   [provider]    Family History Family History  Problem Relation Age of Onset   Brain cancer Father 69   Breast cancer Sister 32       contralateral breast cancer dx. 48   Colon cancer Sister 40   Breast cancer Maternal Aunt  Colon cancer Maternal Aunt    Colon cancer Maternal Aunt    Breast cancer Daughter 4       BRCA1+   Ovarian cancer Niece 39    Social History Social History   Tobacco Use   Smoking status: Never   Smokeless tobacco: Never  Vaping Use   Vaping status: Never Used  Substance Use Topics   Alcohol use: No   Drug use: No     Allergies   Ace inhibitors, Actos [pioglitazone], Lipitor [atorvastatin], and Metformin and related   Review of Systems Review of Systems Per HPI  Physical Exam Triage Vital Signs ED Triage Vitals  Encounter Vitals Group     BP 08/21/23 1106 (!) 159/80     Systolic BP Percentile --      Diastolic BP Percentile --      Pulse Rate 08/21/23 1106 89     Resp 08/21/23 1106 18     Temp 08/21/23 1106 97.8 F (36.6 C)     Temp Source 08/21/23 1106 Oral     SpO2 08/21/23 1106 97 %     Weight --      Height --      Head Circumference --      Peak Flow --      Pain Score 08/21/23 1107 0     Pain Loc --      Pain Education --      Exclude from Growth Chart --    No data found.  Updated Vital Signs BP (!) 159/80 (BP Location: Right Arm)   Pulse 89   Temp 97.8 F (36.6 C) (Oral)   Resp 18   SpO2 97%   Visual Acuity Right Eye Distance:   Left Eye Distance:   Bilateral Distance:    Right Eye Near:   Left Eye Near:    Bilateral Near:     Physical Exam Vitals and nursing note reviewed.  Constitutional:      General: She is not in acute distress.    Appearance: Normal appearance.  HENT:     Head: Normocephalic.  Eyes:     Extraocular Movements: Extraocular movements intact.      Pupils: Pupils are equal, round, and reactive to light.  Cardiovascular:     Rate and Rhythm: Regular rhythm.     Pulses: Normal pulses.     Heart sounds: Normal heart sounds.  Pulmonary:     Effort: Pulmonary effort is normal. No respiratory distress.     Breath sounds: Normal breath sounds. No stridor. No wheezing, rhonchi or rales.  Abdominal:     General: Bowel sounds are normal. There is no distension.     Palpations: Abdomen is soft.     Tenderness: There is no abdominal tenderness. There is no guarding or rebound.  Musculoskeletal:     Cervical back: Normal range of motion.  Lymphadenopathy:     Cervical: No cervical adenopathy.  Skin:    General: Skin is warm and dry.  Neurological:     General: No focal deficit present.     Mental Status: She is alert and oriented to person, place, and time.  Psychiatric:        Mood and Affect: Mood normal.        Behavior: Behavior normal.     UC Treatments / Results  Labs (all labs ordered are listed, but only abnormal results are displayed) Labs Reviewed  POCT URINALYSIS DIP (MANUAL ENTRY) - Abnormal; Notable for the following  components:      Result Value   Blood, UA trace-intact (*)    Protein Ur, POC =100 (*)    All other components within normal limits    EKG   Radiology No results found.  Procedures Procedures (including critical care time)  Medications Ordered in UC Medications - No data to display  Initial Impression / Assessment and Plan / UC Course  I have reviewed the triage vital signs and the nursing notes.  Pertinent labs & imaging results that were available during my care of the patient were reviewed by me and considered in my medical decision making (see chart for details).  On exam, patient's vital signs are stable, she is in no acute distress.  She does not have any abdominal tenderness on exam.  Do not suspect acute abdomen at this time.  Urinalysis was performed which shows she is not  dehydrated. Will have patient continue Zofran as needed for nausea.  Imodium A-D 2 mg prescribed for diarrhea.  Discussed side effects and safe use of medication with the patient.  Patient was also given information regarding foods to help decrease diarrhea.  Supportive care recommendations were provided and discussed with the patient to include increasing fluids, over-the-counter Tylenol for pain or discomfort, and Pedialyte or Gatorade light as needed to help with possible dehydration.  Patient advised to follow-up with her oncologist tomorrow as scheduled.  Patient was also given strict ER follow-up precautions.  Patient is in agreement with this plan of care and verbalized understanding.  All questions were answered.  Patient stable for discharge.   Final Clinical Impressions(s) / UC Diagnoses   Final diagnoses:  Diarrhea, unspecified type     Discharge Instructions      Take medication as prescribed.  Continue Zofran as prescribed for nausea. Make sure you are drinking at least 8-10 8 ounce glasses of water while symptoms persist.  You can also drink Pedialyte or Gatorolyte to prevent dehydration. May take over-the-counter Tylenol as needed for pain, fever, or general discomfort. While you are experiencing nausea, please consider a brat diet to include bananas, rice, applesauce, and toast.  I have provided information for food choices that you can select to help decrease the diarrhea. If you experience worsening diarrhea with abdominal pain, fever, chills, please go to the emergency department immediately for further evaluation. Follow-up with oncologist as scheduled. Follow-up as needed.     ED Prescriptions     Medication Sig Dispense Auth. Provider   loperamide (IMODIUM) 2 MG capsule Take 1 capsule (2 mg total) by mouth 4 (four) times daily as needed for diarrhea or loose stools. 12 capsule Leath-Warren, Sadie Haber, NP      PDMP not reviewed this encounter.   Abran Cantor, NP 08/21/23 [phone]

## 2023-08-21 NOTE — Discharge Instructions (Addendum)
Take medication as prescribed.  Continue Zofran as prescribed for nausea. Make sure you are drinking at least 8-10 8 ounce glasses of water while symptoms persist.  You can also drink Pedialyte or Gatorolyte to prevent dehydration. May take over-the-counter Tylenol as needed for pain, fever, or general discomfort. While you are experiencing nausea, please consider a brat diet to include bananas, rice, applesauce, and toast.  I have provided information for food choices that you can select to help decrease the diarrhea. If you experience worsening diarrhea with abdominal pain, fever, chills, please go to the emergency department immediately for further evaluation. Follow-up with oncologist as scheduled. Follow-up as needed.

## 2023-08-21 NOTE — ED Triage Notes (Signed)
Pt reports she is having some diarrhea x 4 days  Wants to know if she is having a delayed reaction from her radiation treatment that ended 10/9.  Reports slight nasuea before diarrhea onset

## 2023-08-22 ENCOUNTER — Ambulatory Visit
Admission: RE | Admit: 2023-08-22 | Discharge: 2023-08-22 | Disposition: A | Discharge status: Home / Self Care | Payer: Medicare Other | Source: Ambulatory Visit | Attending: Radiation Oncology | Admitting: Radiation Oncology

## 2023-08-22 ENCOUNTER — Encounter: Payer: Self-pay | Admitting: Radiation Oncology

## 2023-08-22 ENCOUNTER — Other Ambulatory Visit: Payer: Self-pay

## 2023-08-22 VITALS — BP 136/71 | HR 73 | Temp 97.5°F | Resp 18 | Ht 63.0 in | Wt 191.2 lb

## 2023-08-22 DIAGNOSIS — Z7985 Long-term (current) use of injectable non-insulin antidiabetic drugs: Secondary | ICD-10-CM | POA: Insufficient documentation

## 2023-08-22 DIAGNOSIS — Z7984 Long term (current) use of oral hypoglycemic drugs: Secondary | ICD-10-CM | POA: Diagnosis not present

## 2023-08-22 DIAGNOSIS — Z7982 Long term (current) use of aspirin: Secondary | ICD-10-CM | POA: Insufficient documentation

## 2023-08-22 DIAGNOSIS — C3411 Malignant neoplasm of upper lobe, right bronchus or lung: Secondary | ICD-10-CM | POA: Diagnosis not present

## 2023-08-22 DIAGNOSIS — Z923 Personal history of irradiation: Secondary | ICD-10-CM | POA: Diagnosis not present

## 2023-08-22 DIAGNOSIS — Z79899 Other long term (current) drug therapy: Secondary | ICD-10-CM | POA: Insufficient documentation

## 2023-08-22 HISTORY — DX: Personal history of irradiation: Z92.3

## 2023-08-22 NOTE — Progress Notes (Signed)
Radiation Oncology         [phone] ________________________________  Name: Carol Keith MRN: [id number]  Date: 08/22/2023  DOB: 03/30/1951  Follow-Up Visit Note  CC: Kirstie Peri, MD  Loreli Slot, *    ICD-10-CM   1. 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  She reports feeling well at this time.  She denies any pain within the chest area significant cough or hemoptysis.  Her energy level is good.                              ALLERGIES:  is allergic to ace inhibitors, actos [pioglitazone], lipitor [atorvastatin], and metformin and related.  Meds: Current Outpatient Medications  Medication Sig Dispense Refill   allopurinol (ZYLOPRIM) 100 MG tablet Take 100 mg by mouth daily.      amLODipine (NORVASC) 5 MG tablet Take 5 mg by mouth daily.     ascorbic acid (VITAMIN C) 500 MG tablet Take 500 mg by mouth daily.     aspirin EC 81 MG tablet Take 81 mg by mouth daily.     bumetanide (BUMEX) 1 MG tablet Take 1 mg by mouth daily.     Camphor-Menthol-Methyl Sal (SALONPAS) 3.10-16-08 % PTCH Place 1 patch onto the skin daily as needed (pain).     cetirizine (ZYRTEC) 10 MG tablet Take 10 mg by mouth daily.     Cholecalciferol (VITAMIN D3) 1.25 MG (50000 UT) CAPS Take 1 capsule by mouth once a week.     Cyanocobalamin (VITAMIN B-12) 2500 MCG SUBL Place 2,500 mcg under the tongue daily.     diclofenac sodium (VOLTAREN) 1 % GEL Apply 2 g topically 2 (two) times daily as needed (pain).     doxazosin (CARDURA XL) 4 MG 24 hr tablet Take 4 mg by mouth at bedtime.     Exenatide ER (BYDUREON BCISE) 2 MG/0.85ML AUIJ Inject 2 mg into the skin once a week.     glimepiride (AMARYL) 4 MG tablet Take 4 mg by mouth daily with breakfast.      glucose blood test  strip 1 each by Other route as needed for other. Use as instructed     Insulin Pen Needle (NOVOFINE) 32G X 6 MM MISC by Does not apply route.     loperamide (IMODIUM) 2 MG capsule Take 1 capsule (2 mg total) by mouth 4 (four) times daily as needed for diarrhea or loose stools. 12 capsule 0   losartan (COZAAR) 100 MG tablet Take 100 mg by mouth daily.     meclizine (ANTIVERT) 25 MG tablet Take 25 mg by mouth 2 (two) times daily as needed for dizziness.     metoprolol succinate (TOPROL-XL) 100 MG 24 hr tablet Take 100 mg by mouth daily with breakfast. Take with or immediately following a meal.      pantoprazole (PROTONIX) 40 MG tablet Take 40 mg by mouth daily before breakfast.     Polyethyl Glycol-Propyl Glycol (LUBRICANT EYE DROPS) 0.4-0.3 % SOLN Place 1 drop into both eyes 3 (three) times daily as needed (dry/irritated eyes.). Systane     polyethylene glycol (MIRALAX / GLYCOLAX) 17 g packet Take 17 g by mouth  daily as needed (constipation.).     potassium chloride (K-DUR) 10 MEQ tablet Take 20 mEq by mouth daily.      rosuvastatin (CRESTOR) 10 MG tablet Take 10 mg by mouth at bedtime.      tiZANidine (ZANAFLEX) 4 MG tablet Take 4 mg by mouth every 8 (eight) hours as needed for muscle spasms.      TOUJEO SOLOSTAR 300 UNIT/ML SOPN Inject 45 Units into the skin at bedtime.     No current facility-administered medications for this encounter.    Physical Findings: The patient is in no acute distress. Patient is alert and oriented.  height is 5\' 3"  (1.6 m) and weight is 191 lb 4 oz (86.8 kg). Her temporal temperature is 97.5 F (36.4 C) (abnormal). Her blood pressure is 136/71 and her pulse is 73. Her respiration is 18 and oxygen saturation is 100%. .  The lungs are clear.  The heart has a regular rhythm and rate.  No palpable cervical or supraclavicular adenopathy.  Lab Findings: Lab Results  Component Value Date   WBC 4.4 05/25/2023   HGB 10.4 (L) 05/25/2023   HCT 32.1 (L) 05/25/2023   MCV  87.9 05/25/2023   PLT 161 05/25/2023    Radiographic Findings: No results found.  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  She was treated with ultra hypofractionated accelerated radiation therapy completing 50 Gray in 10 fractions.  She tolerated this well without any lasting side effects.  Plan: Routine follow-up in 3 months.  Prior to this follow-up appointment she will have a CT scan of the chest.  ____________________________________ Antony Blackbird, MD

## 2023-08-22 NOTE — Progress Notes (Signed)
Carol Keith is here today for follow up post radiation to the lung.  Lung Side: Right, patient completed treatment on 07/22/23  Does the patient complain of any of the following: Pain: She denies pain Shortness of breath w/wo exertion: Denies Cough: No Hemoptysis: No Pain with swallowing: No Swallowing/choking concerns: No Appetite: Fair Energy Level: Fair Post radiation skin Changes: No  BP 136/71 (BP Location: Left Arm, Patient Position: Sitting)   Pulse 73   Temp (!) 97.5 F (36.4 C) (Temporal)   Resp 18   Ht 5\' 3"  (1.6 m)   Wt 191 lb 4 oz (86.8 kg)   SpO2 100%   BMI 33.88 kg/m      Additional comments if applicable:

## 2023-08-24 ENCOUNTER — Encounter: Payer: Self-pay | Admitting: Internal Medicine

## 2023-08-24 ENCOUNTER — Other Ambulatory Visit: Payer: Self-pay | Admitting: Internal Medicine

## 2023-08-24 DIAGNOSIS — Z1231 Encounter for screening mammogram for malignant neoplasm of breast: Secondary | ICD-10-CM

## 2023-09-06 DIAGNOSIS — M7051 Other bursitis of knee, right knee: Secondary | ICD-10-CM | POA: Diagnosis not present

## 2023-09-06 DIAGNOSIS — Z96651 Presence of right artificial knee joint: Secondary | ICD-10-CM | POA: Diagnosis not present

## 2023-09-06 DIAGNOSIS — M545 Low back pain, unspecified: Secondary | ICD-10-CM | POA: Diagnosis not present

## 2023-09-14 ENCOUNTER — Ambulatory Visit
Admission: RE | Admit: 2023-09-14 | Discharge: 2023-09-14 | Disposition: A | Discharge status: Home / Self Care | Payer: Medicare Other | Source: Ambulatory Visit | Attending: Internal Medicine | Admitting: Internal Medicine

## 2023-09-14 DIAGNOSIS — Z1231 Encounter for screening mammogram for malignant neoplasm of breast: Secondary | ICD-10-CM

## 2023-09-21 DIAGNOSIS — Z0000 Encounter for general adult medical examination without abnormal findings: Secondary | ICD-10-CM | POA: Diagnosis not present

## 2023-09-21 DIAGNOSIS — R52 Pain, unspecified: Secondary | ICD-10-CM | POA: Diagnosis not present

## 2023-09-21 DIAGNOSIS — E559 Vitamin D deficiency, unspecified: Secondary | ICD-10-CM | POA: Diagnosis not present

## 2023-09-21 DIAGNOSIS — Z299 Encounter for prophylactic measures, unspecified: Secondary | ICD-10-CM | POA: Diagnosis not present

## 2023-09-21 DIAGNOSIS — Z79899 Other long term (current) drug therapy: Secondary | ICD-10-CM | POA: Diagnosis not present

## 2023-09-21 DIAGNOSIS — R5383 Other fatigue: Secondary | ICD-10-CM | POA: Diagnosis not present

## 2023-09-21 DIAGNOSIS — E7800 Pure hypercholesterolemia, unspecified: Secondary | ICD-10-CM | POA: Diagnosis not present

## 2023-09-21 DIAGNOSIS — Z7189 Other specified counseling: Secondary | ICD-10-CM | POA: Diagnosis not present

## 2023-09-21 DIAGNOSIS — Z1331 Encounter for screening for depression: Secondary | ICD-10-CM | POA: Diagnosis not present

## 2023-09-21 DIAGNOSIS — Z1339 Encounter for screening examination for other mental health and behavioral disorders: Secondary | ICD-10-CM | POA: Diagnosis not present

## 2023-09-22 DIAGNOSIS — Z96651 Presence of right artificial knee joint: Secondary | ICD-10-CM | POA: Diagnosis not present

## 2023-09-28 DIAGNOSIS — Z96651 Presence of right artificial knee joint: Secondary | ICD-10-CM | POA: Diagnosis not present

## 2023-09-30 DIAGNOSIS — Z96651 Presence of right artificial knee joint: Secondary | ICD-10-CM | POA: Diagnosis not present

## 2023-10-03 DIAGNOSIS — Z96651 Presence of right artificial knee joint: Secondary | ICD-10-CM | POA: Diagnosis not present

## 2023-10-07 DIAGNOSIS — Z96651 Presence of right artificial knee joint: Secondary | ICD-10-CM | POA: Diagnosis not present

## 2023-10-10 DIAGNOSIS — I10 Essential (primary) hypertension: Secondary | ICD-10-CM | POA: Diagnosis not present

## 2023-10-10 DIAGNOSIS — Z96651 Presence of right artificial knee joint: Secondary | ICD-10-CM | POA: Diagnosis not present

## 2023-10-13 DIAGNOSIS — Z96651 Presence of right artificial knee joint: Secondary | ICD-10-CM | POA: Diagnosis not present

## 2023-10-18 ENCOUNTER — Telehealth: Payer: Self-pay | Admitting: *Deleted

## 2023-10-18 NOTE — Telephone Encounter (Signed)
 Called patient to inform of CT for 11-25-23 - arrival time- 3:30 pm @ [facility], no restrictions to scan, patient to receive results from Dr. Roselind Messier on 12-01-23 @ 9:45 am, spoke with patient and she is aware of these appts. and the instructions

## 2023-10-19 DIAGNOSIS — Z96651 Presence of right artificial knee joint: Secondary | ICD-10-CM | POA: Diagnosis not present

## 2023-10-20 DIAGNOSIS — M539 Dorsopathy, unspecified: Secondary | ICD-10-CM | POA: Diagnosis not present

## 2023-10-20 DIAGNOSIS — M5459 Other low back pain: Secondary | ICD-10-CM | POA: Diagnosis not present

## 2023-10-26 DIAGNOSIS — Z96651 Presence of right artificial knee joint: Secondary | ICD-10-CM | POA: Diagnosis not present

## 2023-11-09 DIAGNOSIS — I10 Essential (primary) hypertension: Secondary | ICD-10-CM | POA: Diagnosis not present

## 2023-11-10 DIAGNOSIS — M5459 Other low back pain: Secondary | ICD-10-CM | POA: Diagnosis not present

## 2023-11-10 DIAGNOSIS — M5416 Radiculopathy, lumbar region: Secondary | ICD-10-CM | POA: Diagnosis not present

## 2023-11-17 DIAGNOSIS — M48062 Spinal stenosis, lumbar region with neurogenic claudication: Secondary | ICD-10-CM | POA: Diagnosis not present

## 2023-11-17 DIAGNOSIS — M5416 Radiculopathy, lumbar region: Secondary | ICD-10-CM | POA: Diagnosis not present

## 2023-11-18 DIAGNOSIS — N183 Chronic kidney disease, stage 3 unspecified: Secondary | ICD-10-CM | POA: Diagnosis not present

## 2023-11-18 DIAGNOSIS — R809 Proteinuria, unspecified: Secondary | ICD-10-CM | POA: Diagnosis not present

## 2023-11-18 DIAGNOSIS — E1169 Type 2 diabetes mellitus with other specified complication: Secondary | ICD-10-CM | POA: Diagnosis not present

## 2023-11-18 DIAGNOSIS — Z299 Encounter for prophylactic measures, unspecified: Secondary | ICD-10-CM | POA: Diagnosis not present

## 2023-11-18 DIAGNOSIS — R52 Pain, unspecified: Secondary | ICD-10-CM | POA: Diagnosis not present

## 2023-11-18 DIAGNOSIS — E1129 Type 2 diabetes mellitus with other diabetic kidney complication: Secondary | ICD-10-CM | POA: Diagnosis not present

## 2023-11-18 DIAGNOSIS — I10 Essential (primary) hypertension: Secondary | ICD-10-CM | POA: Diagnosis not present

## 2023-11-24 DIAGNOSIS — M5416 Radiculopathy, lumbar region: Secondary | ICD-10-CM | POA: Diagnosis not present

## 2023-11-25 ENCOUNTER — Ambulatory Visit (HOSPITAL_COMMUNITY)
Admission: RE | Admit: 2023-11-25 | Discharge: 2023-11-25 | Disposition: A | Discharge status: Home / Self Care | Payer: Medicare Other | Source: Ambulatory Visit | Attending: Radiation Oncology | Admitting: Radiation Oncology

## 2023-11-25 DIAGNOSIS — J479 Bronchiectasis, uncomplicated: Secondary | ICD-10-CM | POA: Diagnosis not present

## 2023-11-25 DIAGNOSIS — I700 Atherosclerosis of aorta: Secondary | ICD-10-CM | POA: Diagnosis not present

## 2023-11-25 DIAGNOSIS — C3490 Malignant neoplasm of unspecified part of unspecified bronchus or lung: Secondary | ICD-10-CM | POA: Diagnosis not present

## 2023-11-25 DIAGNOSIS — C3411 Malignant neoplasm of upper lobe, right bronchus or lung: Secondary | ICD-10-CM | POA: Insufficient documentation

## 2023-11-30 NOTE — Progress Notes (Incomplete)
Radiation Oncology         [phone] ________________________________  Name: Carol Keith MRN: [id number]  Date: 12/01/2023  DOB: [birth year]  Follow-Up Visit Note  CC: Kirstie Peri, MD  Loreli Slot, *  No diagnosis found.  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 She was last seen in office on 08-22-23 for a routine follow up. Since then, she continued to follow up with care specialist to manage her lower back pain and knee pain. She had endured a fall to her knees but recent x-ray did not indicate any acute findings or injury.   She presented for a screening bilateral mammogram on 09-14-23 which showed no mammographic evidence of malignancy. Most recent CT chest on 11-25-23 showed a slight decrease in size of a right upper lobe mass, currently measuring 1.8 x 3.1 cm, compared to  2.3 x 3.8 cm on 03/25/2023. Scan showed no evidence of metastatic disease.  No other significant oncologic interval history since the patient was last seen.                                 Allergies:  is allergic to ace inhibitors, actos [pioglitazone], lipitor [atorvastatin], and metformin and related.  Meds: Current Outpatient Medications  Medication Sig Dispense Refill   allopurinol (ZYLOPRIM) 100 MG tablet Take 100 mg by mouth daily.      amLODipine (NORVASC) 5 MG tablet Take 5 mg by mouth daily.     ascorbic acid (VITAMIN C) 500 MG tablet Take 500 mg by mouth daily.     aspirin EC 81 MG tablet  Take 81 mg by mouth daily.     bumetanide (BUMEX) 1 MG tablet Take 1 mg by mouth daily.     Camphor-Menthol-Methyl Sal (SALONPAS) 3.10-16-08 % PTCH Place 1 patch onto the skin daily as needed (pain).     cetirizine (ZYRTEC) 10 MG tablet Take 10 mg by mouth daily.     Cholecalciferol (VITAMIN D3) 1.25 MG (50000 UT) CAPS Take 1 capsule by mouth once a week.     Cyanocobalamin (VITAMIN B-12) 2500 MCG SUBL Place 2,500 mcg under the tongue daily.     diclofenac sodium (VOLTAREN) 1 % GEL Apply 2 g topically 2 (two) times daily as needed (pain).     doxazosin (CARDURA XL) 4 MG 24 hr tablet Take 4 mg by mouth at bedtime.     Exenatide ER (BYDUREON BCISE) 2 MG/0.85ML AUIJ Inject 2 mg into the skin once a week.     glimepiride (AMARYL) 4 MG tablet Take 4 mg by mouth daily with breakfast.      glucose blood test strip 1 each by Other route as needed for other. Use as instructed     Insulin Pen Needle (NOVOFINE) 32G X 6 MM MISC by Does not apply route.     loperamide (IMODIUM) 2 MG capsule Take 1 capsule (2 mg total) by mouth 4 (  four) times daily as needed for diarrhea or loose stools. 12 capsule 0   losartan (COZAAR) 100 MG tablet Take 100 mg by mouth daily.     meclizine (ANTIVERT) 25 MG tablet Take 25 mg by mouth 2 (two) times daily as needed for dizziness.     metoprolol succinate (TOPROL-XL) 100 MG 24 hr tablet Take 100 mg by mouth daily with breakfast. Take with or immediately following a meal.      pantoprazole (PROTONIX) 40 MG tablet Take 40 mg by mouth daily before breakfast.     Polyethyl Glycol-Propyl Glycol (LUBRICANT EYE DROPS) 0.4-0.3 % SOLN Place 1 drop into both eyes 3 (three) times daily as needed (dry/irritated eyes.). Systane     polyethylene glycol (MIRALAX / GLYCOLAX) 17 g packet Take 17 g by mouth daily as needed (constipation.).     potassium chloride (K-DUR) 10 MEQ tablet Take 20 mEq by mouth daily.      rosuvastatin (CRESTOR) 10 MG tablet Take 10 mg by mouth at bedtime.       tiZANidine (ZANAFLEX) 4 MG tablet Take 4 mg by mouth every 8 (eight) hours as needed for muscle spasms.      TOUJEO SOLOSTAR 300 UNIT/ML SOPN Inject 45 Units into the skin at bedtime.     No current facility-administered medications for this encounter.    Physical Findings: The patient is in no acute distress. Patient is alert and oriented.  vitals were not taken for this visit. .  No significant changes. Lungs are clear to auscultation bilaterally. Heart has regular rate and rhythm. No palpable cervical, supraclavicular, or axillary adenopathy. Abdomen soft, non-tender, normal bowel sounds.   Lab Findings: Lab Results  Component Value Date   WBC 4.4 05/25/2023   HGB 10.4 (L) 05/25/2023   HCT 32.1 (L) 05/25/2023   MCV 87.9 05/25/2023   PLT 161 05/25/2023    Radiographic Findings: CT CHEST WO CONTRAST Result Date: 11/30/2023 CLINICAL DATA:  Non-small cell lung cancer, non metastatic, assess treatment response. Radiation therapy. * Tracking Code: BO * EXAM: CT CHEST WITHOUT CONTRAST TECHNIQUE: Multidetector CT imaging of the chest was performed following the standard protocol without IV contrast. RADIATION DOSE REDUCTION: This exam was performed according to the departmental dose-optimization program which includes automated exposure control, adjustment of the mA and/or kV according to patient size and/or use of iterative reconstruction technique. COMPARISON:  03/25/2023. FINDINGS: Cardiovascular: Atherosclerotic calcification of the aorta. Heart is at the upper limits of normal in size to mildly enlarged. No pericardial effusion. Mediastinum/Nodes: No pathologically enlarged mediastinal or axillary lymph nodes. Hilar regions are difficult to definitively evaluate without IV contrast. Surgical clips in the right axilla. Esophagus is grossly unremarkable. Lungs/Pleura: Masslike consolidation in the apical segment right upper lobe measures 1.8 x 3.1 cm, decreased slightly from 2.3 x 3.8 cm on  03/25/2023. Surrounding ground-glass, bronchiectasis and architectural distortion, compatible with radiation therapy. Subpleural radiation scarring in the anterior right lung. No suspicious pulmonary nodules. No pleural fluid. Airway is unremarkable. Upper Abdomen: Visualized portions of the liver, adrenal glands, left kidney, spleen, pancreas, stomach and bowel are grossly unremarkable. No upper abdominal adenopathy. Musculoskeletal: Degenerative changes in the spine. IMPRESSION: 1. Slight decrease in size of a right upper lobe mass, with surrounding evidence of radiation therapy. No evidence of metastatic disease. 2.  Aortic atherosclerosis (ICD10-I70.0). Electronically Signed   By: Leanna Battles M.D.   On: 11/30/2023 13:42    Impression:  Non-small cell lung cancer presenting in the right upper  lobe, cT2a  The patient is recovering from the effects of radiation.  ***  Plan:  ***   *** minutes of total time was spent for this patient encounter, including preparation, face-to-face counseling with the patient and coordination of care, physical exam, and documentation of the encounter. ____________________________________  Billie Lade, PhD, MD  This document serves as a record of services personally performed by Antony Blackbird, MD. It was created on his behalf by Herbie Saxon, a trained medical scribe. The creation of this record is based on the scribe's personal observations and the provider's statements to them. This document has been checked and approved by the attending provider.

## 2023-12-01 ENCOUNTER — Ambulatory Visit
Admission: RE | Admit: 2023-12-01 | Discharge: 2023-12-01 | Disposition: A | Discharge status: Home / Self Care | Payer: Medicare Other | Source: Ambulatory Visit | Attending: Radiation Oncology | Admitting: Radiation Oncology

## 2023-12-09 ENCOUNTER — Encounter: Payer: Self-pay | Admitting: Radiation Oncology

## 2023-12-09 DIAGNOSIS — I10 Essential (primary) hypertension: Secondary | ICD-10-CM | POA: Diagnosis not present

## 2023-12-12 ENCOUNTER — Encounter: Payer: Self-pay | Admitting: Radiation Oncology

## 2023-12-12 ENCOUNTER — Ambulatory Visit
Admission: RE | Admit: 2023-12-12 | Discharge: 2023-12-12 | Disposition: A | Discharge status: Home / Self Care | Payer: Medicare Other | Source: Ambulatory Visit | Attending: Radiation Oncology | Admitting: Radiation Oncology

## 2023-12-12 VITALS — BP 185/79 | HR 67 | Temp 97.6°F | Resp 20 | Ht 63.0 in | Wt 187.8 lb

## 2023-12-12 DIAGNOSIS — Z7982 Long term (current) use of aspirin: Secondary | ICD-10-CM | POA: Insufficient documentation

## 2023-12-12 DIAGNOSIS — Z923 Personal history of irradiation: Secondary | ICD-10-CM | POA: Diagnosis not present

## 2023-12-12 DIAGNOSIS — Z7984 Long term (current) use of oral hypoglycemic drugs: Secondary | ICD-10-CM | POA: Diagnosis not present

## 2023-12-12 DIAGNOSIS — I700 Atherosclerosis of aorta: Secondary | ICD-10-CM | POA: Diagnosis not present

## 2023-12-12 DIAGNOSIS — Z79899 Other long term (current) drug therapy: Secondary | ICD-10-CM | POA: Insufficient documentation

## 2023-12-12 DIAGNOSIS — Z7985 Long-term (current) use of injectable non-insulin antidiabetic drugs: Secondary | ICD-10-CM | POA: Insufficient documentation

## 2023-12-12 DIAGNOSIS — C3411 Malignant neoplasm of upper lobe, right bronchus or lung: Secondary | ICD-10-CM | POA: Diagnosis not present

## 2023-12-12 NOTE — Progress Notes (Signed)
 Carol Keith is here today for follow up post radiation to the lung.  Lung Side: Right,patient completed treatment on 07/22/23  Does the patient complain of any of the following: Pain:Denies Shortness of breath w/wo exertion: Denies Cough: She reports intermittently. Hemoptysis: Denies Pain with swallowing: Denies Swallowing/choking concerns: Denies Appetite: Good Energy Level: She reports low energy. Post radiation skin Changes: Denies     BP (!) 185/79 (BP Location: Left Arm, Patient Position: Sitting, Cuff Size: Large)   Pulse 67   Temp 97.6 F (36.4 C)   Resp 20   Ht 5\' 3"  (1.6 m)   Wt 187 lb 12.8 oz (85.2 kg)   SpO2 100%   BMI 33.27 kg/m

## 2023-12-12 NOTE — Progress Notes (Signed)
 Radiation Oncology         [phone] ________________________________  Name: Carol Keith MRN: [id number]  Date: 12/12/2023  DOB: Oct 05, 1951  Follow-Up Visit Note  CC: Kirstie Peri, MD  Loreli Slot, *    ICD-10-CM   1. 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 She was last seen in office on 08/22/23 for a routine follow up.   Her most recent chest CT on 11/25/23 shows a slight decrease in size of a right upper lobe mass, measuring 1.8 x 3.1 cm, previously measuring 2.3 x 3.8 cm on 03/25/2023, along with surrounding post radiation changes also noted. CT findings otherwise show no evidence of metastatic disease.   Other pertinent imaging performed in the interval includes a bilateral screening mammogram on 09/14/23 which showed no evidence of malignancy in either breast.   No other significant oncologic interval history since the patient was last seen.   Patient notes worsening shortness of breath with exertion today. She states he cough has improved since completing the radiation treatment and otherwise denies any chest pressure or hemoptysis.                               Allergies:  is allergic to ace inhibitors, actos [pioglitazone], lipitor [atorvastatin], and metformin and related.  Meds: Current Outpatient Medications  Medication Sig Dispense Refill   allopurinol  (ZYLOPRIM) 100 MG tablet Take 100 mg by mouth daily.      amLODipine (NORVASC) 5 MG tablet Take 5 mg by mouth daily.     ascorbic acid (VITAMIN C) 500 MG tablet Take 500 mg by mouth daily.     aspirin EC 81 MG tablet Take 81 mg by mouth daily.     bumetanide (BUMEX) 1 MG tablet Take 1 mg by mouth daily.     Camphor-Menthol-Methyl Sal (SALONPAS) 3.10-16-08 % PTCH Place 1 patch onto the skin daily as needed (pain).     cetirizine (ZYRTEC) 10 MG tablet Take 10 mg by mouth daily.     Cholecalciferol (VITAMIN D3) 1.25 MG (50000 UT) CAPS Take 1 capsule by mouth once a week.     Cyanocobalamin (VITAMIN B-12) 2500 MCG SUBL Place 2,500 mcg under the tongue daily.     diclofenac sodium (VOLTAREN) 1 % GEL Apply 2 g topically 2 (two) times daily as needed (pain).     doxazosin (CARDURA XL) 4 MG 24 hr tablet Take 4 mg by mouth at bedtime.     Exenatide ER (BYDUREON BCISE) 2 MG/0.85ML AUIJ Inject 2 mg into the skin once a week.     glimepiride (AMARYL) 4 MG tablet Take 4 mg by mouth daily with breakfast.      glucose blood test strip 1 each by Other route as needed for other. Use as instructed     Insulin Pen  Needle (NOVOFINE) 32G X 6 MM MISC by Does not apply route.     loperamide (IMODIUM) 2 MG capsule Take 1 capsule (2 mg total) by mouth 4 (four) times daily as needed for diarrhea or loose stools. 12 capsule 0   losartan (COZAAR) 100 MG tablet Take 100 mg by mouth daily.     meclizine (ANTIVERT) 25 MG tablet Take 25 mg by mouth 2 (two) times daily as needed for dizziness.     metoprolol succinate (TOPROL-XL) 100 MG 24 hr tablet Take 100 mg by mouth daily with breakfast. Take with or immediately following a meal.      pantoprazole (PROTONIX) 40 MG tablet Take 40 mg by mouth daily before breakfast.     Polyethyl Glycol-Propyl Glycol (LUBRICANT EYE DROPS) 0.4-0.3 % SOLN Place 1 drop into both eyes 3 (three) times daily as needed (dry/irritated eyes.). Systane     polyethylene glycol (MIRALAX / GLYCOLAX) 17 g  packet Take 17 g by mouth daily as needed (constipation.).     rosuvastatin (CRESTOR) 10 MG tablet Take 10 mg by mouth at bedtime.      tiZANidine (ZANAFLEX) 4 MG tablet Take 4 mg by mouth every 8 (eight) hours as needed for muscle spasms.      TOUJEO SOLOSTAR 300 UNIT/ML SOPN Inject 45 Units into the skin at bedtime.     potassium chloride (K-DUR) 10 MEQ tablet Take 20 mEq by mouth daily.      No current facility-administered medications for this encounter.    Physical Findings: The patient is in no acute distress. Patient is alert and oriented.  height is 5\' 3"  (1.6 m) and weight is 187 lb 12.8 oz (85.2 kg). Her temperature is 97.6 F (36.4 C). Her blood pressure is 185/79 (abnormal) and her pulse is 67. Her respiration is 20 and oxygen saturation is 100%. .  No significant changes. Lungs are clear to auscultation bilaterally. Heart has regular rate and rhythm. No palpable cervical, supraclavicular, or axillary adenopathy.    Lab Findings: Lab Results  Component Value Date   WBC 4.4 05/25/2023   HGB 10.4 (L) 05/25/2023   HCT 32.1 (L) 05/25/2023   MCV 87.9 05/25/2023   PLT 161 05/25/2023    Radiographic Findings: CT CHEST WO CONTRAST Result Date: 11/30/2023 CLINICAL DATA:  Non-small cell lung cancer, non metastatic, assess treatment response. Radiation therapy. * Tracking Code: BO * EXAM: CT CHEST WITHOUT CONTRAST TECHNIQUE: Multidetector CT imaging of the chest was performed following the standard protocol without IV contrast. RADIATION DOSE REDUCTION: This exam was performed according to the departmental dose-optimization program which includes automated exposure control, adjustment of the mA and/or kV according to patient size and/or use of iterative reconstruction technique. COMPARISON:  03/25/2023. FINDINGS: Cardiovascular: Atherosclerotic calcification of the aorta. Heart is at the upper limits of normal in size to mildly enlarged. No pericardial effusion. Mediastinum/Nodes: No  pathologically enlarged mediastinal or axillary lymph nodes. Hilar regions are difficult to definitively evaluate without IV contrast. Surgical clips in the right axilla. Esophagus is grossly unremarkable. Lungs/Pleura: Masslike consolidation in the apical segment right upper lobe measures 1.8 x 3.1 cm, decreased slightly from 2.3 x 3.8 cm on 03/25/2023. Surrounding ground-glass, bronchiectasis and architectural distortion, compatible with radiation therapy. Subpleural radiation scarring in the anterior right lung. No suspicious pulmonary nodules. No pleural fluid. Airway is unremarkable. Upper Abdomen: Visualized portions of the liver, adrenal glands, left kidney, spleen, pancreas, stomach and bowel are grossly unremarkable. No upper abdominal adenopathy. Musculoskeletal: Degenerative  changes in the spine. IMPRESSION: 1. Slight decrease in size of a right upper lobe mass, with surrounding evidence of radiation therapy. No evidence of metastatic disease. 2.  Aortic atherosclerosis (ICD10-I70.0). Electronically Signed   By: Leanna Battles M.D.   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 We reviewed the results of her most recent imaging. CT on 11/25/2023 demonstrated a slight decrease in size of the treated right upper lobe mass with no evidence of metastatic disease.   Patient is experiencing worsening shortness of breath. She is not currently on any medications for this. Recommended follow-up with pulmonology to help address this issue.  Plan: Dr. Roselind Messier recommends follow-up chest CT in 6 months. Routine office visit 1-2 days after scan to review results. Patient was encouraged to call with any questions or concerns in the meantime.    20 minutes of total time was spent for this patient encounter, including preparation, face-to-face counseling with the patient and coordination of care,  physical exam, and documentation of the encounter. ____________________________________   Bryan Lemma, PA-C  This document serves as a record of services personally performed by Bryan Lemma, PA-C. It was created on his behalf by Neena Rhymes, a trained medical scribe. The creation of this record is based on the scribe's personal observations and the provider's statements to them. This document has been checked and approved by the attending provider.

## 2023-12-15 DIAGNOSIS — I10 Essential (primary) hypertension: Secondary | ICD-10-CM | POA: Diagnosis not present

## 2023-12-15 DIAGNOSIS — R52 Pain, unspecified: Secondary | ICD-10-CM | POA: Diagnosis not present

## 2023-12-15 DIAGNOSIS — E1169 Type 2 diabetes mellitus with other specified complication: Secondary | ICD-10-CM | POA: Diagnosis not present

## 2023-12-15 DIAGNOSIS — Z299 Encounter for prophylactic measures, unspecified: Secondary | ICD-10-CM | POA: Diagnosis not present

## 2023-12-15 DIAGNOSIS — C3491 Malignant neoplasm of unspecified part of right bronchus or lung: Secondary | ICD-10-CM | POA: Diagnosis not present

## 2023-12-27 DIAGNOSIS — M79675 Pain in left toe(s): Secondary | ICD-10-CM | POA: Diagnosis not present

## 2023-12-27 DIAGNOSIS — M79674 Pain in right toe(s): Secondary | ICD-10-CM | POA: Diagnosis not present

## 2023-12-27 DIAGNOSIS — E1140 Type 2 diabetes mellitus with diabetic neuropathy, unspecified: Secondary | ICD-10-CM | POA: Diagnosis not present

## 2023-12-27 DIAGNOSIS — I739 Peripheral vascular disease, unspecified: Secondary | ICD-10-CM | POA: Diagnosis not present

## 2023-12-27 DIAGNOSIS — M79671 Pain in right foot: Secondary | ICD-10-CM | POA: Diagnosis not present

## 2023-12-27 DIAGNOSIS — M79672 Pain in left foot: Secondary | ICD-10-CM | POA: Diagnosis not present

## 2023-12-27 DIAGNOSIS — L110 Acquired keratosis follicularis: Secondary | ICD-10-CM | POA: Diagnosis not present

## 2024-01-02 DIAGNOSIS — E1122 Type 2 diabetes mellitus with diabetic chronic kidney disease: Secondary | ICD-10-CM | POA: Diagnosis not present

## 2024-01-02 DIAGNOSIS — I10 Essential (primary) hypertension: Secondary | ICD-10-CM | POA: Diagnosis not present

## 2024-01-02 DIAGNOSIS — Z299 Encounter for prophylactic measures, unspecified: Secondary | ICD-10-CM | POA: Diagnosis not present

## 2024-01-08 DIAGNOSIS — I10 Essential (primary) hypertension: Secondary | ICD-10-CM | POA: Diagnosis not present

## 2024-01-10 DIAGNOSIS — H35033 Hypertensive retinopathy, bilateral: Secondary | ICD-10-CM | POA: Diagnosis not present

## 2024-02-08 ENCOUNTER — Telehealth: Payer: Self-pay | Admitting: *Deleted

## 2024-02-08 DIAGNOSIS — I10 Essential (primary) hypertension: Secondary | ICD-10-CM | POA: Diagnosis not present

## 2024-02-08 NOTE — Telephone Encounter (Signed)
 CALLED PATIENT TO INFORM OF CT FOR 06-13-24- ARRIVAL TIME- 12:45 PM @ [facility], NO RESTRICTIONS TO SCAN, PATIENT TO RECEIVE RESULTS FROM DR, Carol Keith ON 06-21-24 @ 11:15 AM, LVM FOR A RETURN CALL

## 2024-02-09 ENCOUNTER — Telehealth: Payer: Self-pay | Admitting: *Deleted

## 2024-02-09 NOTE — Telephone Encounter (Signed)
 RETURNED PATIENT'S PHONE CALL, LVM FOR A RETURN CALL

## 2024-03-08 DIAGNOSIS — M79671 Pain in right foot: Secondary | ICD-10-CM | POA: Diagnosis not present

## 2024-03-08 DIAGNOSIS — L110 Acquired keratosis follicularis: Secondary | ICD-10-CM | POA: Diagnosis not present

## 2024-03-08 DIAGNOSIS — M79675 Pain in left toe(s): Secondary | ICD-10-CM | POA: Diagnosis not present

## 2024-03-08 DIAGNOSIS — E1140 Type 2 diabetes mellitus with diabetic neuropathy, unspecified: Secondary | ICD-10-CM | POA: Diagnosis not present

## 2024-03-08 DIAGNOSIS — I739 Peripheral vascular disease, unspecified: Secondary | ICD-10-CM | POA: Diagnosis not present

## 2024-03-08 DIAGNOSIS — M79672 Pain in left foot: Secondary | ICD-10-CM | POA: Diagnosis not present

## 2024-03-08 DIAGNOSIS — M79674 Pain in right toe(s): Secondary | ICD-10-CM | POA: Diagnosis not present

## 2024-03-10 DIAGNOSIS — I10 Essential (primary) hypertension: Secondary | ICD-10-CM | POA: Diagnosis not present

## 2024-03-21 DIAGNOSIS — Z299 Encounter for prophylactic measures, unspecified: Secondary | ICD-10-CM | POA: Diagnosis not present

## 2024-03-21 DIAGNOSIS — I10 Essential (primary) hypertension: Secondary | ICD-10-CM | POA: Diagnosis not present

## 2024-03-21 DIAGNOSIS — E1122 Type 2 diabetes mellitus with diabetic chronic kidney disease: Secondary | ICD-10-CM | POA: Diagnosis not present

## 2024-03-21 DIAGNOSIS — N183 Chronic kidney disease, stage 3 unspecified: Secondary | ICD-10-CM | POA: Diagnosis not present

## 2024-03-21 DIAGNOSIS — Z794 Long term (current) use of insulin: Secondary | ICD-10-CM | POA: Diagnosis not present

## 2024-03-21 DIAGNOSIS — K5900 Constipation, unspecified: Secondary | ICD-10-CM | POA: Diagnosis not present

## 2024-04-09 DIAGNOSIS — I10 Essential (primary) hypertension: Secondary | ICD-10-CM | POA: Diagnosis not present

## 2024-04-10 DIAGNOSIS — M180 Bilateral primary osteoarthritis of first carpometacarpal joints: Secondary | ICD-10-CM | POA: Diagnosis not present

## 2024-05-10 DIAGNOSIS — I10 Essential (primary) hypertension: Secondary | ICD-10-CM | POA: Diagnosis not present

## 2024-05-11 DIAGNOSIS — N1832 Chronic kidney disease, stage 3b: Secondary | ICD-10-CM | POA: Diagnosis not present

## 2024-05-11 DIAGNOSIS — E1169 Type 2 diabetes mellitus with other specified complication: Secondary | ICD-10-CM | POA: Diagnosis not present

## 2024-05-11 DIAGNOSIS — I10 Essential (primary) hypertension: Secondary | ICD-10-CM | POA: Diagnosis not present

## 2024-05-11 DIAGNOSIS — M79673 Pain in unspecified foot: Secondary | ICD-10-CM | POA: Diagnosis not present

## 2024-05-11 DIAGNOSIS — Z299 Encounter for prophylactic measures, unspecified: Secondary | ICD-10-CM | POA: Diagnosis not present

## 2024-05-11 DIAGNOSIS — M25512 Pain in left shoulder: Secondary | ICD-10-CM | POA: Diagnosis not present

## 2024-05-11 DIAGNOSIS — R52 Pain, unspecified: Secondary | ICD-10-CM | POA: Diagnosis not present

## 2024-05-14 DIAGNOSIS — M79672 Pain in left foot: Secondary | ICD-10-CM | POA: Diagnosis not present

## 2024-05-14 DIAGNOSIS — M19012 Primary osteoarthritis, left shoulder: Secondary | ICD-10-CM | POA: Diagnosis not present

## 2024-05-14 DIAGNOSIS — S99922A Unspecified injury of left foot, initial encounter: Secondary | ICD-10-CM | POA: Diagnosis not present

## 2024-05-14 DIAGNOSIS — M25512 Pain in left shoulder: Secondary | ICD-10-CM | POA: Diagnosis not present

## 2024-05-14 DIAGNOSIS — M7732 Calcaneal spur, left foot: Secondary | ICD-10-CM | POA: Diagnosis not present

## 2024-05-29 DIAGNOSIS — M79675 Pain in left toe(s): Secondary | ICD-10-CM | POA: Diagnosis not present

## 2024-05-29 DIAGNOSIS — E1140 Type 2 diabetes mellitus with diabetic neuropathy, unspecified: Secondary | ICD-10-CM | POA: Diagnosis not present

## 2024-05-29 DIAGNOSIS — L110 Acquired keratosis follicularis: Secondary | ICD-10-CM | POA: Diagnosis not present

## 2024-05-29 DIAGNOSIS — M79672 Pain in left foot: Secondary | ICD-10-CM | POA: Diagnosis not present

## 2024-05-29 DIAGNOSIS — I739 Peripheral vascular disease, unspecified: Secondary | ICD-10-CM | POA: Diagnosis not present

## 2024-05-29 DIAGNOSIS — M79674 Pain in right toe(s): Secondary | ICD-10-CM | POA: Diagnosis not present

## 2024-05-29 DIAGNOSIS — M79671 Pain in right foot: Secondary | ICD-10-CM | POA: Diagnosis not present

## 2024-06-05 DIAGNOSIS — M25512 Pain in left shoulder: Secondary | ICD-10-CM | POA: Diagnosis not present

## 2024-06-09 DIAGNOSIS — I10 Essential (primary) hypertension: Secondary | ICD-10-CM | POA: Diagnosis not present

## 2024-06-13 ENCOUNTER — Ambulatory Visit (HOSPITAL_COMMUNITY)
Admission: RE | Admit: 2024-06-13 | Discharge: 2024-06-13 | Disposition: A | Discharge status: Home / Self Care | Source: Ambulatory Visit | Attending: Radiology | Admitting: Radiology

## 2024-06-13 ENCOUNTER — Other Ambulatory Visit (HOSPITAL_COMMUNITY)

## 2024-06-13 DIAGNOSIS — C3411 Malignant neoplasm of upper lobe, right bronchus or lung: Secondary | ICD-10-CM | POA: Diagnosis not present

## 2024-06-13 DIAGNOSIS — C3490 Malignant neoplasm of unspecified part of unspecified bronchus or lung: Secondary | ICD-10-CM | POA: Diagnosis not present

## 2024-06-13 DIAGNOSIS — I700 Atherosclerosis of aorta: Secondary | ICD-10-CM | POA: Diagnosis not present

## 2024-06-20 DIAGNOSIS — M25512 Pain in left shoulder: Secondary | ICD-10-CM | POA: Diagnosis not present

## 2024-06-21 ENCOUNTER — Ambulatory Visit: Payer: Self-pay | Admitting: Radiation Oncology

## 2024-06-26 DIAGNOSIS — Z23 Encounter for immunization: Secondary | ICD-10-CM | POA: Diagnosis not present

## 2024-06-26 DIAGNOSIS — E1122 Type 2 diabetes mellitus with diabetic chronic kidney disease: Secondary | ICD-10-CM | POA: Diagnosis not present

## 2024-06-26 DIAGNOSIS — Z299 Encounter for prophylactic measures, unspecified: Secondary | ICD-10-CM | POA: Diagnosis not present

## 2024-06-26 DIAGNOSIS — C3491 Malignant neoplasm of unspecified part of right bronchus or lung: Secondary | ICD-10-CM | POA: Diagnosis not present

## 2024-06-26 DIAGNOSIS — E119 Type 2 diabetes mellitus without complications: Secondary | ICD-10-CM | POA: Diagnosis not present

## 2024-06-26 DIAGNOSIS — N183 Chronic kidney disease, stage 3 unspecified: Secondary | ICD-10-CM | POA: Diagnosis not present

## 2024-06-26 DIAGNOSIS — I10 Essential (primary) hypertension: Secondary | ICD-10-CM | POA: Diagnosis not present

## 2024-06-27 NOTE — Progress Notes (Signed)
 Radiation Oncology         (336) [phone] ________________________________  Name: Carol Keith MRN: [id number]  Date: 06/28/2024  DOB: 05-06-1951  Follow-Up Visit Note  CC: [name], MD  [name], *  No diagnosis found.  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 She was last seen in office on 12/12/23 for a routine follow up. Patient continued to follow up with their specialists to manage their chronic conditions.   Most recent CT chest performed on 06/13/24 showed increased size of the masslike consolidation in the paramedian right upper lobe with associated architectural distortion measuring 3.5 x 2.8 cm previously 3.1 x 1.8 cm however no convincing evidence of metastatic disease or lymph node involvement in the chest.  No other significant oncologic interval history since the patient was last seen.   Allergies:  is allergic to ace inhibitors, actos [pioglitazone], lipitor [atorvastatin], and metformin and related.  Meds: Current Outpatient Medications  Medication Sig Dispense Refill   allopurinol  (ZYLOPRIM ) 100 MG tablet Take 100 mg by mouth daily.      amLODipine  (NORVASC ) 5 MG tablet Take 5 mg by mouth daily.     ascorbic acid (VITAMIN C) 500 MG tablet Take 500 mg by mouth daily.     aspirin  EC 81 MG tablet Take 81 mg by mouth daily.     bumetanide (BUMEX) 1 MG tablet Take 1 mg by mouth daily.     Camphor-Menthol -Methyl Sal (SALONPAS) 3.10-16-08 % PTCH  Place 1 patch onto the skin daily as needed (pain).     cetirizine (ZYRTEC) 10 MG tablet Take 10 mg by mouth daily.     Cholecalciferol  (VITAMIN D3) 1.25 MG (50000 UT) CAPS Take 1 capsule by mouth once a week.     Cyanocobalamin (VITAMIN B-12) 2500 MCG SUBL Place 2,500 mcg under the tongue daily.     diclofenac sodium (VOLTAREN) 1 % GEL Apply 2 g topically 2 (two) times daily as needed (pain).     doxazosin (CARDURA XL) 4 MG 24 hr tablet Take 4 mg by mouth at bedtime.     Exenatide  ER (BYDUREON  BCISE) 2 MG/0.85ML AUIJ Inject 2 mg into the skin once a week.     glimepiride  (AMARYL ) 4 MG tablet Take 4 mg by mouth daily with breakfast.      glucose blood test strip 1 each by Other route as needed for other. Use as instructed     Insulin  Pen Needle (NOVOFINE) 32G X 6 MM MISC by Does not apply route.     loperamide  (IMODIUM ) 2 MG capsule Take 1 capsule (2 mg total) by mouth 4 (four) times daily as needed for diarrhea or loose stools. 12 capsule 0   losartan  (COZAAR ) 100 MG tablet Take 100 mg by mouth daily.     meclizine (ANTIVERT) 25 MG tablet Take 25 mg by mouth 2 (two) times daily as needed for dizziness.     metoprolol  succinate (TOPROL -XL) 100 MG 24 hr tablet Take 100 mg by  mouth daily with breakfast. Take with or immediately following a meal.      pantoprazole (PROTONIX) 40 MG tablet Take 40 mg by mouth daily before breakfast.     Polyethyl Glycol-Propyl Glycol (LUBRICANT EYE DROPS) 0.4-0.3 % SOLN Place 1 drop into both eyes 3 (three) times daily as needed (dry/irritated eyes.). Systane     polyethylene glycol (MIRALAX  / GLYCOLAX ) 17 g packet Take 17 g by mouth daily as needed (constipation.).     potassium chloride  (K-DUR) 10 MEQ tablet Take 20 mEq by mouth daily.      rosuvastatin (CRESTOR) 10 MG tablet Take 10 mg by mouth at bedtime.      tiZANidine (ZANAFLEX) 4 MG tablet Take 4 mg by mouth every 8 (eight) hours as needed for muscle spasms.      TOUJEO  SOLOSTAR 300 UNIT/ML SOPN Inject 45  Units into the skin at bedtime.     No current facility-administered medications for this visit.    Physical Findings: The patient is in no acute distress. Patient is alert and oriented.  vitals were not taken for this visit. .  No significant changes. Lungs are clear to auscultation bilaterally. Heart has regular rate and rhythm. No palpable cervical, supraclavicular, or axillary adenopathy. Abdomen soft, non-tender, normal bowel sounds.   Lab Findings: Lab Results  Component Value Date   WBC 4.4 05/25/2023   HGB 10.4 (L) 05/25/2023   HCT 32.1 (L) 05/25/2023   MCV 87.9 05/25/2023   PLT 161 05/25/2023    Radiographic Findings: CT CHEST WO CONTRAST Result Date: 06/22/2024 CLINICAL DATA:  Non-small-cell lung cancer, monitor. * Tracking Code: BO * EXAM: CT CHEST WITHOUT CONTRAST TECHNIQUE: Multidetector CT imaging of the chest was performed following the standard protocol without IV contrast. RADIATION DOSE REDUCTION: This exam was performed according to the departmental dose-optimization program which includes automated exposure control, adjustment of the mA and/or kV according to patient size and/or use of iterative reconstruction technique. COMPARISON:  Multiple priors including CT November 25, 2023 FINDINGS: Cardiovascular: Aortic atherosclerosis. Normal size heart. Coronary artery calcifications. No significant pericardial effusion/thickening. Mediastinum/Nodes: No pathologically enlarged mediastinal, hilar or axillary lymph nodes noting limited evaluation of the hilar structures on noncontrast enhanced examination. No suspicious thyroid  nodule. Lungs/Pleura: Increased size of the masslike consolidation in the paramedian right upper lobe with associated architectural distortion measuring 3.5 x 2.8 cm on image 32/3 previously 3.1 x 1.8 cm. There is decreased associated ground-glass and linear opacities. Subpleural reticulations in the lateral right anterior upper lobe may reflect posttreatment  change. No new suspicious pulmonary nodules or masses. Upper Abdomen: Focus of pneumobilia in the left lobe of the liver on image 116/2. Musculoskeletal: No aggressive lytic or blastic lesion of bone. Thoracic spondylosis. Severe degenerative change of the left shoulder. Right axillary and subpectoral surgical clips. Similar nonspecific ill-defined nodularity in the medial right chest superficial to the pectoral muscle measuring 7 mm on image 52/2. IMPRESSION: 1. Increased size of the masslike consolidation in the paramedian right upper lobe with associated architectural distortion, nonspecific possibly reflecting evolving postradiation change but warranting close attention on short-term interval follow-up imaging. 2. No convincing evidence of metastatic disease or lymph node involvement in the chest. 3. Focus of pneumobilia in the left lobe of the liver on image 116/2, suggest correlation for recent history of sphincterotomy and suggest clinical correlation. 4. Similar nonspecific ill-defined nodularity in the medial right chest superficial to the pectoral muscle measuring 7 mm. 5. Aortic atherosclerosis. Aortic Atherosclerosis (ICD10-I70.0). Electronically Signed  By: Reyes Holder M.D.   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  ***  Plan:  ***   *** minutes of total time was spent for this patient encounter, including preparation, face-to-face counseling with the patient and coordination of care, physical exam, and documentation of the encounter. ____________________________________  Lynwood CHARM Nasuti, PhD, MD  This document serves as a record of services personally performed by Lynwood Nasuti, MD. It was created on his behalf by Reymundo Cartwright, a trained medical scribe. The creation of this record is based on the scribe's personal observations and the provider's statements to them.  This document has been checked and approved by the attending provider.

## 2024-06-28 ENCOUNTER — Encounter: Payer: Self-pay | Admitting: Radiation Oncology

## 2024-06-28 ENCOUNTER — Ambulatory Visit
Admission: RE | Admit: 2024-06-28 | Discharge: 2024-06-28 | Disposition: A | Discharge status: Home / Self Care | Payer: Self-pay | Source: Ambulatory Visit | Attending: Radiation Oncology | Admitting: Radiation Oncology

## 2024-06-28 VITALS — BP 186/98 | Temp 96.9°F | Resp 18 | Ht 63.0 in | Wt 189.5 lb

## 2024-06-28 DIAGNOSIS — C3411 Malignant neoplasm of upper lobe, right bronchus or lung: Secondary | ICD-10-CM

## 2024-06-28 DIAGNOSIS — Z7984 Long term (current) use of oral hypoglycemic drugs: Secondary | ICD-10-CM | POA: Diagnosis not present

## 2024-06-28 DIAGNOSIS — Z79899 Other long term (current) drug therapy: Secondary | ICD-10-CM | POA: Diagnosis not present

## 2024-06-28 DIAGNOSIS — Z7985 Long-term (current) use of injectable non-insulin antidiabetic drugs: Secondary | ICD-10-CM | POA: Diagnosis not present

## 2024-06-28 DIAGNOSIS — I2510 Atherosclerotic heart disease of native coronary artery without angina pectoris: Secondary | ICD-10-CM | POA: Insufficient documentation

## 2024-06-28 DIAGNOSIS — M47814 Spondylosis without myelopathy or radiculopathy, thoracic region: Secondary | ICD-10-CM | POA: Insufficient documentation

## 2024-06-28 DIAGNOSIS — Z85118 Personal history of other malignant neoplasm of bronchus and lung: Secondary | ICD-10-CM | POA: Insufficient documentation

## 2024-06-28 DIAGNOSIS — Z794 Long term (current) use of insulin: Secondary | ICD-10-CM | POA: Diagnosis not present

## 2024-06-28 DIAGNOSIS — Z7982 Long term (current) use of aspirin: Secondary | ICD-10-CM | POA: Insufficient documentation

## 2024-06-28 DIAGNOSIS — I700 Atherosclerosis of aorta: Secondary | ICD-10-CM | POA: Insufficient documentation

## 2024-06-28 DIAGNOSIS — M19012 Primary osteoarthritis, left shoulder: Secondary | ICD-10-CM | POA: Insufficient documentation

## 2024-06-28 NOTE — Progress Notes (Signed)
 Carol Keith is here today for follow up post radiation to the lung.  Lung Side: Right, patient completed treatment on 07/22/23  Does the patient complain of any of the following: Pain: No  Shortness of breath w/wo exertion: Yes, mostly on exertion.  Cough: Yes, dry cough Hemoptysis: No Pain with swallowing: No Swallowing/choking concerns: No Appetite: good Energy Level: improving.  Post radiation skin Changes: No    Additional comments if applicable:   Temp (!) 96.9 F (36.1 C) (Temporal)   Resp 18   Ht 5' 3 (1.6 m)   Wt 189 lb 8 oz (86 kg)   SpO2 99%   BMI 33.57 kg/m

## 2024-07-02 DIAGNOSIS — E2839 Other primary ovarian failure: Secondary | ICD-10-CM | POA: Diagnosis not present

## 2024-07-06 ENCOUNTER — Other Ambulatory Visit (HOSPITAL_COMMUNITY)

## 2024-07-10 DIAGNOSIS — I10 Essential (primary) hypertension: Secondary | ICD-10-CM | POA: Diagnosis not present

## 2024-07-12 ENCOUNTER — Ambulatory Visit: Payer: Self-pay | Admitting: Radiation Oncology

## 2024-07-13 ENCOUNTER — Telehealth: Payer: Self-pay | Admitting: Radiation Oncology

## 2024-07-13 ENCOUNTER — Encounter (HOSPITAL_COMMUNITY)
Admission: RE | Admit: 2024-07-13 | Discharge: 2024-07-13 | Disposition: A | Discharge status: Home / Self Care | Source: Ambulatory Visit | Attending: Radiation Oncology | Admitting: Radiation Oncology

## 2024-07-13 DIAGNOSIS — C3411 Malignant neoplasm of upper lobe, right bronchus or lung: Secondary | ICD-10-CM | POA: Diagnosis not present

## 2024-07-13 LAB — GLUCOSE, CAPILLARY: Glucose-Capillary: 66 mg/dL — ABNORMAL LOW (ref 70–99)

## 2024-07-13 MED ORDER — FLUDEOXYGLUCOSE F - 18 (FDG) INJECTION
9.3900 | Freq: Once | INTRAVENOUS | Status: AC
Start: 2024-07-13 — End: 2024-07-13
  Administered 2024-07-13: 9.39 via INTRAVENOUS

## 2024-07-13 NOTE — Telephone Encounter (Signed)
 Received message that pt needs r/s due to funeral. Appt moved to 10/9@1pm .

## 2024-07-16 ENCOUNTER — Ambulatory Visit: Admitting: Radiation Oncology

## 2024-07-18 NOTE — Progress Notes (Signed)
 Radiation Oncology         (336) [phone] ________________________________  Name: Carol Keith MRN: [id number]  Date: 07/19/2024  DOB: 28-Jun-1951  Follow-Up Visit Note  CC: [name], MD  [name], *  No diagnosis found.  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 She was last seen in office on 06/28/24 for a routine follow up. Patient continued to follow up with their specialists to manage their chronic conditions.    Most recent restaging PET scan performed on 07/13/24 showed residual metabolic activity/posttreatment changes at the site of known right apical malignancy. Residual viable disease can not be excluded with recommendation to continue attention on follow-up cecum. Scan also noted Ill-defined ground-glass attenuation in superior segment of right lower lobe without significant FDG uptake, stable to prior chest CT and new to prior PET-CT. This finding may correlate to postradiation/posttreatment changes, versus a second adenocarcinoma spectrum primary or metastasis. Furthermore, right  lumpectomy without hypermetabolic finding to suggest locally recurrent malignancy or axillary nodal disease.   No other significant oncologic interval history since the patient was last seen.                        Allergies:  is allergic to ace inhibitors, actos [pioglitazone], lipitor [atorvastatin], and metformin and related.  Meds: Current Outpatient Medications   Medication Sig Dispense Refill   allopurinol  (ZYLOPRIM ) 100 MG tablet Take 100 mg by mouth daily.      amLODipine  (NORVASC ) 5 MG tablet Take 5 mg by mouth daily.     ascorbic acid (VITAMIN C) 500 MG tablet Take 500 mg by mouth daily.     aspirin  EC 81 MG tablet Take 81 mg by mouth daily.     bumetanide (BUMEX) 1 MG tablet Take 1 mg by mouth daily.     Camphor-Menthol -Methyl Sal (SALONPAS) 3.10-16-08 % PTCH Place 1 patch onto the skin daily as needed (pain).     cetirizine (ZYRTEC) 10 MG tablet Take 10 mg by mouth daily.     Cholecalciferol  (VITAMIN D3) 1.25 MG (50000 UT) CAPS Take 1 capsule by mouth once a week.     Cyanocobalamin (VITAMIN B-12) 2500 MCG SUBL Place 2,500 mcg under the tongue daily.     diclofenac sodium (VOLTAREN) 1 % GEL Apply 2 g topically 2 (two) times daily as needed (pain).     doxazosin (CARDURA XL) 4 MG 24 hr tablet Take 4 mg by mouth at bedtime.     Exenatide  ER (BYDUREON  BCISE) 2 MG/0.85ML AUIJ Inject 2 mg into the skin once a week.     glimepiride  (AMARYL ) 4 MG tablet Take 4 mg by mouth daily with breakfast.      glucose blood test strip 1 each by Other route as needed for other. Use as instructed     Insulin  Pen Needle (NOVOFINE) 32G X 6 MM MISC by Does not apply route.     loperamide  (IMODIUM ) 2 MG capsule Take 1 capsule (2  mg total) by mouth 4 (four) times daily as needed for diarrhea or loose stools. 12 capsule 0   losartan  (COZAAR ) 100 MG tablet Take 100 mg by mouth daily.     meclizine (ANTIVERT) 25 MG tablet Take 25 mg by mouth 2 (two) times daily as needed for dizziness.     metoprolol  succinate (TOPROL -XL) 100 MG 24 hr tablet Take 100 mg by mouth daily with breakfast. Take with or immediately following a meal.      pantoprazole (PROTONIX) 40 MG tablet Take 40 mg by mouth daily before breakfast.     Polyethyl Glycol-Propyl Glycol (LUBRICANT EYE DROPS) 0.4-0.3 % SOLN Place 1 drop into both eyes 3 (three) times daily as needed (dry/irritated eyes.). Systane      polyethylene glycol (MIRALAX  / GLYCOLAX ) 17 g packet Take 17 g by mouth daily as needed (constipation.).     potassium chloride  (K-DUR) 10 MEQ tablet Take 20 mEq by mouth daily.      rosuvastatin (CRESTOR) 10 MG tablet Take 10 mg by mouth at bedtime.      tiZANidine (ZANAFLEX) 4 MG tablet Take 4 mg by mouth every 8 (eight) hours as needed for muscle spasms.      TOUJEO  SOLOSTAR 300 UNIT/ML SOPN Inject 45 Units into the skin at bedtime.     No current facility-administered medications for this visit.    Physical Findings: The patient is in no acute distress. Patient is alert and oriented.  vitals were not taken for this visit. .  No significant changes. Lungs are clear to auscultation bilaterally. Heart has regular rate and rhythm. No palpable cervical, supraclavicular, or axillary adenopathy. Abdomen soft, non-tender, normal bowel sounds.   Lab Findings: Lab Results  Component Value Date   WBC 4.4 05/25/2023   HGB 10.4 (L) 05/25/2023   HCT 32.1 (L) 05/25/2023   MCV 87.9 05/25/2023   PLT 161 05/25/2023    Radiographic Findings: NM PET Image Restag (PS) Skull Base To Thigh Result Date: 07/16/2024 CLINICAL DATA:  Subsequent treatment strategy for non-small cell lung cancer of right upper lobe. History of right breast cancer status post lumpectomy and chemoradiation therapy. EXAM: NUCLEAR MEDICINE PET SKULL BASE TO THIGH TECHNIQUE: 9.39 mCi F-18 FDG was injected intravenously. Full-ring PET imaging was performed from the skull base to thigh after the radiotracer. CT data was obtained and used for attenuation correction and anatomic localization. Fasting blood glucose: 66 mg/dl COMPARISON:  Chest CT June 13, 2024, PET-CT May 05 2023. FINDINGS: Mediastinal blood pool activity: SUV max 2.5 Liver activity: SUV max 3 NECK: No hypermetabolic lymphadenopathy CHEST: Posttreatment changes along the anterior aspect of the right upper lobe site of known malignancy measuring 4.5 x 3.5 cm with max SUV  4.5 versus previously 4 x 3.2 cm with max SUV 8.9. Activity may correlate to residual viable disease versus post treatment/postradiation inflammatory changes. There is an additional subsolid nodule in superior segment of right lower lobe measuring 1.4 x 1.1 cm max SUV 1.1, stable to prior and new to prior PET-CT. There is an eccentric solid component measuring 5 mm along the peripheral aspect of the lesion. (4/56). Right breast lumpectomy without suspicious finding to suggest recurrent malignancy. Right axillary nodal dissection. Left breast and axilla are unremarkable. No hypermetabolic mediastinal/intramammary lymphadenopathy. ABDOMEN/PELVIS: Focal activity in the gastric antrum max SUV 6.5 without definite CT correlate, new to prior. No suspicious findings to suggest metastatic disease. Incidental CT findings: Right kidney cortical cysts with photopenia. Hysterectomy. No adnexal mass. SKELETON:  Focal metabolic activity along the subcutaneous left lower abdominal wall with max SUV 3.9 likely inflammatory/post injection. Symmetric metabolic activity of bilateral shoulder joints and bilateral first carpometacarpal joints suggestive of arthritis. No lytic or sclerotic osseous lesion. IMPRESSION: Residual metabolic activity/posttreatment changes at the site of known right apical malignancy. Residual viable disease can not be excluded. Recommend continued attention on follow-up cecum. Ill-defined ground-glass attenuation in superior segment of right lower lobe without significant FDG uptake, stable to prior chest CT and new to prior PET-CT. This finding may correlate to postradiation/posttreatment changes, versus a second adenocarcinoma spectrum primary or metastasis. Recommend close attention on follow-up according to oncologic protocols. No other findings to suggest distant metastasis or nodal disease. A focal metabolic activity in left ventral abdominal wall likely site of prior injection/inflammatory. Right  lumpectomy without hypermetabolic finding to suggest locally recurrent malignancy or axillary nodal disease. Focal metabolic activity along the gastric antrum without CT correlate possibly inflammatory. Correlate with clinical findings. Electronically Signed   By: Megan  Zare M.D.   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  ***  Plan:  ***   *** minutes of total time was spent for this patient encounter, including preparation, face-to-face counseling with the patient and coordination of care, physical exam, and documentation of the encounter. ____________________________________  Lynwood CHARM Nasuti, PhD, MD  This document serves as a record of services personally performed by Lynwood Nasuti, MD. It was created on his behalf by Reymundo Cartwright, a trained medical scribe. The creation of this record is based on the scribe's personal observations and the provider's statements to them. This document has been checked and approved by the attending provider.

## 2024-07-19 ENCOUNTER — Encounter: Payer: Self-pay | Admitting: Radiation Oncology

## 2024-07-19 ENCOUNTER — Ambulatory Visit
Admission: RE | Admit: 2024-07-19 | Discharge: 2024-07-19 | Disposition: A | Discharge status: Home / Self Care | Source: Ambulatory Visit | Attending: Radiation Oncology | Admitting: Radiation Oncology

## 2024-07-19 DIAGNOSIS — Z7984 Long term (current) use of oral hypoglycemic drugs: Secondary | ICD-10-CM | POA: Diagnosis not present

## 2024-07-19 DIAGNOSIS — Z794 Long term (current) use of insulin: Secondary | ICD-10-CM | POA: Diagnosis not present

## 2024-07-19 DIAGNOSIS — Z79899 Other long term (current) drug therapy: Secondary | ICD-10-CM | POA: Insufficient documentation

## 2024-07-19 DIAGNOSIS — N281 Cyst of kidney, acquired: Secondary | ICD-10-CM | POA: Insufficient documentation

## 2024-07-19 DIAGNOSIS — Z923 Personal history of irradiation: Secondary | ICD-10-CM | POA: Diagnosis not present

## 2024-07-19 DIAGNOSIS — Z7985 Long-term (current) use of injectable non-insulin antidiabetic drugs: Secondary | ICD-10-CM | POA: Diagnosis not present

## 2024-07-19 DIAGNOSIS — Z7982 Long term (current) use of aspirin: Secondary | ICD-10-CM | POA: Insufficient documentation

## 2024-07-19 DIAGNOSIS — Z85118 Personal history of other malignant neoplasm of bronchus and lung: Secondary | ICD-10-CM | POA: Diagnosis not present

## 2024-07-19 DIAGNOSIS — C3411 Malignant neoplasm of upper lobe, right bronchus or lung: Secondary | ICD-10-CM

## 2024-07-19 NOTE — Progress Notes (Signed)
 Carol Keith is here today for follow up post radiation to the lung.  Lung Side: Right, patient completed treatment on 07/22/23.   Does the patient complain of any of the following: Pain: No Shortness of breath w/wo exertion: No Cough: Dry Pain with swallowing: No Swallowing/choking concerns: No Appetite: fair  Energy Level: Good  Post radiation skin Changes: No    Additional comments if applicable:   BP (!) (P) 161/83 (BP Location: Left Arm, Patient Position: Sitting)   Temp (!) (P) 97.1 F (36.2 C) (Temporal)   Resp (P) 18   Ht (P) 5' 3 (1.6 m)   Wt (P) 186 lb 2 oz (84.4 kg)   SpO2 (P) 100%   BMI (P) 32.97 kg/m

## 2024-08-13 DIAGNOSIS — M79671 Pain in right foot: Secondary | ICD-10-CM | POA: Diagnosis not present

## 2024-08-13 DIAGNOSIS — M79675 Pain in left toe(s): Secondary | ICD-10-CM | POA: Diagnosis not present

## 2024-08-13 DIAGNOSIS — E1140 Type 2 diabetes mellitus with diabetic neuropathy, unspecified: Secondary | ICD-10-CM | POA: Diagnosis not present

## 2024-08-13 DIAGNOSIS — I739 Peripheral vascular disease, unspecified: Secondary | ICD-10-CM | POA: Diagnosis not present

## 2024-08-13 DIAGNOSIS — L110 Acquired keratosis follicularis: Secondary | ICD-10-CM | POA: Diagnosis not present

## 2024-08-13 DIAGNOSIS — M79672 Pain in left foot: Secondary | ICD-10-CM | POA: Diagnosis not present

## 2024-08-13 DIAGNOSIS — M79674 Pain in right toe(s): Secondary | ICD-10-CM | POA: Diagnosis not present

## 2024-08-16 ENCOUNTER — Other Ambulatory Visit: Payer: Self-pay | Admitting: Internal Medicine

## 2024-08-16 DIAGNOSIS — Z1231 Encounter for screening mammogram for malignant neoplasm of breast: Secondary | ICD-10-CM

## 2024-08-17 ENCOUNTER — Telehealth: Payer: Self-pay | Admitting: *Deleted

## 2024-08-17 NOTE — Telephone Encounter (Signed)
 Called patient to inform of Ct for 10-19-24- arrival time- 1:45 pm @ [facility], no restrictions to scan, patient to receive results from Dr. Shannon on 10-25-24 @ 11:15 am, spoke with patient and she is aware of these appts. and the instructions

## 2024-09-03 ENCOUNTER — Telehealth: Payer: Self-pay | Admitting: Radiation Oncology

## 2024-09-03 NOTE — Telephone Encounter (Signed)
 Returned call to pt's daughter regarding appt time for f/u with Dr. Shannon. Appt moved to 1/15 @ 3:45pm; appt time approved by Dr. Shannon.

## 2024-09-11 DIAGNOSIS — Z1339 Encounter for screening examination for other mental health and behavioral disorders: Secondary | ICD-10-CM | POA: Diagnosis not present

## 2024-09-11 DIAGNOSIS — R5383 Other fatigue: Secondary | ICD-10-CM | POA: Diagnosis not present

## 2024-09-11 DIAGNOSIS — Z79899 Other long term (current) drug therapy: Secondary | ICD-10-CM | POA: Diagnosis not present

## 2024-09-11 DIAGNOSIS — C3491 Malignant neoplasm of unspecified part of right bronchus or lung: Secondary | ICD-10-CM | POA: Diagnosis not present

## 2024-09-11 DIAGNOSIS — I10 Essential (primary) hypertension: Secondary | ICD-10-CM | POA: Diagnosis not present

## 2024-09-11 DIAGNOSIS — Z7189 Other specified counseling: Secondary | ICD-10-CM | POA: Diagnosis not present

## 2024-09-11 DIAGNOSIS — E559 Vitamin D deficiency, unspecified: Secondary | ICD-10-CM | POA: Diagnosis not present

## 2024-09-11 DIAGNOSIS — Z0000 Encounter for general adult medical examination without abnormal findings: Secondary | ICD-10-CM | POA: Diagnosis not present

## 2024-09-11 DIAGNOSIS — Z1331 Encounter for screening for depression: Secondary | ICD-10-CM | POA: Diagnosis not present

## 2024-09-11 DIAGNOSIS — Z299 Encounter for prophylactic measures, unspecified: Secondary | ICD-10-CM | POA: Diagnosis not present

## 2024-09-11 DIAGNOSIS — E7800 Pure hypercholesterolemia, unspecified: Secondary | ICD-10-CM | POA: Diagnosis not present

## 2024-09-14 ENCOUNTER — Ambulatory Visit

## 2024-09-26 ENCOUNTER — Ambulatory Visit

## 2024-10-15 ENCOUNTER — Ambulatory Visit (HOSPITAL_COMMUNITY)
Admission: RE | Admit: 2024-10-15 | Discharge: 2024-10-15 | Disposition: A | Discharge status: Home / Self Care | Source: Ambulatory Visit | Attending: Radiation Oncology | Admitting: Radiation Oncology

## 2024-10-15 DIAGNOSIS — C3411 Malignant neoplasm of upper lobe, right bronchus or lung: Secondary | ICD-10-CM

## 2024-10-15 MED ORDER — IOHEXOL 300 MG/ML  SOLN: 80.0000 mL | Freq: Once | INTRAMUSCULAR | Status: DC | PRN

## 2024-10-16 ENCOUNTER — Ambulatory Visit
Admission: RE | Admit: 2024-10-16 | Discharge: 2024-10-16 | Disposition: A | Source: Ambulatory Visit | Attending: Internal Medicine | Admitting: Internal Medicine

## 2024-10-16 DIAGNOSIS — Z1231 Encounter for screening mammogram for malignant neoplasm of breast: Secondary | ICD-10-CM

## 2024-10-16 LAB — POCT I-STAT CREATININE: Creatinine, Ser: 1.9 mg/dL — ABNORMAL HIGH (ref 0.44–1.00)

## 2024-10-19 ENCOUNTER — Other Ambulatory Visit (HOSPITAL_COMMUNITY)

## 2024-10-24 NOTE — Progress Notes (Signed)
 "  Radiation Oncology         [phone] ________________________________  Name: Carol Keith MRN: [id number]  Date: 10/25/2024  DOB: 19-Jul-1951  Follow-Up Visit Note  CC: Carol Isles, MD  Carol Elspeth BROCKS, MD    ICD-10-CM   1. Adenocarcinoma of upper lobe of right lung (HCC)  C34.11 CT CHEST WO CONTRAST       Diagnosis: Stage IB (cT2a, N0, M0) NSCLC, Adenocarcinoma, of the RUL; s/p SBRT completed on 07/22/2023   Interval Since Last Radiation: 1 year 3 months, 4 days   Treatment Dates: First treatment  2023-07-11 - Last Treatment Date: 2023-07-22 Intent: Curative   Site/Dose/Technique/Mode:   Plan Name: [id number] Site: Lung, Right Technique: IMRT Mode: Photon Dose Per Fraction: 5 Gy Prescribed Dose (Delivered / Prescribed): 50 Gy / 50 Gy Prescribed Fxs (Delivered / Prescribed): 10 / 10   Narrative:  The patient returns today for routine follow-up and to review recent imaging. She was last seen in office on 07/13/24 for a routine follow up. Patient continued to follow up with their specialists to manage their chronic conditions.    Most recent CT chest performed on 10/15/24 shows interval mild decrease in size of the anteromedial right upper lobe lesion along with stable subtle micro nodularity of clustered in the superior segment right lower lobe, likely sequelae of prior infection/inflammation. No new or progressive findings on exam was indicated.   Patient also underwent a bilateral screening mammogram on 10/16/24 showing no mammographic evidence of malignancy.   No other significant oncologic interval history since the patient was last seen.   Patient notes shortness of breath with exertion, which is consistent with her baseline. She denies any cough, chest pain, or other changes to her breathing.  Allergies:  is allergic to ace inhibitors, actos [pioglitazone], lipitor [atorvastatin], and metformin and related.  Meds: Current Outpatient Medications  Medication  Sig Dispense Refill   allopurinol  (ZYLOPRIM ) 100 MG tablet Take 100 mg by mouth daily.      amLODipine  (NORVASC ) 5 MG tablet Take 5 mg by mouth daily.     ascorbic acid (VITAMIN C) 500 MG tablet Take 500 mg by mouth daily.     aspirin  EC 81 MG tablet Take 81 mg by mouth daily.     bumetanide (BUMEX) 1 MG tablet Take 1 mg by mouth daily.     Camphor-Menthol -Methyl Sal (SALONPAS) 3.10-16-08 % PTCH Place 1 patch onto the skin daily as needed (pain).     cetirizine (ZYRTEC) 10 MG tablet Take 10 mg by mouth daily.     Cyanocobalamin (VITAMIN B-12) 2500 MCG SUBL Place 2,500 mcg under the tongue daily.     diclofenac sodium (VOLTAREN) 1 % GEL Apply 2 g topically 2 (two) times daily as needed (pain).     doxazosin (CARDURA XL) 4 MG 24 hr tablet Take 4 mg by mouth at bedtime.     Exenatide  ER (BYDUREON  BCISE) 2 MG/0.85ML AUIJ Inject 2 mg into the skin once a week.     glimepiride  (AMARYL ) 4 MG tablet Take 4 mg by mouth daily with breakfast.      glucose blood test strip 1 each by Other route as needed for other. Use as instructed     Insulin  Pen Needle (NOVOFINE) 32G X 6 MM MISC by Does not apply route.     loperamide  (IMODIUM ) 2 MG capsule Take 1 capsule (2 mg total) by mouth 4 (four) times daily as needed for diarrhea or loose  stools. 12 capsule 0   losartan  (COZAAR ) 100 MG tablet Take 100 mg by mouth daily.     meclizine (ANTIVERT) 25 MG tablet Take 25 mg by mouth 2 (two) times daily as needed for dizziness.     metoprolol  succinate (TOPROL -XL) 100 MG 24 hr tablet Take 100 mg by mouth daily with breakfast. Take with or immediately following a meal.      pantoprazole (PROTONIX) 40 MG tablet Take 40 mg by mouth daily before breakfast.     Polyethyl Glycol-Propyl Glycol (LUBRICANT EYE DROPS) 0.4-0.3 % SOLN Place 1 drop into both eyes 3 (three) times daily as needed (dry/irritated eyes.). Systane     polyethylene glycol (MIRALAX  / GLYCOLAX ) 17 g packet Take 17 g by mouth daily as needed (constipation.).      potassium chloride  (K-DUR) 10 MEQ tablet Take 20 mEq by mouth daily.      rosuvastatin (CRESTOR) 10 MG tablet Take 10 mg by mouth at bedtime.      tiZANidine (ZANAFLEX) 4 MG tablet Take 4 mg by mouth every 8 (eight) hours as needed for muscle spasms.      TOUJEO  SOLOSTAR 300 UNIT/ML SOPN Inject 45 Units into the skin at bedtime.     Cholecalciferol  (VITAMIN D3) 1.25 MG (50000 UT) CAPS Take 1 capsule by mouth once a week. (Patient not taking: Reported on 10/25/2024)     No current facility-administered medications for this encounter.    Physical Findings: The patient is in no acute distress. Patient is alert and oriented.  height is 5' 3 (1.6 m) and weight is 186 lb 12.8 oz (84.7 kg). Her temperature is 97.6 F (36.4 C). Her blood pressure is 162/78 (abnormal) and her pulse is 73. Her respiration is 20 and oxygen saturation is 100%. .  No significant changes. Lungs are clear to auscultation bilaterally. Heart has regular rate and rhythm. No palpable cervical, supraclavicular, or axillary adenopathy. Abdomen soft, non-tender, normal bowel sounds.   Lab Findings: Lab Results  Component Value Date   WBC 4.4 05/25/2023   HGB 10.4 (L) 05/25/2023   HCT 32.1 (L) 05/25/2023   MCV 87.9 05/25/2023   PLT 161 05/25/2023    Radiographic Findings: CT CHEST WO CONTRAST Result Date: 10/21/2024 CLINICAL DATA:  Non-small-cell lung cancer. Restaging. Insert body on EXAM: CT CHEST WITHOUT CONTRAST TECHNIQUE: Multidetector CT imaging of the chest was performed following the standard protocol without IV contrast. RADIATION DOSE REDUCTION: This exam was performed according to the departmental dose-optimization program which includes automated exposure control, adjustment of the mA and/or kV according to patient size and/or use of iterative reconstruction technique. COMPARISON:  PET-CT 07/13/2024.  Chest CT 06/13/2024. FINDINGS: Cardiovascular: The heart size is normal. No substantial pericardial effusion. No  thoracic aortic aneurysm. Mediastinum/Nodes: No mediastinal lymphadenopathy. No evidence for gross hilar lymphadenopathy although assessment is limited by the lack of intravenous contrast on the current study. The esophagus has normal imaging features. There is no axillary lymphadenopathy. Surgical clips are noted in the right axillary region. Lungs/Pleura: Anteromedial right upper lobe lesion measured previously at 3.5 x 2.8 cm is 3.1 x 2.4 cm today on image 33/4. Subtle micro nodularity of clustered in the superior segment right lower lobe on image 34/4 is stable. Subpleural reticulation anterior right lung suggest previous radiation therapy. No new suspicious pulmonary nodule or mass. No focal airspace consolidation. There is no evidence of pleural effusion. Upper Abdomen: Visualized portion of the upper abdomen shows no acute findings. Musculoskeletal: No worrisome lytic  or sclerotic osseous abnormality. IMPRESSION: 1. Interval mild decrease in size of the anteromedial right upper lobe lesion. 2. Stable subtle micro nodularity of clustered in the superior segment right lower lobe, likely sequelae of prior infection/inflammation. 3. No new or progressive findings on today's study. Electronically Signed   By: Camellia Candle M.D.   On: 10/21/2024 06:41   MM 3D SCREENING MAMMOGRAM BILATERAL BREAST Result Date: 10/18/2024 CLINICAL DATA:  Screening. EXAM: DIGITAL SCREENING BILATERAL MAMMOGRAM WITH TOMOSYNTHESIS AND CAD TECHNIQUE: Bilateral screening digital craniocaudal and mediolateral oblique mammograms were obtained. Bilateral screening digital breast tomosynthesis was performed. The images were evaluated with computer-aided detection. COMPARISON:  Previous exam(s). ACR Breast Density Category c: The breasts are heterogeneously dense, which may obscure small masses. FINDINGS: Right breast post-lumpectomy changes. No findings suspicious for malignancy in either breast. IMPRESSION: No mammographic evidence of  malignancy. A result letter of this screening mammogram will be mailed directly to the patient. RECOMMENDATION: Screening mammogram in one year. (Code:SM-B-01Y) BI-RADS CATEGORY  2: Benign. Electronically Signed   By: Curtistine Noble   On: 10/18/2024 12:55    Impression/Plan:   Stage IB (cT2a, N0, M0) NSCLC, Adenocarcinoma, of the RUL; s/p SBRT completed on 07/22/2023  No evidence of recurrence on clinical exam today. We personally reviewed the results of her most recent CT scan. The previously treated RUL has mildly decreased in size, indicating treatment response. There was a stable cluster of nodularity in the RLL, attributed to likely be a sequelae of prior infection/inflammation.   Per NCCN guidelines, we will get a follow up chest CT in 6 months. Routine office visit 1-2 days after scan to review results and for physical examination. Patient was encouraged to call with any questions or concerns in the meantime.  Elevated Creatinine   Creatinine obtained by our department for imaging purposes in October was elevated at 1.9. Patient is seeing her PCP next week and voiced that she would bring this to his attention at that time. Dr. Maree is in the [facility] network and will have access to our records.    20 minutes of total time was spent for this patient encounter, including preparation, face-to-face counseling with the patient and coordination of care, physical exam, and documentation of the encounter. ____________________________________    Leeroy Due, PA-C   This document serves as a record of services personally performed by Leeroy Due, PA-C. It was created on her behalf by Reymundo Cartwright, a trained medical scribe. The creation of this record is based on the scribe's personal observations and the provider's statements to them. This document has been checked and approved by the attending provider.  "

## 2024-10-25 ENCOUNTER — Ambulatory Visit
Admission: RE | Admit: 2024-10-25 | Discharge: 2024-10-25 | Disposition: A | Discharge status: Home / Self Care | Payer: Self-pay | Source: Ambulatory Visit | Attending: Radiation Oncology | Admitting: Radiation Oncology

## 2024-10-25 ENCOUNTER — Encounter: Payer: Self-pay | Admitting: Radiation Oncology

## 2024-10-25 VITALS — BP 162/78 | HR 73 | Temp 97.6°F | Resp 20 | Ht 63.0 in | Wt 186.8 lb

## 2024-10-25 DIAGNOSIS — Z7984 Long term (current) use of oral hypoglycemic drugs: Secondary | ICD-10-CM | POA: Insufficient documentation

## 2024-10-25 DIAGNOSIS — Z923 Personal history of irradiation: Secondary | ICD-10-CM | POA: Diagnosis not present

## 2024-10-25 DIAGNOSIS — Z85118 Personal history of other malignant neoplasm of bronchus and lung: Secondary | ICD-10-CM | POA: Diagnosis present

## 2024-10-25 DIAGNOSIS — Z7985 Long-term (current) use of injectable non-insulin antidiabetic drugs: Secondary | ICD-10-CM | POA: Insufficient documentation

## 2024-10-25 DIAGNOSIS — Z79899 Other long term (current) drug therapy: Secondary | ICD-10-CM | POA: Insufficient documentation

## 2024-10-25 DIAGNOSIS — Z794 Long term (current) use of insulin: Secondary | ICD-10-CM | POA: Insufficient documentation

## 2024-10-25 DIAGNOSIS — Z7982 Long term (current) use of aspirin: Secondary | ICD-10-CM | POA: Diagnosis not present

## 2024-10-25 DIAGNOSIS — R7989 Other specified abnormal findings of blood chemistry: Secondary | ICD-10-CM | POA: Insufficient documentation

## 2024-10-25 DIAGNOSIS — C3411 Malignant neoplasm of upper lobe, right bronchus or lung: Secondary | ICD-10-CM

## 2024-10-25 NOTE — Progress Notes (Signed)
 Carol Keith is here today for follow up post radiation to the lung and to receive CT scan results.  Lung Side: Right, patient completed treatment on 07/22/23.  Does the patient complain of any of the following: Pain: No  Shortness of breath w/wo exertion:  Yes, occasionally on exertion.  Cough: Yes, occasional dry cough.  Hemoptysis: No Pain with swallowing: No Swallowing/choking concerns: No Appetite: fair  Energy Level: good  Post radiation skin Changes: Reports dry skin to chest and back.     Additional comments if applicable:   BP (!) 162/78 (BP Location: Left Arm, Patient Position: Sitting, Cuff Size: Large)   Pulse 73   Temp 97.6 F (36.4 C)   Resp 20   Ht 5' 3 (1.6 m)   Wt 186 lb 12.8 oz (84.7 kg)   SpO2 100%   BMI 33.09 kg/m

## 2024-11-14 ENCOUNTER — Telehealth: Payer: Self-pay | Admitting: *Deleted

## 2024-11-14 NOTE — Telephone Encounter (Signed)
 CALLED PATIENT TO INFORM OF CT FOR 04-24-25- ARRIVAL TIME- 12:45 PM @ [place] [facility], NO RESTRICTIONS TO SCAN, PATIENT TO RECEIVE RESULTS FROM ELLIE MUIR ON 04-30-25 @ 11 AM, SPOKE WITH PATIENT AND SHE IS AWARE OF THESE APPTS. AND THE INSTRUCTIONS

## 2024-11-14 NOTE — Telephone Encounter (Signed)
 RETURNED PATIENT'S DAUGHTER'S (TONI) PHONE CALL, SPOKE WITH PATIENT'S DAUGHTER

## 2025-04-24 ENCOUNTER — Other Ambulatory Visit (HOSPITAL_COMMUNITY)

## 2025-04-30 ENCOUNTER — Ambulatory Visit: Admitting: Radiology
